# Patient Record
Sex: Female | Born: 1950 | Race: White | Hispanic: No | Smoking: Never smoker
Health system: Southern US, Community
[De-identification: ages and names within clinical notes are randomized; demographics above are authoritative.]

## PROBLEM LIST (undated history)

## (undated) DIAGNOSIS — C801 Malignant (primary) neoplasm, unspecified: Secondary | ICD-10-CM

## (undated) HISTORY — DX: Malignant (primary) neoplasm, unspecified: C80.1

## (undated) HISTORY — PX: ABDOMINAL HYSTERECTOMY: SHX81

## (undated) HISTORY — PX: KNEE ARTHROSCOPY: SUR90

## (undated) HISTORY — PX: OTHER SURGICAL HISTORY: SHX169

---

## 2003-12-22 ENCOUNTER — Encounter: Payer: Self-pay | Admitting: Family Medicine

## 2005-09-10 ENCOUNTER — Ambulatory Visit: Payer: Self-pay | Admitting: Family Medicine

## 2005-09-16 ENCOUNTER — Ambulatory Visit: Payer: Self-pay | Admitting: Family Medicine

## 2005-09-23 ENCOUNTER — Ambulatory Visit: Payer: Self-pay | Admitting: Family Medicine

## 2005-10-01 ENCOUNTER — Ambulatory Visit: Payer: Self-pay | Admitting: Family Medicine

## 2005-11-02 ENCOUNTER — Ambulatory Visit: Payer: Self-pay | Admitting: Family Medicine

## 2005-11-24 ENCOUNTER — Ambulatory Visit: Payer: Self-pay | Admitting: Family Medicine

## 2006-01-04 ENCOUNTER — Ambulatory Visit: Payer: Self-pay | Admitting: Family Medicine

## 2006-01-21 ENCOUNTER — Ambulatory Visit: Payer: Self-pay | Admitting: Family Medicine

## 2006-01-29 ENCOUNTER — Ambulatory Visit: Payer: Self-pay | Admitting: Family Medicine

## 2006-04-07 ENCOUNTER — Ambulatory Visit: Payer: Self-pay | Admitting: Family Medicine

## 2006-05-21 ENCOUNTER — Ambulatory Visit: Payer: Self-pay | Admitting: Family Medicine

## 2006-06-11 ENCOUNTER — Ambulatory Visit: Payer: Self-pay | Admitting: Family Medicine

## 2006-06-17 ENCOUNTER — Ambulatory Visit: Payer: Self-pay | Admitting: Family Medicine

## 2006-06-21 ENCOUNTER — Ambulatory Visit: Payer: Self-pay | Admitting: Family Medicine

## 2006-07-05 ENCOUNTER — Ambulatory Visit: Payer: Self-pay | Admitting: Family Medicine

## 2006-09-28 DIAGNOSIS — F339 Major depressive disorder, recurrent, unspecified: Secondary | ICD-10-CM | POA: Insufficient documentation

## 2006-09-28 DIAGNOSIS — R5382 Chronic fatigue, unspecified: Secondary | ICD-10-CM | POA: Insufficient documentation

## 2006-09-28 DIAGNOSIS — G9332 Myalgic encephalomyelitis/chronic fatigue syndrome: Secondary | ICD-10-CM | POA: Insufficient documentation

## 2006-09-28 DIAGNOSIS — R55 Syncope and collapse: Secondary | ICD-10-CM | POA: Insufficient documentation

## 2006-09-28 DIAGNOSIS — N951 Menopausal and female climacteric states: Secondary | ICD-10-CM | POA: Insufficient documentation

## 2006-09-28 DIAGNOSIS — F329 Major depressive disorder, single episode, unspecified: Secondary | ICD-10-CM

## 2006-09-28 DIAGNOSIS — I10 Essential (primary) hypertension: Secondary | ICD-10-CM | POA: Insufficient documentation

## 2007-06-17 ENCOUNTER — Encounter: Payer: Self-pay | Admitting: Family Medicine

## 2007-07-07 LAB — CONVERTED CEMR LAB
HDL: 48 mg/dL
Triglycerides: 139 mg/dL

## 2007-08-19 ENCOUNTER — Ambulatory Visit: Payer: Self-pay | Admitting: Family Medicine

## 2007-08-19 DIAGNOSIS — E785 Hyperlipidemia, unspecified: Secondary | ICD-10-CM | POA: Insufficient documentation

## 2007-08-29 ENCOUNTER — Telehealth: Payer: Self-pay | Admitting: Family Medicine

## 2007-08-29 ENCOUNTER — Ambulatory Visit: Payer: Self-pay | Admitting: Family Medicine

## 2007-08-29 DIAGNOSIS — R1013 Epigastric pain: Secondary | ICD-10-CM | POA: Insufficient documentation

## 2007-08-30 ENCOUNTER — Encounter: Payer: Self-pay | Admitting: Family Medicine

## 2007-08-30 LAB — CONVERTED CEMR LAB
Amylase: 76 units/L (ref 0–105)
BUN: 23 mg/dL (ref 6–23)
CO2: 23 meq/L (ref 19–32)
Calcium: 9.5 mg/dL (ref 8.4–10.5)
Chloride: 103 meq/L (ref 96–112)
Creatinine, Ser: 0.8 mg/dL (ref 0.40–1.20)
Hemoglobin: 14.9 g/dL (ref 12.0–15.0)
Lymphocytes Relative: 28 % (ref 12–46)
MCHC: 31.8 g/dL (ref 30.0–36.0)
Monocytes Absolute: 0.8 10*3/uL — ABNORMAL HIGH (ref 0.2–0.7)
Monocytes Relative: 7 % (ref 3–11)
Neutro Abs: 6.9 10*3/uL (ref 1.7–7.7)
RBC: 5.08 M/uL (ref 3.87–5.11)

## 2007-09-13 ENCOUNTER — Ambulatory Visit: Payer: Self-pay | Admitting: Family Medicine

## 2007-09-13 DIAGNOSIS — R42 Dizziness and giddiness: Secondary | ICD-10-CM | POA: Insufficient documentation

## 2007-09-14 ENCOUNTER — Encounter: Payer: Self-pay | Admitting: Family Medicine

## 2007-09-15 ENCOUNTER — Telehealth: Payer: Self-pay | Admitting: Family Medicine

## 2007-09-15 ENCOUNTER — Encounter: Payer: Self-pay | Admitting: Family Medicine

## 2008-01-26 ENCOUNTER — Ambulatory Visit: Payer: Self-pay | Admitting: Family Medicine

## 2008-01-26 LAB — CONVERTED CEMR LAB: Glucose, Bld: 83 mg/dL

## 2008-04-25 ENCOUNTER — Ambulatory Visit: Payer: Self-pay | Admitting: Family Medicine

## 2008-05-31 ENCOUNTER — Telehealth: Payer: Self-pay | Admitting: Family Medicine

## 2008-08-22 ENCOUNTER — Telehealth: Payer: Self-pay | Admitting: Family Medicine

## 2008-08-29 ENCOUNTER — Other Ambulatory Visit: Admission: RE | Admit: 2008-08-29 | Discharge: 2008-08-29 | Payer: Self-pay | Admitting: Family Medicine

## 2008-08-29 ENCOUNTER — Encounter: Payer: Self-pay | Admitting: Family Medicine

## 2008-08-29 ENCOUNTER — Ambulatory Visit: Payer: Self-pay | Admitting: Family Medicine

## 2008-08-29 DIAGNOSIS — N950 Postmenopausal bleeding: Secondary | ICD-10-CM | POA: Insufficient documentation

## 2008-08-31 LAB — CONVERTED CEMR LAB: Pap Smear: NORMAL

## 2008-09-05 ENCOUNTER — Telehealth: Payer: Self-pay | Admitting: Family Medicine

## 2009-05-13 ENCOUNTER — Ambulatory Visit: Payer: Self-pay | Admitting: Family Medicine

## 2009-05-13 DIAGNOSIS — G4700 Insomnia, unspecified: Secondary | ICD-10-CM | POA: Insufficient documentation

## 2010-03-05 ENCOUNTER — Ambulatory Visit: Payer: Self-pay | Admitting: Family Medicine

## 2010-10-13 ENCOUNTER — Encounter: Admission: RE | Admit: 2010-10-13 | Discharge: 2010-10-13 | Payer: Self-pay | Admitting: Family Medicine

## 2010-10-13 ENCOUNTER — Ambulatory Visit: Payer: Self-pay | Admitting: Family Medicine

## 2010-10-13 DIAGNOSIS — M79609 Pain in unspecified limb: Secondary | ICD-10-CM | POA: Insufficient documentation

## 2010-12-30 ENCOUNTER — Ambulatory Visit
Admission: RE | Admit: 2010-12-30 | Discharge: 2010-12-30 | Payer: Self-pay | Source: Home / Self Care | Attending: Family Medicine | Admitting: Family Medicine

## 2011-01-20 NOTE — Assessment & Plan Note (Signed)
Summary: HTN, Mood   Vital Signs:  Patient profile:   60 year old [place] Height:      64.75 inches Weight:      182 pounds BMI:     30.63 Pulse rate:   89 / minute BP sitting:   147 / 85  (left arm) Cuff size:   large  Vitals Entered By: Kathlene November (March 05, 2010 9:16 AM) zCC: followup BP   Primary Care Provider:  Linford Arnold, C  CC:  followup BP.  History of Present Illness: Feels stressed and in a "funk". Sleep is OK. Plans on visiting a friend for about 6 months in [place]. Says stayed with the freind after her divorce and it really helped her spirts. She has been unable to find  a job for the last year and this has been stressful as well. Feels overwhelmed right now. Denis any suicidal or homicidal thoughts. Has been having more frequent HA over teh last month. Treating with Tyelnol. Had to take and excedrin yesterday but it did help.  sometimes will wake up with a HA. Usually frontal and throbbing.   Current Medications (verified): 1)  Lisinopril 20 Mg  Tabs (Lisinopril) .... Take 1 Tablet By Mouth Once A Day 2)  Metoprolol Tartrate 25 Mg  Tabs (Metoprolol Tartrate) .... Take 1 Tablet By Mouth Two Times A Day  Allergies (verified): No Known Drug Allergies  Comments:  Nurse/Medical Assistant: The patient's medications and allergies were reviewed with the patient and were updated in the Medication and Allergy Lists. Kathlene November (March 05, 2010 9:17 AM)  Physical Exam  General:  Well-developed,well-nourished,in no acute distress; alert,appropriate and cooperative throughout examination Head:  Normocephalic and atraumatic without obvious abnormalities. No apparent alopecia or balding. Lungs:  Normal respiratory effort, chest expands symmetrically. Lungs are clear to auscultation, no crackles or wheezes. Heart:  Normal rate and regular rhythm. S1 and S2 normal without gallop, murmur, click, rub or other extra sounds. No carotid bruits.  Skin:  no rashes.   Cervical  Nodes:  No lymphadenopathy noted Psych:  Cognition and judgment appear intact. Alert and cooperative with normal attention span and concentration. No apparent delusions, illusions, hallucinations   Impression & Recommendations:  Problem # 1:  HYPERTENSION, BENIGN SYSTEMIC (ICD-401.1) Assessment Deteriorated Not well controlled today and may be contributing to her HA so will increase her dose of lisinopril. Depressiin and stress may be contributing to her HA as well. Encourage more regular exercise for her BP and her mood.  Her updated medication list for this problem includes:    Lisinopril 40 Mg Tabs (Lisinopril) .Marland Kitchen... Take 1 tablet by mouth once a day    Metoprolol Tartrate 25 Mg Tabs (Metoprolol tartrate) .Marland Kitchen... Take 1 tablet by mouth two times a day  Problem # 2:  DEPRESSIVE DISORDER, NOS (ICD-311) Assessment: Deteriorated PHQ-9 score is 21 (severe). Dsicussed tx options. Will start with an SSRI. WArned of potential SE. Call if any concerns. She will be living out of town for 6 months so asked her to call in 3 weeks to give me an update on how she is doing overall and to f/u with me when she gets back in town.  May need dose adjusted.  Mood questionnair screen in negative.  The following medications were removed from the medication list:    Alprazolam 0.5 Mg Tabs (Alprazolam) .Marland Kitchen... Take 1 tablet by mouth once a day as needed anxiety    Trazodone Hcl 50 Mg Tabs (Trazodone hcl) .Marland Kitchen... 1/2-1  tab at bedtime by mouth. Her updated medication list for this problem includes:    Citalopram Hydrobromide 20 Mg Tabs (Citalopram hydrobromide) .Marland Kitchen... 1/2 tab by mouth for 1 week, then incrase to whole tab.  Complete Medication List: 1)  Lisinopril 40 Mg Tabs (Lisinopril) .... Take 1 tablet by mouth once a day 2)  Metoprolol Tartrate 25 Mg Tabs (Metoprolol tartrate) .... Take 1 tablet by mouth two times a day 3)  Citalopram Hydrobromide 20 Mg Tabs (Citalopram hydrobromide) .... 1/2 tab by mouth for 1 week,  then incrase to whole tab. Prescriptions: CITALOPRAM HYDROBROMIDE 20 MG TABS (CITALOPRAM HYDROBROMIDE) 1/2 tab by mouth for 1 week, then incrase to whole tab.  #30 x 1   Entered and Authorized by:   Nani Gasser MD   Signed by:   Nani Gasser MD on 03/05/2010   Method used:   Electronically to        [facility] [phone]* (retail)       [address] [place], [place]  [address]       Ph: [phone]       Fax: [phone]   RxID:   [id number] METOPROLOL TARTRATE 25 MG  TABS (METOPROLOL TARTRATE) Take 1 tablet by mouth two times a day  #90 x 2   Entered and Authorized by:   Nani Gasser MD   Signed by:   Nani Gasser MD on 03/05/2010   Method used:   Electronically to        [facility] [phone]* (retail)       [address]       [place], [place]  [address]       Ph: [phone]       Fax: [phone]   RxID:   [id number] LISINOPRIL 40 MG TABS (LISINOPRIL) Take 1 tablet by mouth once a day  #90 x 2   Entered and Authorized by:   Nani Gasser MD   Signed by:   Nani Gasser MD on 03/05/2010   Method used:   Electronically to        [facility] [phone]* (retail)       [address]       [place], [place]  [address]       Ph: [phone]       Fax: [phone]   RxID:   [id number]

## 2011-01-20 NOTE — Letter (Signed)
Summary: Depression Questionnaire/[facility]  Depression Questionnaire/[facility]   Imported By: Lanelle Bal 03/11/2010 09:56:35  _____________________________________________________________________  External Attachment:    Type:   Image     Comment:   External Document

## 2011-01-20 NOTE — Assessment & Plan Note (Signed)
Summary: Lt. arm hurts and can't move it   Vital Signs:  Patient profile:   60 year old female Height:      64.75 inches Weight:      181 pounds Pulse rate:   75 / minute BP sitting:   164 / 92  (right arm) Cuff size:   regular  Vitals Entered By: Avon Gully CMA, Duncan Dull) (October 13, 2010 11:40 AM) CC: left arm pain started hurting Friday progressivly got worse through the weekend, cant straighten   Primary Care Provider:  Linford Arnold, C  CC:  left arm pain started hurting Friday progressivly got worse through the weekend and cant straighten.  History of Present Illness: left arm pain started hurting Friday progressivly got worse through the weekend, cant straighten. Using some Bengay on it on Friday. then o Sat used Icey hot. It has gotten progressively worsel.  Tried heat nad ice . Also used Excedrin 2 last night adn 2 this AM.  - not hleping.  Doesn't feel like a pulled muscle. No trauma, lifting etc.  Worse if lays on her back or side.  No numbness or tingling in the hand. Pain on outside of arm between the shoulder and elbow. No neck pain. In the poast has had occ pain but usually the bengay works.   Current Medications (verified): 1)  Lisinopril 40 Mg Tabs (Lisinopril) .... Take 1 Tablet By Mouth Once A Day 2)  Metoprolol Tartrate 25 Mg  Tabs (Metoprolol Tartrate) .... Take 1 Tablet By Mouth Two Times A Day 3)  Citalopram Hydrobromide 20 Mg Tabs (Citalopram Hydrobromide) .... 1/2 Tab By Mouth For 1 Week, Then Incrase To Whole Tab.  Allergies (verified): No Known Drug Allergies  Comments:  Nurse/Medical Assistant: The patient's medications and allergies were reviewed with the patient and were updated in the Medication and Allergy Lists. Avon Gully CMA, Duncan Dull) (October 13, 2010 11:41 AM)  Physical Exam  General:  Well-developed,well-nourished,in no acute distress; alert,appropriate and cooperative throughout examination Msk:  Slight tender over the upper trap. NEck  wtih NROM.  Hand and wrist with NROM. Thumb and first finger strength 5/5.  Has her arm flexed over her abdomen.  She is able to slighly shrug her left shoulder. Unable to abduct or adduct. Able to extend elbow about 20 degrees from a flexed 90 degree position. She is nontender over her Left humerus.   Pulses:  Raidla pulse on the left 2+  Skin:  Intact without suspicious lesions or rashes Psych:  Cognition and judgment appear intact. Alert and cooperative with normal attention span and concentration. No apparent delusions, illusions, hallucinations   Impression & Recommendations:  Problem # 1:  ARM PAIN, LEFT (ICD-729.5)  Unclear etiology. Her pain is severe and her mobility is very limited. Consider bone tumor vs tendon rupture /straing vs pain radiating from her shoulder or elbow. Her exam is so limited because of pain that it is really not helpful. Will get xray and trial of NSAIDs and muscle relaxers. Also givne sling for support since her arm raised across her abdomen is the most comfortable position.  Orders: T-DG Humerus*L* (16109) Slings- All Types (U0454) Admin of Therapeutic Inj  intramuscular or subcutaneous (09811) Ketorolac-Toradol 15mg  (B1478)  Complete Medication List: 1)  Lisinopril 40 Mg Tabs (Lisinopril) .... Take 1 tablet by mouth once a day 2)  Metoprolol Tartrate 25 Mg Tabs (Metoprolol tartrate) .... Take 1 tablet by mouth two times a day 3)  Citalopram Hydrobromide 20 Mg Tabs (Citalopram hydrobromide) .Marland KitchenMarland KitchenMarland Kitchen  1/2 tab by mouth for 1 week, then incrase to whole tab. 4)  Flexeril 10 Mg Tabs (Cyclobenzaprine hcl) .... 1/2 to 1 tab by mouth three times a day as needed muscle spasm. 5)  Naproxen 500 Mg Tabs (Naproxen) .... Take 1 tablet by mouth two times a day  Patient Instructions: 1)  Trial of antiinflammatory and muscle relaxers.  2)  Use the sling for support 3)  We will call you with the xray results.  Prescriptions: NAPROXEN 500 MG TABS (NAPROXEN) Take 1 tablet by  mouth two times a day  #60 x 0   Entered and Authorized by:   Nani Gasser MD   Signed by:   Nani Gasser MD on 10/13/2010   Method used:   Electronically to        [facility] [phone]* (retail)       [address]       [place], [place]  [address]       Ph: [phone]       Fax: [phone]   RxID:   [id number] FLEXERIL 10 MG TABS (CYCLOBENZAPRINE HCL) 1/2 to 1 tab by mouth three times a day as needed muscle spasm.  #30 x 0   Entered and Authorized by:   Nani Gasser MD   Signed by:   Nani Gasser MD on 10/13/2010   Method used:   Electronically to        [facility] [phone]* (retail)       [address]       [place], [place]  [address]       Ph: [phone]       Fax: [phone]   RxID:   [id number]    Medication Administration  Injection # 1:    Medication: Ketorolac-Toradol 15mg     Diagnosis: ARM PAIN, LEFT (ICD-729.5)    Route: IM    Site: RUOQ gluteus    Exp Date: 02/19/2012    Lot #: [id number]    Mfr: hospira    Comments: 60mg /ml given    Patient tolerated injection without complications    Given by: Avon Gully CMA, Duncan Dull) (October 13, 2010 12:05 PM)  Orders Added: 1)  T-DG Humerus*L* [73060] 2)  Est. Patient Level III [93235] 3)  Slings- All Types [A4565] 4)  Admin of Therapeutic Inj  intramuscular or subcutaneous [96372] 5)  Ketorolac-Toradol 15mg  [J1885]

## 2011-01-22 NOTE — Assessment & Plan Note (Signed)
Summary: Mole Removal Under Rt arm   Vital Signs:  Patient profile:   60 year old female Height:      64.75 inches Weight:      179 pounds Pulse rate:   90 / minute BP sitting:   173 / 96  (right arm) Cuff size:   regular  Vitals Entered By: Avon Gully CMA, (AAMA) (December 30, 2010 11:13 AM) CC: mole oor skin tag removed under rt axilla   CC:  mole oor skin tag removed under rt axilla.  Current Medications (verified): 1)  Lisinopril 40 Mg Tabs (Lisinopril) .... Take 1 Tablet By Mouth Once A Day 2)  Metoprolol Tartrate 25 Mg  Tabs (Metoprolol Tartrate) .... Take 1 Tablet By Mouth Two Times A Day 3)  Citalopram Hydrobromide 20 Mg Tabs (Citalopram Hydrobromide) .... 1/2 Tab By Mouth For 1 Week, Then Incrase To Whole Tab.  Allergies (verified): No Known Drug Allergies  Comments:  Nurse/Medical Assistant: The patient's medications and allergies were reviewed with the patient and were updated in the Medication and Allergy Lists. Avon Gully CMA, Duncan Dull) (December 30, 2010 11:14 AM)   Impression & Recommendations:  Problem # 1:  HYPERTENSION, BENIGN SYSTEMIC (ICD-401.1) She is extremely nervous. Home BPs running in the mid 130s and she has brought inher home machine and it is accurate. I think the high BP is just from being nervous today.  Her updated medication list for this problem includes:    Lisinopril 40 Mg Tabs (Lisinopril) .Marland Kitchen... Take 1 tablet by mouth once a day    Metoprolol Tartrate 25 Mg Tabs (Metoprolol tartrate) .Marland Kitchen... Take 1 tablet by mouth two times a day  Complete Medication List: 1)  Lisinopril 40 Mg Tabs (Lisinopril) .... Take 1 tablet by mouth once a day 2)  Metoprolol Tartrate 25 Mg Tabs (Metoprolol tartrate) .... Take 1 tablet by mouth two times a day 3)  Citalopram Hydrobromide 20 Mg Tabs (Citalopram hydrobromide) .... 1/2 tab by mouth for 1 week, then incrase to whole tab.  Other Orders: Skin Tags Up to 15 any area (11200)   Orders Added: 1)   Skin Tags Up to 15 any area [11200]    Procedure Note  Skin Tag Removal: Indication: inflamed lesion  Procedure # 1: skin tag removal    Location: right axilla    # lesions removed: 1    Instrument used: #10 blade.     Anesthesia: 1% lidocaine w/epinephrine    Comment: HIt the skin tag when picked her dog up. It ha been very swollen, tender and has turned black since then. I did use electrocautery to acheive hemostasis.   Cleaned and prepped with: alcohol and betadine Wound dressing: bacitracin and pressure dressing

## 2011-03-09 ENCOUNTER — Ambulatory Visit (INDEPENDENT_AMBULATORY_CARE_PROVIDER_SITE_OTHER): Payer: Self-pay | Admitting: Family Medicine

## 2011-03-09 ENCOUNTER — Encounter: Payer: Self-pay | Admitting: Family Medicine

## 2011-03-09 DIAGNOSIS — I10 Essential (primary) hypertension: Secondary | ICD-10-CM

## 2011-03-19 NOTE — Assessment & Plan Note (Signed)
Summary: HTN   Vital Signs:  Patient profile:   60 year old female Height:      64.75 inches Weight:      180 pounds Pulse rate:   77 / minute BP sitting:   159 / 91  (right arm) Cuff size:   regular  Vitals Entered By: Avon Gully CMA, Duncan Dull) (March 09, 2011 8:59 AM) CC: F/u BP, not taking meds, Hypertension Management   Primary Care Provider:  Linford Arnold, C  CC:  F/u BP, not taking meds, and Hypertension Management.  History of Present Illness: Last week felt bad at [facility]. Got a severe HA and felt bad. Went home and checked BP and it was 180/105. Stopped her BP meds last summer becuase she felt good. Still has th eold bottles with her today.   Hypertension History:      She denies headache, chest pain, palpitations, dyspnea with exertion, orthopnea, PND, peripheral edema, visual symptoms, neurologic problems, syncope, and side effects from treatment.  She notes no problems with any antihypertensive medication side effects.        Positive major cardiovascular risk factors include female age 67 years old or older, hyperlipidemia, and hypertension.  Negative major cardiovascular risk factors include negative family history for ischemic heart disease and non-tobacco-user status.        Further assessment for target organ damage reveals no history of ASHD, cardiac end-organ damage (CHF/LVH), stroke/TIA, peripheral vascular disease, renal insufficiency, or hypertensive retinopathy.     Current Medications (verified): 1)  Lisinopril 40 Mg Tabs (Lisinopril) .... Take 1 Tablet By Mouth Once A Day 2)  Metoprolol Tartrate 25 Mg  Tabs (Metoprolol Tartrate) .... Take 1 Tablet By Mouth Two Times A Day 3)  Citalopram Hydrobromide 20 Mg Tabs (Citalopram Hydrobromide) .... 1/2 Tab By Mouth For 1 Week, Then Incrase To Whole Tab.  Allergies (verified): No Known Drug Allergies  Comments:  Nurse/Medical Assistant: The patient's medications and allergies were reviewed with the patient and  were updated in the Medication and Allergy Lists. Avon Gully CMA, Duncan Dull) (March 09, 2011 9:00 AM)  Physical Exam  General:  Well-developed,well-nourished,in no acute distress; alert,appropriate and cooperative throughout examination Lungs:  Normal respiratory effort, chest expands symmetrically. Lungs are clear to auscultation, no crackles or wheezes. Heart:  Normal rate and regular rhythm. S1 and S2 normal without gallop, murmur, click, rub or other extra sounds.   Impression & Recommendations:  Problem # 1:  HYPERTENSION, BENIGN SYSTEMIC (ICD-401.1) BP is still high. Restart medications. Discussed why it is important to take medication to control BP. She agrees to restart. Note she is without insurance right now.  Her updated medication list for this problem includes:    Lisinopril 40 Mg Tabs (Lisinopril) .Marland Kitchen... Take 1 tablet by mouth once a day    Metoprolol Tartrate 25 Mg Tabs (Metoprolol tartrate) .Marland Kitchen... Take 1 tablet by mouth two times a day  Complete Medication List: 1)  Lisinopril 40 Mg Tabs (Lisinopril) .... Take 1 tablet by mouth once a day 2)  Metoprolol Tartrate 25 Mg Tabs (Metoprolol tartrate) .... Take 1 tablet by mouth two times a day  Hypertension Assessment/Plan:      The patient's hypertensive risk group is category B: At least one risk factor (excluding diabetes) with no target organ damage.  Her calculated 10 year risk of coronary heart disease is 15 %.  Today's blood pressure is 159/91.  Her blood pressure goal is < 140/90.  Patient Instructions: 1)  Call me in  one month with BP readings 2)  Start the lisinopirl two times a day for one week. Then add the metoprolol two times a day to your regimen.   3)  Then f/u with me in about 2-3 months.   Orders Added: 1)  Est. Patient Level II [04540]    Prevention & Chronic Care Immunizations   Influenza vaccine: Not documented    Tetanus booster: Not documented    Pneumococcal vaccine: Not documented    H.  zoster vaccine: Not documented  Colorectal Screening   Hemoccult: Not documented    Colonoscopy: Not documented  Other Screening   Pap smear: Normal  (08/31/2008)   Pap smear due: 08/2010    Mammogram: Not documented    DXA bone density scan: Not documented   Smoking status: never  (06/17/2007)  Lipids   Total Cholesterol: 232  (07/07/2007)   LDL: 156  (07/07/2007)   LDL Direct: Not documented   HDL: 48  (07/07/2007)   Triglycerides: 139  (07/07/2007)    SGOT (AST): 15  (08/30/2007)   SGPT (ALT): 16  (08/30/2007)   Alkaline phosphatase: 89  (08/30/2007)   Total bilirubin: 0.4  (08/30/2007)  Hypertension   Last Blood Pressure: 159 / 91  (03/09/2011)   Serum creatinine: 0.80  (08/30/2007)   Serum potassium 4.5  (08/30/2007)  Self-Management Support :    Hypertension self-management support: Not documented    Lipid self-management support: Not documented

## 2011-11-09 ENCOUNTER — Telehealth: Payer: Self-pay | Admitting: *Deleted

## 2011-11-10 ENCOUNTER — Ambulatory Visit (INDEPENDENT_AMBULATORY_CARE_PROVIDER_SITE_OTHER): Payer: Self-pay | Admitting: Family Medicine

## 2011-11-10 ENCOUNTER — Encounter: Payer: Self-pay | Admitting: Family Medicine

## 2011-11-10 VITALS — BP 159/90 | HR 86 | Ht 65.0 in | Wt 175.0 lb

## 2011-11-10 DIAGNOSIS — F329 Major depressive disorder, single episode, unspecified: Secondary | ICD-10-CM

## 2011-11-10 DIAGNOSIS — F32A Depression, unspecified: Secondary | ICD-10-CM

## 2011-11-10 DIAGNOSIS — I10 Essential (primary) hypertension: Secondary | ICD-10-CM

## 2011-11-10 DIAGNOSIS — F3289 Other specified depressive episodes: Secondary | ICD-10-CM

## 2011-11-10 MED ORDER — CITALOPRAM HYDROBROMIDE 20 MG PO TABS: 20.0000 mg | ORAL_TABLET | Freq: Every day | ORAL | Status: DC

## 2011-11-10 MED ORDER — BISOPROLOL-HYDROCHLOROTHIAZIDE 5-6.25 MG PO TABS: 1.0000 | ORAL_TABLET | Freq: Every day | ORAL | Status: DC

## 2011-11-10 NOTE — Progress Notes (Signed)
  Subjective:    Patient ID: [name], female    DOB: Oct 23, 1951, 60 y.o.   MRN: [id number]  HPI Patient's her followup of hypertension. She reports that yesterday she was in the ED because of elevated blood pressure. The systolic was greater than 250 and the  diastolic was greater than 120. She  almost or was about to pass out and was very dizzy and very lightheaded. According to her daughter she's had these spells before when she's under stress stress like she's is right now. such that her daughter and her husband are living with her and are no  longer working. She is retired and her retirement funds have  to pay for the household. She reports early morning wakening looking up at the ceiling loss of interest in day-to-day activity feeling as she puts it  "the worldsucks"  and easily gets fixed on small concerns and items. She denies 1 to herself or others.  Another problem that she has is taking her blood pressure medicine lisinopril is 20 mg she is to take 2 tablets a day and metoprolol as one tablet twice a day that's once he often forgets to take in the evening and sometimes it is in only take one of the simple during the day. Review of Systems    she's feeling much better right now doesn't report any problem.  BP 159/90  Pulse 86  Ht 5\' 5"  (1.651 m)  Wt 175 lb (79.379 kg)  BMI 29.12 kg/m2  SpO2 99%  Objective:   Physical Exam  Constitutional: She is oriented to person, place, and time. She appears well-developed and well-nourished.  HENT:  Head: Normocephalic.  Neck: Neck supple.  Cardiovascular: Normal rate, regular rhythm and normal heart sounds.   Pulmonary/Chest: Effort normal and breath sounds normal. No respiratory distress. She has no wheezes.  Neurological: She is alert and oriented to person, place, and time.  Psychiatric: Her speech is normal and behavior is normal. Judgment and thought content normal. Her mood appears anxious. Her affect is blunt. Cognition and  memory are normal. She exhibits a depressed mood.          Assessment & Plan:  Anxiety and depression we'll place her on Celexa 20 mg one tablet a day have her follow Dr. Eppie Gibson in 6-8 weeks. Hypertension troubles compliance with medication we'll try some fine her medication regimen and also provide more therapeutic treatment as well. We'll stop the Toprol place on Ziac 5 which is a diuretic will recommend increase to 10 if blood pressure still requires it on the Lotensin she keep taking the 20 mg once a day for blood pressure and we can increase to 2 tablets daily if needed. Pharmacy target was checked to make sure what was on the 4 dollar plan. Need for flu vaccination patient off flu vaccine she declines.

## 2011-11-10 NOTE — Patient Instructions (Signed)
Anxiety and Panic Attacks Your caregiver has informed you that you are having an anxiety or panic attack. There may be many forms of this. Most of the time these attacks come suddenly and without warning. They come at any time of day, including periods of sleep, and at any time of life. They may be strong and unexplained. Although panic attacks are very scary, they are physically harmless. Sometimes the cause of your anxiety is not known. Anxiety is a protective mechanism of the body in its fight or flight mechanism. Most of these perceived danger situations are actually nonphysical situations (such as anxiety over losing a job). CAUSES  The causes of an anxiety or panic attack are many. Panic attacks may occur in otherwise healthy people given a certain set of circumstances. There may be a genetic cause for panic attacks. Some medications may also have anxiety as a side effect. SYMPTOMS  Some of the most common feelings are:  Intense terror.   Dizziness, feeling faint.   Hot and cold flashes.   Fear of going crazy.   Feelings that nothing is real.   Sweating.   Shaking.   Chest pain or a fast heartbeat (palpitations).   Smothering, choking sensations.   Feelings of impending doom and that death is near.   Tingling of extremities, this may be from over-breathing.   Altered reality (derealization).   Being detached from yourself (depersonalization).  Several symptoms can be present to make up anxiety or panic attacks. DIAGNOSIS  The evaluation by your caregiver will depend on the type of symptoms you are experiencing. The diagnosis of anxiety or panic attack is made when no physical illness can be determined to be a cause of the symptoms. TREATMENT  Treatment to prevent anxiety and panic attacks may include:  Avoidance of circumstances that cause anxiety.   Reassurance and relaxation.   Regular exercise.   Relaxation therapies, such as yoga.   Psychotherapy with a  psychiatrist or therapist.   Avoidance of caffeine, alcohol and illegal drugs.   Prescribed medication.  SEEK IMMEDIATE MEDICAL CARE IF:   You experience panic attack symptoms that are different than your usual symptoms.   You have any worsening or concerning symptoms.  Document Released: 12/07/2005 Document Revised: 08/19/2011 Document Reviewed: 04/10/2010 [facility] Patient Information 2012 [place], [place].Depression  Depression is a strong emotion of feeling unhappy that can last for weeks, months, or even longer. Depression causes problems with the ability to function in life. It upsets your:   Relationships.   Sleep.   Eating habits.   Work habits.  HOME CARE  Take all medicine as told by your doctor.   Talk with a therapist, counselor, or friend.   Eat a healthy diet.   Exercise regularly.   Do not drink alcohol or use drugs.  GET HELP RIGHT AWAY IF: You start to have thoughts about hurting yourself or others. MAKE SURE YOU:  Understand these instructions.   Will watch your condition.   Will get help right away if you are not doing well or get worse.  Document Released: 01/09/2011 Document Revised: 08/19/2011 Document Reviewed: 01/09/2011 [facility] Patient Information 2012 [place], [place].

## 2011-11-23 ENCOUNTER — Ambulatory Visit: Payer: Self-pay | Admitting: Family Medicine

## 2012-01-14 ENCOUNTER — Other Ambulatory Visit: Payer: Self-pay | Admitting: *Deleted

## 2012-01-14 MED ORDER — LISINOPRIL 20 MG PO TABS: 40.0000 mg | ORAL_TABLET | Freq: Every day | ORAL | Status: DC

## 2013-01-20 ENCOUNTER — Other Ambulatory Visit: Payer: Self-pay | Admitting: Family Medicine

## 2013-02-02 ENCOUNTER — Ambulatory Visit: Payer: Self-pay | Admitting: Family Medicine

## 2013-02-07 ENCOUNTER — Encounter: Payer: Self-pay | Admitting: Family Medicine

## 2013-02-07 ENCOUNTER — Ambulatory Visit (INDEPENDENT_AMBULATORY_CARE_PROVIDER_SITE_OTHER): Payer: Self-pay | Admitting: Family Medicine

## 2013-02-07 VITALS — BP 142/89 | HR 72 | Ht 65.0 in | Wt 184.0 lb

## 2013-02-07 DIAGNOSIS — I10 Essential (primary) hypertension: Secondary | ICD-10-CM

## 2013-02-07 MED ORDER — LISINOPRIL 20 MG PO TABS: 40.0000 mg | ORAL_TABLET | Freq: Every day | ORAL | Status: DC

## 2013-02-07 MED ORDER — BISOPROLOL-HYDROCHLOROTHIAZIDE 5-6.25 MG PO TABS: 1.0000 | ORAL_TABLET | Freq: Every day | ORAL | Status: DC

## 2013-02-07 NOTE — Progress Notes (Signed)
  Subjective:    Patient ID: Amy Wilson, female    DOB: 1951-11-09, 62 y.o.   MRN: [id number]  HPI HTN-  Pt denies chest pain, SOB, dizziness, or heart palpitations.  Taking meds as directed w/o problems.  Denies medication side effects.  5 min spent with pt.    Review of Systems     Objective:   Physical Exam  Constitutional: She is oriented to person, place, and time. She appears well-developed and well-nourished.  HENT:  Head: Normocephalic and atraumatic.  Neck: Neck supple. No thyromegaly present.  Cardiovascular: Normal rate, regular rhythm and normal heart sounds.   Pulmonary/Chest: Effort normal and breath sounds normal.  Lymphadenopathy:    She has no cervical adenopathy.  Neurological: She is alert and oriented to person, place, and time.  Skin: Skin is warm and dry.  Psychiatric: She has a normal mood and affect. Her behavior is normal.          Assessment & Plan:  HTN - Uncontrolled. BP si borderline.  Make sure eating low salt diet and getting some regular exercise. She is taking her medications consistently. Will recheck at followup appointment at that time she still not well controlled we will need to adjust her regimen. She can also check it periodically at the pharmacy and call me with those results. will check BMP when able. Explained her to really important for me to check her for kidneys and potassium level on her particular medication regimen. We could certainly hold off on checking cholesterol etc. until later. Unfortunately she doesn't have insurance and so she really doesn't want to have labwork drawn because she can't afford to pay for it. She would also like a prescription is written for year because it's a difficult for her to come in. Technically she is retired and only 43 and has known type of health insurance or mainstay of income at this point in time. Prescriptions written for 1 year but hopefully when she gets Medicare we can get her back on track  with all for preventive care.

## 2013-02-14 LAB — BASIC METABOLIC PANEL WITH GFR
Calcium: 9.5 mg/dL (ref 8.4–10.5)
Creat: 0.91 mg/dL (ref 0.50–1.10)
GFR, Est Non African American: 67 mL/min

## 2013-06-19 ENCOUNTER — Encounter: Payer: Self-pay | Admitting: Family Medicine

## 2013-06-19 ENCOUNTER — Ambulatory Visit (INDEPENDENT_AMBULATORY_CARE_PROVIDER_SITE_OTHER): Payer: Self-pay | Admitting: Family Medicine

## 2013-06-19 VITALS — BP 132/84 | HR 89 | Ht 65.0 in | Wt 187.0 lb

## 2013-06-19 DIAGNOSIS — L820 Inflamed seborrheic keratosis: Secondary | ICD-10-CM

## 2013-06-19 NOTE — Progress Notes (Signed)
  Subjective:    Patient ID: Amy Wilson, female    DOB: 25-Jul-1951, 62 y.o.   MRN: [id number]  HPI Large mole on her right side below the axilla for several years. She said she only became more concerned about it in the last 2 months where it has become more itchy and she says that we'll actually feels sore at times especially when she first wakes up in the morning. It is close to where her bra strap rubs the area. She has not noticed any bleeding from the lesion.   Review of Systems     Objective:   Physical Exam  She has a large 2 x 3 cm oval-shaped thick and darkly pigmented irregular lesion on her right upper back near the axilla.      Assessment & Plan:  Atypical nevus-most consistent with a seborrheic keratosis but because of the increased itching and soreness I am concerned and feel it is appropriate to do a shave biopsy for further evaluation.  Shave Biopsy Procedure Note  Pre-operative Diagnosis: Suspicious lesion, possibel seb keratosis  Post-operative Diagnosis: same  Locations:right upper back near axilla  Indications: irritated, sore, itching  Anesthesia: Lidocaine 1% with epinephrine without added sodium bicarbonate  Procedure Details  History of allergy to iodine: no  Patient informed of the risks (including bleeding and infection) and benefits of the  procedure and Verbal informed consent obtained.  The lesion and surrounding area were given a sterile prep using betadyne and draped in the usual sterile fashion. A scalpel was used to shave an area of skin approximately 2cm by 3cm.  Hemostasis achieved with alumuninum chloride. Antibiotic ointment and a sterile dressing applied.  The specimen was sent for pathologic examination. The patient tolerated the procedure well.  EBL: 0.25 ml  Findings: Await pathology  Condition: Stable  Complications: none.  Plan: 1. Instructed to keep the wound dry and covered for 24-48h and clean thereafter. 2. Warning  signs of infection were reviewed.   3. Recommended that the patient use OTC acetaminophen as needed for pain.  4. Return prn

## 2013-06-19 NOTE — Addendum Note (Signed)
Addended by: Deno Etienne on: 06/19/2013 05:08 PM   Modules accepted: Orders

## 2013-06-19 NOTE — Patient Instructions (Addendum)
He can remove the bandage tomorrow. Clean with regular soap and water in the shower and pat dry. Apply antibacterial ointment for the next 2-3 days and keep covered but doesn't mess up her clothing. After that you can just apply Vaseline. Do not use any peroxide, or alcohol products. Call if any sign of infection. It should slowly scab over. We will call you with the biopsy report hopefully in about a week. If you do not hear from [place] within 10 days then please call us back.

## 2014-09-01 ENCOUNTER — Other Ambulatory Visit: Payer: Self-pay | Admitting: Family Medicine

## 2014-09-01 LAB — LIPID PANEL
CHOLESTEROL: 191 mg/dL (ref 0–200)
HDL: 23 mg/dL — AB (ref 35–70)
LDL CALC: 143 mg/dL
TRIGLYCERIDES: 125 mg/dL (ref 40–160)

## 2014-09-01 LAB — BASIC METABOLIC PANEL
CHLORIDE: 110 mmol/L
CREATININE: 0.8 mg/dL (ref 0.5–1.1)
POTASSIUM: 4 mmol/L (ref 3.4–5.3)
SODIUM: 142 mmol/L (ref 137–147)

## 2014-09-01 LAB — VITAMIN B12: VITAMIN B12 BIND CAP: 934

## 2014-09-01 LAB — FOLATE: Folate: 6.4

## 2014-09-01 LAB — CBC AND DIFFERENTIAL
Hemoglobin: 12.7 g/dL (ref 12.0–16.0)
WBC: 5.1 10^3/mL

## 2014-09-01 LAB — TSH: TSH: 5.76 u[IU]/mL (ref 0.41–5.90)

## 2014-09-01 LAB — HEPATIC FUNCTION PANEL
ALT: 54 U/L — AB (ref 7–35)
AST: 75 U/L — AB (ref 13–35)

## 2014-09-03 ENCOUNTER — Other Ambulatory Visit: Payer: Self-pay | Admitting: *Deleted

## 2014-09-03 MED ORDER — LISINOPRIL 20 MG PO TABS: 40.0000 mg | ORAL_TABLET | Freq: Every day | ORAL | Status: DC

## 2014-09-03 MED ORDER — BISOPROLOL-HYDROCHLOROTHIAZIDE 5-6.25 MG PO TABS: 1.0000 | ORAL_TABLET | Freq: Every day | ORAL | Status: DC

## 2014-09-03 NOTE — Telephone Encounter (Signed)
Amy Wilson called today and a refill for 15 pills was given til she was able to have a follow up on appt on Friday of this week. She said she knew she had not been in for a while. Margette Fast, CMA

## 2014-09-07 ENCOUNTER — Ambulatory Visit (INDEPENDENT_AMBULATORY_CARE_PROVIDER_SITE_OTHER): Payer: Commercial Managed Care - HMO | Admitting: Family Medicine

## 2014-09-07 ENCOUNTER — Encounter: Payer: Self-pay | Admitting: Family Medicine

## 2014-09-07 VITALS — BP 155/85 | HR 69 | Ht 65.0 in | Wt 184.0 lb

## 2014-09-07 DIAGNOSIS — R55 Syncope and collapse: Secondary | ICD-10-CM

## 2014-09-07 DIAGNOSIS — I10 Essential (primary) hypertension: Secondary | ICD-10-CM

## 2014-09-07 MED ORDER — BISOPROLOL-HYDROCHLOROTHIAZIDE 10-6.25 MG PO TABS: 1.0000 | ORAL_TABLET | Freq: Every day | ORAL | Status: DC
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
## 2014-10-05 ENCOUNTER — Encounter: Payer: Self-pay | Admitting: Family Medicine

## 2014-10-05 ENCOUNTER — Ambulatory Visit (INDEPENDENT_AMBULATORY_CARE_PROVIDER_SITE_OTHER): Payer: Commercial Managed Care - HMO | Admitting: Family Medicine

## 2014-10-05 VITALS — BP 130/82 | HR 62 | Ht 65.0 in | Wt 181.0 lb

## 2014-10-05 DIAGNOSIS — E669 Obesity, unspecified: Secondary | ICD-10-CM | POA: Insufficient documentation

## 2014-10-05 DIAGNOSIS — I10 Essential (primary) hypertension: Secondary | ICD-10-CM

## 2014-10-05 NOTE — Progress Notes (Signed)
   Subjective:    Patient ID: Amy Wilson, female    DOB: 10/03/1951, 63 y.o.   MRN: [id number]  Hypertension   Hypertension- Pt denies chest pain, SOB, dizziness, or heart palpitations.  Taking meds as directed w/o problems.  Denies medication side effects.  We adjust her medication at the last OV.  No S. E. she's not had any more syncopal episodes and she was last year. She still feels a little weak but feels like she overall is getting her strength back  Review of Systems     Objective:   Physical Exam  Constitutional: She is oriented to person, place, and time. She appears well-developed and well-nourished.  HENT:  Head: Normocephalic and atraumatic.  Cardiovascular: Normal rate, regular rhythm and normal heart sounds.   Pulmonary/Chest: Effort normal and breath sounds normal.  Neurological: She is alert and oriented to person, place, and time.  Skin: Skin is warm and dry.  Psychiatric: She has a normal mood and affect. Her behavior is normal.          Assessment & Plan:  HTN- repeat blood pressure looks fantastic. Well controlled. He thinks he actually has a common of whitecoat hypertension. Most commonly recheck her blood pressure looks much better. She's tolerating the adjust the regimen well. Followup in 6 months. She is due for CMP and fasting lipid panel. Lab slip given to her today.  Encouraged her to think about getting a mammogram. She wasn't quite ready to commit to doing it but encouraged her to think about it

## 2015-02-14 DIAGNOSIS — H5203 Hypermetropia, bilateral: Secondary | ICD-10-CM | POA: Diagnosis not present

## 2015-02-14 DIAGNOSIS — I10 Essential (primary) hypertension: Secondary | ICD-10-CM | POA: Diagnosis not present

## 2015-02-14 DIAGNOSIS — H5210 Myopia, unspecified eye: Secondary | ICD-10-CM | POA: Diagnosis not present

## 2015-03-05 ENCOUNTER — Encounter: Payer: Self-pay | Admitting: Family Medicine

## 2015-04-08 ENCOUNTER — Other Ambulatory Visit: Payer: Self-pay | Admitting: Family Medicine

## 2015-06-14 ENCOUNTER — Encounter: Payer: Self-pay | Admitting: Family Medicine

## 2015-06-14 ENCOUNTER — Ambulatory Visit (INDEPENDENT_AMBULATORY_CARE_PROVIDER_SITE_OTHER): Payer: Commercial Managed Care - HMO | Admitting: Family Medicine

## 2015-06-14 ENCOUNTER — Telehealth: Payer: Self-pay | Admitting: Family Medicine

## 2015-06-14 VITALS — BP 160/92 | HR 80 | Ht 65.0 in | Wt 181.0 lb

## 2015-06-14 DIAGNOSIS — Z1231 Encounter for screening mammogram for malignant neoplasm of breast: Secondary | ICD-10-CM

## 2015-06-14 DIAGNOSIS — I10 Essential (primary) hypertension: Secondary | ICD-10-CM | POA: Diagnosis not present

## 2015-06-14 MED ORDER — LISINOPRIL 40 MG PO TABS: 40.0000 mg | ORAL_TABLET | Freq: Every day | ORAL | Status: DC

## 2015-06-14 MED ORDER — BISOPROLOL-HYDROCHLOROTHIAZIDE 5-6.25 MG PO TABS: 1.0000 | ORAL_TABLET | Freq: Every day | ORAL | Status: DC

## 2015-06-14 NOTE — Progress Notes (Signed)
   Subjective:    Patient ID: Amy Wilson, female    DOB: 09-29-1951, 64 y.o.   MRN: [id number]  HPI Hypertension- Pt denies chest pain, SOB, dizziness, or heart palpitations.  Taking meds as directed w/o problems.  Denies medication side effects.   No change in diet. Weight is the same. Not checking BP at home. Going to gym 3 days per week. WAlking daily. Swims for exercise.    Review of Systems  BP 160/92 mmHg  Pulse 80  Ht 5\' 5"  (1.651 m)  Wt 181 lb (82.101 kg)  BMI 30.12 kg/m2    No Known Allergies  No past medical history on file.  Past Surgical History  Procedure Laterality Date  . Knee arthroscopy Right     History   Social History  . Marital Status: Divorced    Spouse Name: N/A  . Number of Children: N/A  . Years of Education: N/A   Occupational History  . Not on file.   Social History Main Topics  . Smoking status: Never Smoker   . Smokeless tobacco: Not on file  . Alcohol Use: Not on file  . Drug Use: No  . Sexual Activity: No   Other Topics Concern  . Not on file   Social History Narrative    No family history on file.  Outpatient Encounter Prescriptions as of 06/14/2015  Medication Sig  . bisoprolol-hydrochlorothiazide (ZIAC) 5-6.25 MG per tablet Take 1 tablet by mouth daily.  Marland Kitchen lisinopril (PRINIVIL,ZESTRIL) 20 MG tablet Take two tablets by mouth daily  . [DISCONTINUED] bisoprolol-hydrochlorothiazide (ZIAC) 5-6.25 MG per tablet TAKE ONE TABLET BY MOUTH ONE TIME DAILY   No facility-administered encounter medications on file as of 06/14/2015.          Objective:   Physical Exam  Constitutional: She is oriented to person, place, and time. She appears well-developed and well-nourished.  HENT:  Head: Normocephalic and atraumatic.  Cardiovascular: Normal rate, regular rhythm and normal heart sounds.   Pulmonary/Chest: Effort normal and breath sounds normal.  Neurological: She is alert and oriented to person, place, and time.  Skin: Skin  is warm and dry.  Psychiatric: She has a normal mood and affect. Her behavior is normal.          Assessment & Plan:  HTN - uncontrolled. Will increase lisinopril to 40 mg daily. We also have room to go up on her bisoprolol if needed. I like to see her back in about 6-8 weeks to make sure blood pressures getting under control.  Due for screening mammogram. Ordered.   Discussed need for pneumonia vaccine. Handout given. Encouraged her to check with her insurance as it should be covered 100% but I want her to make sure beforehand as she is worried about financial concerns.

## 2015-06-14 NOTE — Telephone Encounter (Signed)
Pt informed.Amy Wilson Lynetta  

## 2015-06-14 NOTE — Telephone Encounter (Signed)
Please call patient and let her know that on increasing the dose on her lisinopril to 40 mg. I would like to see her back in about 6-8 weeks to recheck her blood pressure.

## 2015-06-15 LAB — COMPLETE METABOLIC PANEL WITH GFR
ALK PHOS: 75 U/L (ref 39–117)
ALT: 19 U/L (ref 0–35)
AST: 21 U/L (ref 0–37)
Albumin: 4.4 g/dL (ref 3.5–5.2)
BUN: 16 mg/dL (ref 6–23)
CALCIUM: 9.6 mg/dL (ref 8.4–10.5)
CO2: 26 mEq/L (ref 19–32)
Chloride: 100 mEq/L (ref 96–112)
Creat: 0.88 mg/dL (ref 0.50–1.10)
GFR, Est African American: 79 mL/min
GFR, Est Non African American: 69 mL/min
GLUCOSE: 102 mg/dL — AB (ref 70–99)
POTASSIUM: 4.6 meq/L (ref 3.5–5.3)
Sodium: 143 mEq/L (ref 135–145)
Total Bilirubin: 0.6 mg/dL (ref 0.2–1.2)
Total Protein: 8.1 g/dL (ref 6.0–8.3)

## 2015-08-12 ENCOUNTER — Ambulatory Visit (INDEPENDENT_AMBULATORY_CARE_PROVIDER_SITE_OTHER): Payer: Commercial Managed Care - HMO | Admitting: Family Medicine

## 2015-08-12 ENCOUNTER — Encounter: Payer: Self-pay | Admitting: Family Medicine

## 2015-08-12 VITALS — BP 168/98 | HR 96 | Temp 98.8°F | Ht 65.0 in | Wt 160.0 lb

## 2015-08-12 DIAGNOSIS — K029 Dental caries, unspecified: Secondary | ICD-10-CM | POA: Diagnosis not present

## 2015-08-12 DIAGNOSIS — K1370 Unspecified lesions of oral mucosa: Secondary | ICD-10-CM | POA: Diagnosis not present

## 2015-08-12 DIAGNOSIS — J029 Acute pharyngitis, unspecified: Secondary | ICD-10-CM | POA: Diagnosis not present

## 2015-08-12 MED ORDER — FLUCONAZOLE 150 MG PO TABS: 150.0000 mg | ORAL_TABLET | ORAL | Status: DC

## 2015-08-12 NOTE — Progress Notes (Signed)
Subjective:    Patient ID: Amy Wilson, female    DOB: 1951/07/23, 64 y.o.   MRN: [id number]  HPI Mouth Lesions canker sore on R side of tongue x 1 mo. Says started with several and now down to one large once.   pt reports that she woke up one morning and her tongue had a big white spot on the side of it. she thinks that it is a canker sore she took some Amoxicillin for 7 days 2 tabs a day, garggled with warm salt water and peroxide. denies any bleeding of her gums she has not been to the dentist and stated that she does have a broken tooth on that side and cannot afford to go to the dentist bc she doesn't have insurance. Her throat has been hurting for a month on right side. Marland Kitchen  Has been painful to swallow. No dysphagia.    Review of Systems  BP 168/98 mmHg  Pulse 96  Temp(Src) 98.8 F (37.1 C)  Ht 5\' 5"  (1.651 m)  Wt 160 lb (72.576 kg)  BMI 26.63 kg/m2    No Known Allergies  No past medical history on file.  Past Surgical History  Procedure Laterality Date  . Knee arthroscopy Right     Social History   Social History  . Marital Status: Divorced    Spouse Name: N/A  . Number of Children: N/A  . Years of Education: N/A   Occupational History  . Not on file.   Social History Main Topics  . Smoking status: Never Smoker   . Smokeless tobacco: Not on file  . Alcohol Use: Not on file  . Drug Use: No  . Sexual Activity: No   Other Topics Concern  . Not on file   Social History Narrative    No family history on file.  Outpatient Encounter Prescriptions as of 08/12/2015  Medication Sig  . bisoprolol-hydrochlorothiazide (ZIAC) 5-6.25 MG per tablet Take 1 tablet by mouth daily.  . fluconazole (DIFLUCAN) 150 MG tablet Take 1 tablet (150 mg total) by mouth every other day.  . lisinopril (PRINIVIL,ZESTRIL) 40 MG tablet Take 1 tablet (40 mg total) by mouth daily.   No facility-administered encounter medications on file as of 08/12/2015.          Objective:   Physical Exam  Constitutional: She is oriented to person, place, and time. She appears well-developed and well-nourished.  HENT:  Head: Normocephalic and atraumatic.  Right Ear: External ear normal.  Left Ear: External ear normal.  Nose: Nose normal.  Mouth/Throat: Oropharynx is clear and moist. No oropharyngeal exudate.  She has a large white ulcerated area on the right side of her tongue.   Eyes: Conjunctivae and EOM are normal. Pupils are equal, round, and reactive to light.  Neck: Neck supple. No thyromegaly present.  Cardiovascular: Normal rate, regular rhythm and normal heart sounds.   Pulmonary/Chest: Effort normal and breath sounds normal. She has no wheezes.  Lymphadenopathy:    She has no cervical adenopathy.  Neurological: She is alert and oriented to person, place, and time.  Skin: Skin is warm and dry.  Psychiatric: She has a normal mood and affect. Her behavior is normal.          Assessment & Plan:  Mouth lesion - possible canker sore versus growth.  continue with peroxide and salt water gargles since that seems to be the only thing that has really helped. If not completely resolved by Monday of next  week then refer to ENT for further evaluation and possible biopsy. Consider could be some type of cancerous mouth lesion.  ST - Consider thrush though not on inhalers, etc. will tx with diflucan 150mg . EOD x 3 tab. Call if not better on Monday.   Dental decay - needs to see her dentist to have her tooth extracted.

## 2015-11-27 DIAGNOSIS — Z0100 Encounter for examination of eyes and vision without abnormal findings: Secondary | ICD-10-CM | POA: Diagnosis not present

## 2015-11-27 DIAGNOSIS — H5203 Hypermetropia, bilateral: Secondary | ICD-10-CM | POA: Diagnosis not present

## 2015-11-27 DIAGNOSIS — H52209 Unspecified astigmatism, unspecified eye: Secondary | ICD-10-CM | POA: Diagnosis not present

## 2015-11-27 DIAGNOSIS — H524 Presbyopia: Secondary | ICD-10-CM | POA: Diagnosis not present

## 2016-03-14 ENCOUNTER — Other Ambulatory Visit: Payer: Self-pay | Admitting: Family Medicine

## 2016-03-23 ENCOUNTER — Telehealth: Payer: Self-pay | Admitting: Family Medicine

## 2016-03-23 NOTE — Telephone Encounter (Signed)
I called pt and left a message to let her know that she is due for a f/u on her BP meds and to call our office to schedule

## 2016-04-25 ENCOUNTER — Other Ambulatory Visit: Payer: Self-pay | Admitting: Family Medicine

## 2016-05-15 ENCOUNTER — Other Ambulatory Visit: Payer: Self-pay | Admitting: Family Medicine

## 2016-05-27 ENCOUNTER — Telehealth: Payer: Self-pay | Admitting: Family Medicine

## 2016-05-27 NOTE — Telephone Encounter (Signed)
I left pt a message to let her know that she is due for a f/u on her BP

## 2016-05-28 MED ORDER — BISOPROLOL-HYDROCHLOROTHIAZIDE 5-6.25 MG PO TABS: 1.0000 | ORAL_TABLET | Freq: Every day | ORAL | Status: DC

## 2016-05-28 NOTE — Addendum Note (Signed)
Addended by: Huel Cote on: 05/28/2016 09:30 AM   Modules accepted: Orders

## 2016-06-01 ENCOUNTER — Other Ambulatory Visit: Payer: Self-pay

## 2016-06-01 ENCOUNTER — Telehealth: Payer: Self-pay

## 2016-06-01 MED ORDER — LISINOPRIL 40 MG PO TABS: 40.0000 mg | ORAL_TABLET | Freq: Every day | ORAL | Status: DC

## 2016-06-01 NOTE — Telephone Encounter (Signed)
OK to fill for 30 days?

## 2016-06-19 ENCOUNTER — Ambulatory Visit (INDEPENDENT_AMBULATORY_CARE_PROVIDER_SITE_OTHER): Payer: Commercial Managed Care - HMO | Admitting: Family Medicine

## 2016-06-19 ENCOUNTER — Encounter: Payer: Self-pay | Admitting: Family Medicine

## 2016-06-19 VITALS — BP 143/89 | HR 64 | Wt 174.0 lb

## 2016-06-19 DIAGNOSIS — I10 Essential (primary) hypertension: Secondary | ICD-10-CM | POA: Diagnosis not present

## 2016-06-19 DIAGNOSIS — E785 Hyperlipidemia, unspecified: Secondary | ICD-10-CM | POA: Diagnosis not present

## 2016-06-19 MED ORDER — BISOPROLOL-HYDROCHLOROTHIAZIDE 10-6.25 MG PO TABS: 1.0000 | ORAL_TABLET | Freq: Every day | ORAL | Status: DC

## 2016-06-19 MED ORDER — LISINOPRIL 40 MG PO TABS: 40.0000 mg | ORAL_TABLET | Freq: Every day | ORAL | Status: DC

## 2016-06-19 NOTE — Progress Notes (Signed)
Subjective:    CC: HTN   HPI:  Hypertension- Pt denies chest pain, SOB, dizziness, or heart palpitations.  Taking meds as directed w/o problems.  Denies medication side effects.    Hyperlipidemia-not currently on medication but she is due to recheck her levels.  Past medical history, Surgical history, Family history not pertinant except as noted below, Social history, Allergies, and medications have been entered into the medical record, reviewed, and corrections made.   Review of Systems: No fevers, chills, night sweats, weight loss, chest pain, or shortness of breath.   Objective:    General: Well Developed, well nourished, and in no acute distress.  Neuro: Alert and oriented x3, extra-ocular muscles intact, sensation grossly intact.  HEENT: Normocephalic, atraumatic  Skin: Warm and dry, no rashes. Cardiac: Regular rate and rhythm, no murmurs rubs or gallops, no lower extremity edema.  Respiratory: Clear to auscultation bilaterally. Not using accessory muscles, speaking in full sentences.   Impression and Recommendations:   HTN -  Uncontrolled.  Increase bisoprolol. New perception sent to the pharmacy.  F/U in 6 months.    Hyperlipidemia- due to recheck lipids.    She doesn't believe in vaccines  Discussed need for mammogram. She said she will think about it. She is to be out of town for about a month but then will be back after that. Encouraged her to call me if she is ready to do so at that time. Next  Also discussed need for screening colon cancer- discussed Cologuard since she is not interested in colonoscopy.

## 2016-12-02 ENCOUNTER — Other Ambulatory Visit: Payer: Self-pay | Admitting: Family Medicine

## 2017-01-28 ENCOUNTER — Other Ambulatory Visit: Payer: Self-pay | Admitting: Family Medicine

## 2017-01-28 DIAGNOSIS — I10 Essential (primary) hypertension: Secondary | ICD-10-CM

## 2017-02-19 ENCOUNTER — Other Ambulatory Visit (HOSPITAL_COMMUNITY)
Admission: RE | Admit: 2017-02-19 | Discharge: 2017-02-19 | Disposition: A | Discharge status: Home / Self Care | Payer: Medicare HMO | Source: Ambulatory Visit | Attending: Family Medicine | Admitting: Family Medicine

## 2017-02-19 ENCOUNTER — Ambulatory Visit: Payer: Commercial Managed Care - HMO | Admitting: Family Medicine

## 2017-02-19 ENCOUNTER — Ambulatory Visit (INDEPENDENT_AMBULATORY_CARE_PROVIDER_SITE_OTHER): Payer: Medicare HMO | Admitting: Family Medicine

## 2017-02-19 ENCOUNTER — Encounter: Payer: Self-pay | Admitting: Family Medicine

## 2017-02-19 ENCOUNTER — Ambulatory Visit (HOSPITAL_BASED_OUTPATIENT_CLINIC_OR_DEPARTMENT_OTHER)
Admission: RE | Admit: 2017-02-19 | Discharge: 2017-02-19 | Disposition: A | Discharge status: Home / Self Care | Payer: Medicare HMO | Source: Ambulatory Visit | Attending: Family Medicine | Admitting: Family Medicine

## 2017-02-19 VITALS — BP 128/82 | HR 85 | Temp 100.5°F | Ht 65.0 in | Wt 179.0 lb

## 2017-02-19 DIAGNOSIS — K8020 Calculus of gallbladder without cholecystitis without obstruction: Secondary | ICD-10-CM | POA: Insufficient documentation

## 2017-02-19 DIAGNOSIS — Z124 Encounter for screening for malignant neoplasm of cervix: Secondary | ICD-10-CM | POA: Diagnosis not present

## 2017-02-19 DIAGNOSIS — N3090 Cystitis, unspecified without hematuria: Secondary | ICD-10-CM | POA: Diagnosis not present

## 2017-02-19 DIAGNOSIS — N12 Tubulo-interstitial nephritis, not specified as acute or chronic: Secondary | ICD-10-CM | POA: Insufficient documentation

## 2017-02-19 DIAGNOSIS — I700 Atherosclerosis of aorta: Secondary | ICD-10-CM | POA: Diagnosis not present

## 2017-02-19 DIAGNOSIS — K760 Fatty (change of) liver, not elsewhere classified: Secondary | ICD-10-CM | POA: Diagnosis not present

## 2017-02-19 DIAGNOSIS — D259 Leiomyoma of uterus, unspecified: Secondary | ICD-10-CM | POA: Diagnosis not present

## 2017-02-19 DIAGNOSIS — R102 Pelvic and perineal pain: Secondary | ICD-10-CM | POA: Diagnosis not present

## 2017-02-19 DIAGNOSIS — I10 Essential (primary) hypertension: Secondary | ICD-10-CM

## 2017-02-19 LAB — COMPLETE METABOLIC PANEL WITH GFR
ALT: 14 U/L (ref 6–29)
AST: 18 U/L (ref 10–35)
Albumin: 4.2 g/dL (ref 3.6–5.1)
Alkaline Phosphatase: 81 U/L (ref 33–130)
BUN: 17 mg/dL (ref 7–25)
CHLORIDE: 102 mmol/L (ref 98–110)
CO2: 28 mmol/L (ref 20–31)
CREATININE: 1 mg/dL — AB (ref 0.50–0.99)
Calcium: 9.4 mg/dL (ref 8.6–10.4)
GFR, Est African American: 67 mL/min (ref >=60)
GFR, Est Non African American: 58 mL/min — ABNORMAL LOW (ref >=60)
GLUCOSE: 102 mg/dL — AB (ref 65–99)
Potassium: 4.4 mmol/L (ref 3.5–5.3)
SODIUM: 139 mmol/L (ref 135–146)
Total Bilirubin: 0.9 mg/dL (ref 0.2–1.2)
Total Protein: 7.4 g/dL (ref 6.1–8.1)

## 2017-02-19 LAB — POCT URINALYSIS DIPSTICK
BILIRUBIN UA: NEGATIVE
GLUCOSE UA: NEGATIVE
KETONES UA: NEGATIVE
Nitrite, UA: NEGATIVE
Protein, UA: >=300
Spec Grav, UA: 1.02
UROBILINOGEN UA: 0.2
pH, UA: 6.5

## 2017-02-19 MED ORDER — IOPAMIDOL (ISOVUE-300) INJECTION 61%
100.0000 mL | Freq: Once | INTRAVENOUS | Status: AC | PRN
  Administered 2017-02-19: 100 mL via INTRAVENOUS

## 2017-02-19 MED ORDER — CIPROFLOXACIN HCL 500 MG PO TABS: 500.0000 mg | ORAL_TABLET | Freq: Two times a day (BID) | ORAL | 0 refills | Status: DC

## 2017-02-19 NOTE — Progress Notes (Signed)
   Subjective:    Patient ID: Amy Wilson, female    DOB: 1951/11/29, 66 y.o.   MRN: [id number]  HPI  patient comes in today complaining of 3 days of pelvic cramping at the end of urination. She has not noticed any blood or odor to the urine and no burning with urination. She feels like she's been having some chills. She's not currently sexually active and has not been recently.No worsening or alleviating factors.  Review of Systems She's not had any back pain.  BP 128/82   Pulse 85   Temp (!) 100.5 F (38.1 C)   Ht 5\' 5"  (1.651 m)   Wt 179 lb (81.2 kg)   SpO2 99%   BMI 29.79 kg/m     No Known Allergies  No past medical history on file.  Past Surgical History:  Procedure Laterality Date  . KNEE ARTHROSCOPY Right     Social History   Social History  . Marital status: Divorced    Spouse name: N/A  . Number of children: N/A  . Years of education: N/A   Occupational History  . Not on file.   Social History Main Topics  . Smoking status: Never Smoker  . Smokeless tobacco: Never Used  . Alcohol use Not on file  . Drug use: No  . Sexual activity: No   Other Topics Concern  . Not on file   Social History Narrative  . No narrative on file    No family history on file.  Outpatient Encounter Prescriptions as of 02/19/2017  Medication Sig  . bisoprolol-hydrochlorothiazide (ZIAC) 10-6.25 MG tablet Take 1 tablet by mouth daily. NEED FOLLOW UP VISIT FOR MORE REFILLS  . ciprofloxacin (CIPRO) 500 MG tablet Take 1 tablet (500 mg total) by mouth 2 (two) times daily.  Marland Kitchen lisinopril (PRINIVIL,ZESTRIL) 40 MG tablet Take 1 tablet (40 mg total) by mouth daily. NEED FOLLOW UP VISIT FOR MORE REFILLS   No facility-administered encounter medications on file as of 02/19/2017.          Objective:   Physical Exam  Constitutional: She is oriented to person, place, and time. She appears well-developed and well-nourished.  HENT:  Head: Normocephalic and atraumatic.   Cardiovascular: Normal rate, regular rhythm and normal heart sounds.   Pulmonary/Chest: Effort normal and breath sounds normal.  Abdominal: Soft. Bowel sounds are normal. She exhibits no distension. There is tenderness.  Extremely tender in the right and left lower quadrants as well as the suprapubic area. Also very tender in the left upper quadrant.  Genitourinary: Vagina normal and uterus normal. There is no rash on the right labia. There is no rash on the left labia. No erythema, tenderness or bleeding in the vagina. No foreign body in the vagina. No signs of injury around the vagina. No vaginal discharge found.  Neurological: She is alert and oriented to person, place, and time.  Skin: Skin is warm and dry.  Psychiatric: She has a normal mood and affect. Her behavior is normal.        Assessment & Plan:  Female pelvic pain-She is extremely tender on exam today and has a low-grade fever here in the clinic. which is very concerning. We'll schedule for CT abdomen and pelvis acutely. Will call with results once available. Because her tenderness extends beyond the pelvis diverticulitis could be possible as well. UA pos.  Will start on Cipro.   Hypertension-repeat blood pressure looks great.

## 2017-02-20 LAB — URINALYSIS, MICROSCOPIC ONLY
Bacteria, UA: NONE SEEN [HPF]
CASTS: NONE SEEN [LPF]
CRYSTALS: NONE SEEN [HPF]
WBC, UA: >60 WBC/HPF — AB (ref <=5)
YEAST: NONE SEEN [HPF]

## 2017-02-21 ENCOUNTER — Other Ambulatory Visit: Payer: Self-pay | Admitting: Family Medicine

## 2017-02-21 LAB — URINE CULTURE

## 2017-02-21 MED ORDER — CIPROFLOXACIN HCL 500 MG PO TABS: 500.0000 mg | ORAL_TABLET | Freq: Two times a day (BID) | ORAL | 0 refills | Status: DC

## 2017-02-22 ENCOUNTER — Other Ambulatory Visit: Payer: Self-pay

## 2017-02-22 ENCOUNTER — Ambulatory Visit (INDEPENDENT_AMBULATORY_CARE_PROVIDER_SITE_OTHER): Payer: Medicare HMO | Admitting: Family Medicine

## 2017-02-22 VITALS — BP 145/94 | HR 73 | Temp 98.2°F | Ht 65.0 in | Wt 178.0 lb

## 2017-02-22 DIAGNOSIS — N12 Tubulo-interstitial nephritis, not specified as acute or chronic: Secondary | ICD-10-CM

## 2017-02-22 DIAGNOSIS — R300 Dysuria: Secondary | ICD-10-CM | POA: Diagnosis not present

## 2017-02-22 DIAGNOSIS — R9389 Abnormal findings on diagnostic imaging of other specified body structures: Secondary | ICD-10-CM

## 2017-02-22 MED ORDER — CEFTRIAXONE SODIUM 1 G IJ SOLR
1.0000 g | Freq: Once | INTRAMUSCULAR | Status: AC
  Administered 2017-02-22: 1 g via INTRAMUSCULAR

## 2017-02-22 MED ORDER — CIPROFLOXACIN HCL 500 MG PO TABS
500.0000 mg | ORAL_TABLET | Freq: Two times a day (BID) | ORAL | 0 refills | Status: AC
Start: 2017-02-22 — End: 2017-02-25

## 2017-02-22 NOTE — Progress Notes (Signed)
Agree with above. F/U tomorrow for next Rocephin injection.  [name], MD'

## 2017-02-22 NOTE — Progress Notes (Signed)
   Subjective:    Patient ID: Amy Wilson, female    DOB: 06-20-1951, 66 y.o.   MRN: [id number]  HPI Pt is here for pelvic pain for 6 days. Pt reports no recent antibiotics use or recent catheterization. Pt has not taken Azo. Pt has chills, and pelvic pain. Pt was given Ceftriaxone injection today. Pt tolerated injection well in RUOQ and without any complications.    Review of Systems     Objective:   Physical Exam        Assessment & Plan:   Pt tolerated injection well in RUOQ and without any complications. Pt advised to come in for Ceftriaxone injection on 02/23/17 and 02/24/17 per Dr. Madilyn Fireman. Pt advised of CT results and pt was given the breast Center phone # to schedule an apt for diagnostic mammogram/Ultrasound. Orders placed.

## 2017-02-23 ENCOUNTER — Ambulatory Visit: Payer: Commercial Managed Care - HMO | Admitting: Family Medicine

## 2017-02-23 ENCOUNTER — Ambulatory Visit (INDEPENDENT_AMBULATORY_CARE_PROVIDER_SITE_OTHER): Payer: Medicare HMO | Admitting: Family Medicine

## 2017-02-23 VITALS — BP 127/71 | HR 68 | Temp 97.6°F | Ht 65.0 in | Wt 175.0 lb

## 2017-02-23 DIAGNOSIS — N12 Tubulo-interstitial nephritis, not specified as acute or chronic: Secondary | ICD-10-CM

## 2017-02-23 DIAGNOSIS — R42 Dizziness and giddiness: Secondary | ICD-10-CM

## 2017-02-23 DIAGNOSIS — R102 Pelvic and perineal pain: Secondary | ICD-10-CM

## 2017-02-23 MED ORDER — CEFTRIAXONE SODIUM 1 G IJ SOLR
1.0000 g | Freq: Once | INTRAMUSCULAR | Status: AC
  Administered 2017-02-23: 1 g via INTRAMUSCULAR

## 2017-02-23 NOTE — Progress Notes (Signed)
Subjective:    CC: Dizziness.   HPI:  66 year old female diagnosed with pyelonephritis comes in today for her second Rocephin injection. She said she woke up just not feeling well in general this morning. She said she had a banana and drink a couple water. Her daughter brought her here for her office visit today and while sitting in the rain started to feel little lightheaded and dizzy. She says she's been trying to drink a little bit more water than usual but says she always struggles with staying well-hydrated. She denies any nausea or vomiting. She did start the Cipro until yesterday morning. She's had 3 doses total.. She has not had a fever since last Friday night and has not had any dysuria.    Past medical history, Surgical history, Family history not pertinant except as noted below, Social history, Allergies, and medications have been entered into the medical record, reviewed, and corrections made.   Review of Systems: No fevers, chills, night sweats, weight loss, chest pain, or shortness of breath.   Objective:    General: Well Developed, well nourished, and in no acute distress.  Neuro: Alert and oriented x3, extra-ocular muscles intact, sensation grossly intact.  HEENT: Normocephalic, atraumatic  Skin: Warm and dry, no rashes. Cardiac: Regular rate and rhythm, no murmurs rubs or gallops, no lower extremity edema.  Respiratory: Clear to auscultation bilaterally. Not using accessory muscles, speaking in full sentences.   Assessment and plan:  Dizziness/lightheadedness-we did orthostatics on her today and they were negative.heart and lungs sound good. We had her sit and rest for a while and give her some crackers and more water to eat. Encouraged her to really push hydration today is her mouth does appear to be a little dry. She's not hypotensive. If not improving as the day progresses and recommend she go to the emergency department for further evaluation.   Pyelonephritis-given  second Rocephin injection and continue with oral ciprofloxacin. Follow-up tomorrow for third injection. Call if any problems.

## 2017-02-24 ENCOUNTER — Ambulatory Visit (INDEPENDENT_AMBULATORY_CARE_PROVIDER_SITE_OTHER): Payer: Medicare HMO | Admitting: Family Medicine

## 2017-02-24 VITALS — BP 121/66 | HR 68 | Temp 98.2°F | Ht 65.0 in | Wt 176.0 lb

## 2017-02-24 DIAGNOSIS — N12 Tubulo-interstitial nephritis, not specified as acute or chronic: Secondary | ICD-10-CM | POA: Diagnosis not present

## 2017-02-24 DIAGNOSIS — R102 Pelvic and perineal pain: Secondary | ICD-10-CM

## 2017-02-24 LAB — BASIC METABOLIC PANEL WITH GFR
BUN: 15 mg/dL (ref 7–25)
CALCIUM: 9.1 mg/dL (ref 8.6–10.4)
CO2: 26 mmol/L (ref 20–31)
CREATININE: 0.92 mg/dL (ref 0.50–0.99)
Chloride: 101 mmol/L (ref 98–110)
GFR, EST AFRICAN AMERICAN: 75 mL/min (ref >=60)
GFR, EST NON AFRICAN AMERICAN: 65 mL/min (ref >=60)
GLUCOSE: 70 mg/dL (ref 65–99)
Potassium: 3.7 mmol/L (ref 3.5–5.3)
Sodium: 142 mmol/L (ref 135–146)

## 2017-02-24 LAB — CBC WITH DIFFERENTIAL/PLATELET
BASOS ABS: 0 {cells}/uL (ref 0–200)
Basophils Relative: 0 %
EOS PCT: 1 %
Eosinophils Absolute: 52 cells/uL (ref 15–500)
HCT: 45.1 % — ABNORMAL HIGH (ref 35.0–45.0)
HEMOGLOBIN: 15.1 g/dL (ref 11.7–15.5)
LYMPHS ABS: 1872 {cells}/uL (ref 850–3900)
LYMPHS PCT: 36 %
MCH: 30.5 pg (ref 27.0–33.0)
MCHC: 33.5 g/dL (ref 32.0–36.0)
MCV: 91.1 fL (ref 80.0–100.0)
MPV: 10.3 fL (ref 7.5–12.5)
Monocytes Absolute: 676 cells/uL (ref 200–950)
Monocytes Relative: 13 %
NEUTROS PCT: 50 %
Neutro Abs: 2600 cells/uL (ref 1500–7800)
Platelets: 200 10*3/uL (ref 140–400)
RBC: 4.95 MIL/uL (ref 3.80–5.10)
RDW: 14.7 % (ref 11.0–15.0)
WBC: 5.2 10*3/uL (ref 3.8–10.8)

## 2017-02-24 MED ORDER — CEFTRIAXONE SODIUM 1 G IJ SOLR
1.0000 g | Freq: Once | INTRAMUSCULAR | Status: AC
  Administered 2017-02-24: 1 g via INTRAMUSCULAR

## 2017-02-24 NOTE — Progress Notes (Signed)
   Subjective:    Patient ID: Amy Wilson, female    DOB: 1951/05/29, 66 y.o.   MRN: [id number]  HPI Pt is here for pelvic pain 8 days. Pt reports taking antibiotics. Pt has not had a recent catheterization. Pt has not taken Azo. Pt was given Ceftriaxone injection today.   Review of Systems     Objective:   Physical Exam        Assessment & Plan:   Pt tolerated injection well in RUOQ and without any complications. Pt advised to come in for Ceftriaxone injection on 02/23/17 and 02/24/17 per Dr. Madilyn Fireman.

## 2017-02-24 NOTE — Progress Notes (Signed)
Reviewed and agree with above.  Catherine Metheney, MD  

## 2017-02-25 ENCOUNTER — Ambulatory Visit: Payer: Medicare HMO

## 2017-02-26 LAB — CYTOLOGY - PAP: DIAGNOSIS: NEGATIVE

## 2017-03-01 ENCOUNTER — Other Ambulatory Visit: Payer: Medicare HMO

## 2017-03-01 ENCOUNTER — Inpatient Hospital Stay: Admission: RE | Admit: 2017-03-01 | Payer: Medicare HMO | Source: Ambulatory Visit

## 2017-03-03 ENCOUNTER — Ambulatory Visit
Admission: RE | Admit: 2017-03-03 | Discharge: 2017-03-03 | Disposition: A | Discharge status: Home / Self Care | Payer: Medicare HMO | Source: Ambulatory Visit | Attending: Family Medicine | Admitting: Family Medicine

## 2017-03-03 ENCOUNTER — Other Ambulatory Visit: Payer: Self-pay | Admitting: Family Medicine

## 2017-03-03 DIAGNOSIS — N6320 Unspecified lump in the left breast, unspecified quadrant: Secondary | ICD-10-CM

## 2017-03-03 DIAGNOSIS — R922 Inconclusive mammogram: Secondary | ICD-10-CM | POA: Diagnosis not present

## 2017-03-03 DIAGNOSIS — N6489 Other specified disorders of breast: Secondary | ICD-10-CM | POA: Diagnosis not present

## 2017-03-03 DIAGNOSIS — R9389 Abnormal findings on diagnostic imaging of other specified body structures: Secondary | ICD-10-CM

## 2017-04-07 ENCOUNTER — Other Ambulatory Visit: Payer: Self-pay | Admitting: Family Medicine

## 2017-04-07 DIAGNOSIS — I10 Essential (primary) hypertension: Secondary | ICD-10-CM

## 2017-04-09 ENCOUNTER — Other Ambulatory Visit: Payer: Self-pay | Admitting: Family Medicine

## 2017-04-23 ENCOUNTER — Other Ambulatory Visit: Payer: Self-pay | Admitting: Family Medicine

## 2017-07-05 ENCOUNTER — Other Ambulatory Visit: Payer: Self-pay | Admitting: Family Medicine

## 2017-07-05 DIAGNOSIS — I10 Essential (primary) hypertension: Secondary | ICD-10-CM

## 2017-09-07 ENCOUNTER — Other Ambulatory Visit: Payer: Self-pay

## 2017-09-07 MED ORDER — BISOPROLOL-HYDROCHLOROTHIAZIDE 10-6.25 MG PO TABS: 1.0000 | ORAL_TABLET | Freq: Every day | ORAL | 1 refills | Status: DC

## 2017-11-22 ENCOUNTER — Ambulatory Visit: Payer: Medicare HMO

## 2017-12-20 ENCOUNTER — Other Ambulatory Visit: Payer: Self-pay | Admitting: *Deleted

## 2017-12-20 DIAGNOSIS — I10 Essential (primary) hypertension: Secondary | ICD-10-CM

## 2017-12-20 MED ORDER — LISINOPRIL 40 MG PO TABS: 40.0000 mg | ORAL_TABLET | Freq: Every day | ORAL | 0 refills | Status: DC

## 2018-01-03 DIAGNOSIS — H5203 Hypermetropia, bilateral: Secondary | ICD-10-CM | POA: Diagnosis not present

## 2018-01-04 DIAGNOSIS — H5203 Hypermetropia, bilateral: Secondary | ICD-10-CM | POA: Diagnosis not present

## 2018-01-04 DIAGNOSIS — H52209 Unspecified astigmatism, unspecified eye: Secondary | ICD-10-CM | POA: Diagnosis not present

## 2018-01-04 DIAGNOSIS — H524 Presbyopia: Secondary | ICD-10-CM | POA: Diagnosis not present

## 2018-03-26 ENCOUNTER — Other Ambulatory Visit: Payer: Self-pay | Admitting: Family Medicine

## 2018-03-28 ENCOUNTER — Other Ambulatory Visit: Payer: Self-pay | Admitting: Family Medicine

## 2018-04-22 ENCOUNTER — Other Ambulatory Visit: Payer: Self-pay | Admitting: Family Medicine

## 2018-04-26 ENCOUNTER — Other Ambulatory Visit: Payer: Self-pay | Admitting: Family Medicine

## 2018-04-26 DIAGNOSIS — I10 Essential (primary) hypertension: Secondary | ICD-10-CM

## 2018-05-13 ENCOUNTER — Ambulatory Visit (INDEPENDENT_AMBULATORY_CARE_PROVIDER_SITE_OTHER): Payer: Medicare HMO | Admitting: Family Medicine

## 2018-05-13 ENCOUNTER — Encounter: Payer: Self-pay | Admitting: Family Medicine

## 2018-05-13 VITALS — BP 129/61 | HR 65 | Ht 65.0 in | Wt 179.0 lb

## 2018-05-13 DIAGNOSIS — Z1159 Encounter for screening for other viral diseases: Secondary | ICD-10-CM

## 2018-05-13 DIAGNOSIS — Z1231 Encounter for screening mammogram for malignant neoplasm of breast: Secondary | ICD-10-CM

## 2018-05-13 DIAGNOSIS — I10 Essential (primary) hypertension: Secondary | ICD-10-CM

## 2018-05-13 DIAGNOSIS — E782 Mixed hyperlipidemia: Secondary | ICD-10-CM | POA: Diagnosis not present

## 2018-05-13 MED ORDER — LISINOPRIL 40 MG PO TABS: 40.0000 mg | ORAL_TABLET | Freq: Every day | ORAL | 1 refills | Status: DC

## 2018-05-13 MED ORDER — BISOPROLOL-HYDROCHLOROTHIAZIDE 10-6.25 MG PO TABS: 1.0000 | ORAL_TABLET | Freq: Every day | ORAL | 1 refills | Status: DC

## 2018-05-13 NOTE — Progress Notes (Signed)
Subjective:    CC: HTN  HPI:  Hypertension- Pt denies chest pain, SOB, dizziness, or heart palpitations.  Taking meds as directed w/o problems.  Denies medication side effects.  She feels like overall she does well with her diet.  She exercises daily.  She has a new dog who she walks regularly.    She Is doing really well.  She has not had any recurrence of her depression.  She says her daughter Amy Wilson and her husband have lived with her for years and they have finally taken over some more financial responsibility in the home and that has taken a big weight off of her shoulders.  Past medical history, Surgical history, Family history not pertinant except as noted below, Social history, Allergies, and medications have been entered into the medical record, reviewed, and corrections made.   Review of Systems: No fevers, chills, night sweats, weight loss, chest pain, or shortness of breath.   Objective:    General: Well Developed, well nourished, and in no acute distress.  Neuro: Alert and oriented x3, extra-ocular muscles intact, sensation grossly intact.  HEENT: Normocephalic, atraumatic  Skin: Warm and dry, no rashes. Cardiac: Regular rate and rhythm, no murmurs rubs or gallops, no lower extremity edema.  Respiratory: Clear to auscultation bilaterally. Not using accessory muscles, speaking in full sentences.   Impression and Recommendations:    HTN - Well controlled. Continue current regimen. Follow up in  6 months.  He will sent to pharmacy.  Due for updated labs.  Lab slip printed and given today.  Hyperlipidemia - due to recheck lipids.    Screen Hep C.  Screening mammogram ordered.  She declined all vaccines today.  Discussed need for colon cancer screening as well.  She did agree to do Cologuard.

## 2018-05-19 ENCOUNTER — Telehealth: Payer: Self-pay | Admitting: Family Medicine

## 2018-05-19 DIAGNOSIS — Z1239 Encounter for other screening for malignant neoplasm of breast: Secondary | ICD-10-CM

## 2018-05-19 NOTE — Telephone Encounter (Signed)
-----   Message from Hali Marry, MD sent at 05/18/2018  8:47 PM EDT ----- Regarding: FW: Diagnostic Mammo Please call pt and let her know Ok to order diag if she is ok with it.  ----- Message ----- From: Antonietta Barcelona Sent: 05/18/2018  10:09 AM To: Hali Marry, MD Subject: Diagnostic Mammo                               Good Morning, Per the above pts prior report from 2018, the pt needed to return for a bilateral diagnostic mammo to f/u before she can return to a screening mammo. Thanks

## 2018-05-19 NOTE — Telephone Encounter (Signed)
Called and spoke with Pt. She is OK with diagnostic mammo order. Will place.  While on the phone Pt advised she stopped her BP Rx. She states she has been "swimmy headed" and fears the BP Rx is making her BP too low. Pt took BP while on the phone with her home machine and it was 111/60. Pt states this is on no BP Rx. Pt requested to come in for evaluation. PCP does not have an OV opening for one week, so scheduled appt with Provider in office tomorrow.

## 2018-05-20 ENCOUNTER — Ambulatory Visit (INDEPENDENT_AMBULATORY_CARE_PROVIDER_SITE_OTHER): Payer: Medicare HMO

## 2018-05-20 ENCOUNTER — Ambulatory Visit (INDEPENDENT_AMBULATORY_CARE_PROVIDER_SITE_OTHER): Payer: Medicare HMO | Admitting: Physician Assistant

## 2018-05-20 ENCOUNTER — Encounter: Payer: Self-pay | Admitting: Physician Assistant

## 2018-05-20 VITALS — BP 160/88 | HR 88 | Temp 98.3°F | Resp 14 | Wt 179.0 lb

## 2018-05-20 DIAGNOSIS — R42 Dizziness and giddiness: Secondary | ICD-10-CM | POA: Diagnosis not present

## 2018-05-20 DIAGNOSIS — I10 Essential (primary) hypertension: Secondary | ICD-10-CM | POA: Diagnosis not present

## 2018-05-20 DIAGNOSIS — F411 Generalized anxiety disorder: Secondary | ICD-10-CM | POA: Diagnosis not present

## 2018-05-20 DIAGNOSIS — G3281 Cerebellar ataxia in diseases classified elsewhere: Secondary | ICD-10-CM | POA: Diagnosis not present

## 2018-05-20 DIAGNOSIS — E782 Mixed hyperlipidemia: Secondary | ICD-10-CM | POA: Diagnosis not present

## 2018-05-20 DIAGNOSIS — Z1159 Encounter for screening for other viral diseases: Secondary | ICD-10-CM | POA: Diagnosis not present

## 2018-05-20 LAB — COMPLETE METABOLIC PANEL WITH GFR
AG Ratio: 1.3 (calc) (ref 1.0–2.5)
ALT: 18 U/L (ref 6–29)
AST: 23 U/L (ref 10–35)
Albumin: 4.3 g/dL (ref 3.6–5.1)
Alkaline phosphatase (APISO): 75 U/L (ref 33–130)
BUN: 20 mg/dL (ref 7–25)
CO2: 25 mmol/L (ref 20–32)
CREATININE: 0.87 mg/dL (ref 0.50–0.99)
Calcium: 9.3 mg/dL (ref 8.6–10.4)
Chloride: 106 mmol/L (ref 98–110)
GFR, EST NON AFRICAN AMERICAN: 68 mL/min/{1.73_m2} (ref >=60)
GFR, Est African American: 79 mL/min/{1.73_m2} (ref >=60)
GLOBULIN: 3.4 g/dL (ref 1.9–3.7)
Glucose, Bld: 92 mg/dL (ref 65–99)
Potassium: 4.3 mmol/L (ref 3.5–5.3)
SODIUM: 140 mmol/L (ref 135–146)
Total Bilirubin: 0.5 mg/dL (ref 0.2–1.2)
Total Protein: 7.7 g/dL (ref 6.1–8.1)

## 2018-05-20 LAB — TSH: TSH: 4.24 m[IU]/L (ref 0.40–4.50)

## 2018-05-20 LAB — LIPID PANEL
CHOL/HDL RATIO: 5.1 (calc) — AB (ref <5.0)
Cholesterol: 203 mg/dL — ABNORMAL HIGH (ref <200)
HDL: 40 mg/dL — ABNORMAL LOW (ref >50)
LDL Cholesterol (Calc): 145 mg/dL (calc) — ABNORMAL HIGH
NON-HDL CHOLESTEROL (CALC): 163 mg/dL — AB (ref <130)
Triglycerides: 79 mg/dL (ref <150)

## 2018-05-20 NOTE — Patient Instructions (Signed)
Re-start your blood pressure medication. Your BP goal is 643-329 systolic (top) over 51-88 diastolic (bottom). As long as you are in this range, it is normal Drink at least 1.5 L of water / clear fluids Avoid alcohol and any sedating medications Take your time with position changes. Sit on the edge of the bed for several seconds before standing Do not drive while you are feeling dizzy or having vision changes Practice some relaxation exercises  Dizziness Dizziness is a common problem. It is a feeling of unsteadiness or light-headedness. You may feel like you are about to faint. Dizziness can lead to injury if you stumble or fall. Anyone can become dizzy, but dizziness is more common in older adults. This condition can be caused by a number of things, including medicines, dehydration, or illness. Follow these instructions at home: Eating and drinking  Drink enough fluid to keep your urine clear or pale yellow. This helps to keep you from becoming dehydrated. Try to drink more clear fluids, such as water.  Do not drink alcohol.  Limit your caffeine intake if told to do so by your health care provider. Check ingredients and nutrition facts to see if a food or beverage contains caffeine.  Limit your salt (sodium) intake if told to do so by your health care provider. Check ingredients and nutrition facts to see if a food or beverage contains sodium. Activity  Avoid making quick movements. ? Rise slowly from chairs and steady yourself until you feel okay. ? In the morning, first sit up on the side of the bed. When you feel okay, stand slowly while you hold onto something until you know that your balance is fine.  If you need to stand in one place for a long time, move your legs often. Tighten and relax the muscles in your legs while you are standing.  Do not drive or use heavy machinery if you feel dizzy.  Avoid bending down if you feel dizzy. Place items in your home so that they are easy for  you to reach without leaning over. Lifestyle  Do not use any products that contain nicotine or tobacco, such as cigarettes and e-cigarettes. If you need help quitting, ask your health care provider.  Try to reduce your stress level by using methods such as yoga or meditation. Talk with your health care provider if you need help to manage your stress. General instructions  Watch your dizziness for any changes.  Take over-the-counter and prescription medicines only as told by your health care provider. Talk with your health care provider if you think that your dizziness is caused by a medicine that you are taking.  Tell a friend or a family member that you are feeling dizzy. If he or she notices any changes in your behavior, have this person call your health care provider.  Keep all follow-up visits as told by your health care provider. This is important. Contact a health care provider if:  Your dizziness does not go away.  Your dizziness or light-headedness gets worse.  You feel nauseous.  You have reduced hearing.  You have new symptoms.  You are unsteady on your feet or you feel like the room is spinning. Get help right away if:  You vomit or have diarrhea and are unable to eat or drink anything.  You have problems talking, walking, swallowing, or using your arms, hands, or legs.  You feel generally weak.  You are not thinking clearly or you have trouble forming  sentences. It may take a friend or family member to notice this.  You have chest pain, abdominal pain, shortness of breath, or sweating.  Your vision changes.  You have any bleeding.  You have a severe headache.  You have neck pain or a stiff neck.  You have a fever. These symptoms may represent a serious problem that is an emergency. Do not wait to see if the symptoms will go away. Get medical help right away. Call your local emergency services (911 in the [place] Do not drive yourself to the  hospital. Summary  Dizziness is a feeling of unsteadiness or light-headedness. This condition can be caused by a number of things, including medicines, dehydration, or illness.  Anyone can become dizzy, but dizziness is more common in older adults.  Drink enough fluid to keep your urine clear or pale yellow. Do not drink alcohol.  Avoid making quick movements if you feel dizzy. Monitor your dizziness for any changes. This information is not intended to replace advice given to you by your health care provider. Make sure you discuss any questions you have with your health care provider. Document Released: 06/02/2001 Document Revised: 01/09/2017 Document Reviewed: 01/09/2017 Elsevier Interactive Patient Education  [employer].

## 2018-05-20 NOTE — Progress Notes (Signed)
HPI:                                                                Amy Wilson is a 67 y.o. female who presents to [facility]: [place] today for dizziness  This is a 67 yo F with PMH of HTN, fatigue, vasovagal syncope, and dizziness who reports waking up 6 days ago with sudden onset dizziness described as feeling "swimmy headed," "room spinning" and "drunk." Symptoms were also associated with fatigue, blurred vision, and gait imbalance. She checked her BP at home. BP was 111/70, which she reports is low for her. She self-discontinued her blood pressure medication to see if it would help. Reports dizziness improved with rest and lying still. She stayed in bed for most of this week and has not been able to resume her normal activities. Complains today mainly of feeling tired, blurred vision and off-balance. Denies nausea/vomiting. Denies history of vertigo.  Denies headache, focal weakness, numbness/tingling, falls, syncope. Denies fevers, chills, chest pain, dyspnea.   Depression screen [facility] 2/9 05/13/2018 06/19/2016  Decreased Interest 0 0  Down, Depressed, Hopeless 0 0  PHQ - 2 Score 0 0    No flowsheet data found.    History reviewed. No pertinent past medical history. Past Surgical History:  Procedure Laterality Date  . KNEE ARTHROSCOPY Right    Social History   Tobacco Use  . Smoking status: Never Smoker  . Smokeless tobacco: Never Used  Substance Use Topics  . Alcohol use: Not on file   family history is not on file.    ROS: negative except as noted in the HPI  Medications: Current Outpatient Medications  Medication Sig Dispense Refill  . bisoprolol-hydrochlorothiazide (ZIAC) 10-6.25 MG tablet Take 1 tablet by mouth daily. (Patient not taking: Reported on 05/20/2018) 90 tablet 1  . lisinopril (PRINIVIL,ZESTRIL) 40 MG tablet Take 1 tablet (40 mg total) by mouth daily. (Patient not taking: Reported on 05/20/2018) 90 tablet 1    No current facility-administered medications for this visit.    No Known Allergies     Objective:  BP (!) 160/88   Pulse 88   Temp 98.3 F (36.8 C)   Resp 14   Wt 179 lb (81.2 kg)   BMI 29.79 kg/m  Gen: appears drowsy and ill, sighing frequently, not toxic-appearing, no acute distress HEENT: head normocephalic, atraumatic; conjunctiva and cornea clear, visual acuity 20/25 OD-OS-b/l, oropharynx clear, moist mucus membranes; neck supple, no meningeal signs Pulm: Normal work of breathing, normal phonation, clear to auscultation bilaterally CV: Normal rate, regular rhythm, s1 and s2 distinct, no murmurs, clicks or rubs Neuro:  cranial nerves II-XII intact, no nystagmus, abnormal finger-to-nose, intention tremor, normal heel-to-shin, negative pronator drift, normal rapid alternating movements, DTR's intact, normal tone MSK: strength 5/5 and symmetric in bilateral upper and lower extremities, normal gait and station, positive Romberg Mental Status: drowsy and oriented x 3, speech articulate, thought processes clear and goal-directed    No results found for this or any previous visit (from the past 72 hour(s)). No results found.    Assessment and Plan: 67 y.o. female with   Episode of dizziness  Cerebellar ataxia in diseases classified elsewhere [facility]) - Plan: CT Head Wo Contrast  Anxiety  state   Focal neurologic deficits on exam including positive Romberg, abnormal finger-to-nose. Patient appears drowsy and unwell. Vitals are stable apart from elevated blood pressure. Stat CT Head w/o ordered  Personally reviewed CT Head, which was negative for acute intracranial abnormality Spoke with patient's PCP, who states these vertiginous episodes have been a recurrent problem for years and are related to stress/anxiety  Recommended patient re-start her antihypertensive medication, hydrate, take time with position changes, and practice relaxation exercises. Instructed that she should  not drive until symptoms completely resolve. Daughter picked patient up from office today.  Patient education and anticipatory guidance given Patient agrees with treatment plan Follow-up with PCP in 1 week or sooner as needed if symptoms worsen or fail to improve  I spent 40 minutes with this patient, greater than 50% was face-to-face time counseling regarding the above diagnoses  Darlyne Russian PA-C

## 2018-05-21 LAB — HEPATITIS C ANTIBODY
Hepatitis C Ab: NONREACTIVE
SIGNAL TO CUT-OFF: 0.03 (ref <1.00)

## 2018-06-04 ENCOUNTER — Other Ambulatory Visit: Payer: Self-pay | Admitting: Family Medicine

## 2018-06-04 DIAGNOSIS — Z1239 Encounter for other screening for malignant neoplasm of breast: Secondary | ICD-10-CM

## 2018-06-22 ENCOUNTER — Ambulatory Visit (INDEPENDENT_AMBULATORY_CARE_PROVIDER_SITE_OTHER): Payer: Medicare HMO | Admitting: Family Medicine

## 2018-06-22 ENCOUNTER — Ambulatory Visit (INDEPENDENT_AMBULATORY_CARE_PROVIDER_SITE_OTHER): Payer: Medicare HMO

## 2018-06-22 ENCOUNTER — Encounter: Payer: Self-pay | Admitting: Family Medicine

## 2018-06-22 VITALS — BP 161/75 | HR 83 | Wt 179.0 lb

## 2018-06-22 DIAGNOSIS — M25562 Pain in left knee: Secondary | ICD-10-CM

## 2018-06-22 DIAGNOSIS — M25561 Pain in right knee: Secondary | ICD-10-CM

## 2018-06-22 DIAGNOSIS — S83411A Sprain of medial collateral ligament of right knee, initial encounter: Secondary | ICD-10-CM | POA: Diagnosis not present

## 2018-06-22 DIAGNOSIS — M1712 Unilateral primary osteoarthritis, left knee: Secondary | ICD-10-CM | POA: Diagnosis not present

## 2018-06-22 MED ORDER — NAPROXEN 500 MG PO TABS: 500.0000 mg | ORAL_TABLET | Freq: Two times a day (BID) | ORAL | 0 refills | Status: DC | PRN

## 2018-06-22 MED ORDER — DICLOFENAC SODIUM 1 % TD GEL: 4.0000 g | Freq: Four times a day (QID) | TRANSDERMAL | 11 refills | Status: DC

## 2018-06-22 NOTE — Patient Instructions (Addendum)
Thank you for coming in today. Apply the diclofenac gel 4x daily for pain as needed.  Take naproxen 2x daily for pain as needed.  Apply ice (bag of frozen peas works well) for 10-20 mins 2x dialy  Use a hinged knee brace as needed.  Use a cane or a crutch in the left hand a needed.  Recheck with me in 2-4 weeks.    Medial Collateral Knee Ligament Sprain, Phase I Rehab Ask your health care provider which exercises are safe for you. Do exercises exactly as told by your health care provider and adjust them as directed. It is normal to feel mild stretching, pulling, tightness, or discomfort as you do these exercises, but you should stop right away if you feel sudden pain or your pain gets worse.Do not begin these exercises until told by your health care provider. Stretching and range of motion exercises These exercises warm up your muscles and joints and improve the movement and flexibility of your knee. These exercises also help to relieve pain, numbness, and tingling. Exercise A: Knee flexion, passive 1. Start this exercise in one of these positions: ? Lying on the floor in front of an open doorway, with your left / right heel and foot lightly touching the wall. ? Lying on the floor with both feet on the wall. 2. Without using any effort, allow gravity to let your foot slide down the wall slowly until you feel a gentle stretch in the front of your left / right knee. 3. Hold this stretch for __________ seconds. 4. Return your leg to the starting position, using your healthy leg to do the work or to help if needed. Repeat __________ times. Complete this stretch __________ times a day. Exercise B: Knee flexion, active  1. Lie on your back with both knees straight. If this causes back discomfort, bend your healthy knee so your foot is flat on the floor. 2. Slowly slide your left / right heel back toward your buttocks until you feel a gentle stretch in the front of your knee or thigh. 3. Hold this  position for __________ seconds. 4. Slowly slide your left / right heel back to the starting position. Repeat __________ times. Complete this exercise __________ times a day. Exercise C: Knee extension, sitting 1. Sit with your left / right heel propped on a chair, a coffee table, or a footstool. Do not have anything under your knee to support it. 2. Allow your leg muscles to relax, letting gravity straighten out your knee. Do not let your knee roll inward. You should feel a stretch behind your left / right knee. 3. If told by your health care provider, deepen the stretch by placing a __________ weight on your thigh, just above your kneecap. 4. Hold this position for __________ seconds. Repeat __________ times. Complete this stretch __________ times a day. Strengthening exercises These exercises build strength and endurance in your knee. Endurance is the ability to use your muscles for a long time, even after they get tired. Isometric exercises involve squeezing your muscles but not moving your knee. Exercise D: Quadriceps, isometric  1. Lie on your back with your left / right leg extended and your other knee bent. 2. If told by your health care provider, put a rolled towel or small pillow under your left / right knee. 3. Slowly tense the muscles in the front of your left / right thigh by pushing your knee down. You should see your kneecap slide up toward your  hip or see increased dimpling just above the knee. 4. For __________ seconds, keep the muscle as tight as you can without increasing your pain. 5. Relax your muscles slowly and completely. Repeat __________ times. Complete this exercise __________ times a day. Exercise E: Hamstring, isometric  1. Lie on your back on a firm surface. 2. Bend your left / right knee about __________ degrees. You can prop your knee on a pillow if needed. 3. Dig your heel down and back into the surface as if you are trying to pull your heel toward your  buttocks. Tighten the muscles in the back of your thighs to "dig" as hard as you can without increasing any pain. 4. Hold this position for __________ seconds. 5. Relax your muscles slowly and completely. Repeat __________ times. Complete this exercise __________ times a day. This information is not intended to replace advice given to you by your health care provider. Make sure you discuss any questions you have with your health care provider. Document Released: 12/07/2005 Document Revised: 08/13/2016 Document Reviewed: 10/19/2015 Elsevier Interactive Patient Education  2018 [place].    Meniscus Tear A meniscus tear is a knee injury in which a piece of the meniscus is torn. The meniscus is a thick, rubbery, wedge-shaped cartilage in the knee. Two menisci are located in each knee. They sit between the upper bone (femur) and lower bone (tibia) that make up the knee joint. Each meniscus acts as a shock absorber for the knee. A torn meniscus is one of the most common types of knee injuries. This injury can range from mild to severe. Surgery may be needed for a severe tear. What are the causes? This injury may be caused by any squatting, twisting, or pivoting movement. Sports-related injuries are the most common cause. These often occur from:  Running and stopping suddenly.  Changing direction.  Being tackled or knocked off your feet.  As people get older, their meniscus gets thinner and weaker. In these people, tears can happen more easily, such as from climbing stairs. What increases the risk? This injury is more likely to happen to:  People who play contact sports.  Males.  People who are 69 years of age.  What are the signs or symptoms? Symptoms of this injury include:  Knee pain, especially at the side of the knee joint. You may feel pain when the injury occurs, or you may only hear a pop and feel pain later.  A feeling that your knee is clicking, catching, locking, or  giving way.  Not being able to fully bend or extend your knee.  Bruising or swelling in your knee.  How is this diagnosed? This injury may be diagnosed based on your symptoms and a physical exam. The physical exam may include:  Moving your knee in different ways.  Feeling for tenderness.  Listening for a clicking sound.  Checking if your knee locks or catches.  You may also have tests, such as:  X-rays.  MRI.  A procedure to look inside your knee with a narrow surgical telescope (arthroscopy).  You may be referred to a knee specialist (orthopedic surgeon). How is this treated? Treatment for this injury depends on the severity of the tear. Treatment for a mild tear may include:  Rest.  Medicine to reduce pain and swelling. This is usually a nonsteroidal anti-inflammatory drug (NSAID).  A knee brace or an elastic sleeve or wrap.  Using crutches or a walker to keep weight off your knee and  to help you walk.  Exercises to strengthen your knee (physical therapy).  You may need surgery if you have a severe tear or if other treatments are not working. Follow these instructions at home: Managing pain and swelling  Take over-the-counter and prescription medicines only as told by your health care provider.  If directed, apply ice to the injured area: ? Put ice in a plastic bag. ? Place a towel between your skin and the bag. ? Leave the ice on for 20 minutes, 2-3 times per day.  Raise (elevate) the injured area above the level of your heart while you are sitting or lying down. Activity  Do not use the injured limb to support your body weight until your health care provider says that you can. Use crutches or a walker as told by your health care provider.  Return to your normal activities as told by your health care provider. Ask your health care provider what activities are safe for you.  Perform range-of-motion exercises only as told by your health care  provider.  Begin doing exercises to strengthen your knee and leg muscles only as told by your health care provider. After you recover, your health care provider may recommend these exercises to help prevent another injury. General instructions  Use a knee brace or elastic wrap as told by your health care provider.  Keep all follow-up visits as told by your health care provider. This is important. Contact a health care provider if:  You have a fever.  Your knee becomes red, tender, or swollen.  Your pain medicine is not helping.  Your symptoms get worse or do not improve after 2 weeks of home care. This information is not intended to replace advice given to you by your health care provider. Make sure you discuss any questions you have with your health care provider. Document Released: 02/27/2003 Document Revised: 05/14/2016 Document Reviewed: 04/01/2015 Elsevier Interactive Patient Education  [employer].

## 2018-06-23 NOTE — Progress Notes (Signed)
Amy Wilson is a 67 y.o. female who presents to [facility] today for right knee pain.  Amy Wilson has right knee pain present for the last 4 days.  She notes that she was walking and felt her knee twist.  She denies any specific pop catch her fall.  She notes that it swelled and became painful within a few hours of the onset of her pain.  She has a pertinent surgical history for right knee arthroscopic surgery years ago.  She is unsure exactly what was done.  Over the last several days as her pain is been worsening she is been trying to use over-the-counter medications for pain which have not been very helpful.  She denies locking or catching but does note significant swelling and pain.  She points to the medial aspect of her knee is area of most tenderness.    ROS:  As above  Exam:  BP (!) 161/75   Pulse 83   Wt 179 lb (81.2 kg)   BMI 29.79 kg/m  General: Well Developed, well nourished, and in no acute distress.  Neuro/Psych: Alert and oriented x3, extra-ocular muscles intact, able to move all 4 extremities, sensation grossly intact. Skin: Warm and dry, no rashes noted.  Respiratory: Not using accessory muscles, speaking in full sentences, trachea midline.  Cardiovascular: Pulses palpable, no extremity edema. Abdomen: Does not appear distended. MSK:  Right knee: Moderate effusion present no erythema or exudate or ecchymosis. Range of motion 5-100 degrees. Tender palpation medial joint line.  Nontender otherwise. Intact flexion and extension strength. Significantly positive medial McMurray's test.  Lateral McMurray's test MCL stress test produces pain without laxity.  LCL stress test is normal. Guarding with anterior drawer testing but no significant laxity felt.  Negative posterior drawer testing. Antalgic gait present.  Contralateral left knee normal-appearing nontender full motion stable ligaments exam.  Normal strength.     Lab  and Radiology Results No results found for this or any previous visit (from the past 72 hour(s)). Dg Knee 1-2 Views Left  Result Date: 06/22/2018 CLINICAL DATA:  Acute right knee pain EXAM: LEFT KNEE - 1-2 VIEW COMPARISON:  None. FINDINGS: No acute fracture. No dislocation. Unremarkable soft tissues. Mild degenerative change. IMPRESSION: No acute bony pathology. Electronically Signed   By: Marybelle Killings M.D.   On: 06/22/2018 16:40   Dg Knee Complete 4 Views Right  Result Date: 06/22/2018 CLINICAL DATA:  Extreme acute right knee pain and tenderness. EXAM: RIGHT KNEE - COMPLETE 4+ VIEW COMPARISON:  None. FINDINGS: No acute fracture. No dislocation. Mild tricompartment osteoarthritic change. IMPRESSION: No acute bony pathology. Electronically Signed   By: Marybelle Killings M.D.   On: 06/22/2018 16:47   I personally (independently) visualized and performed the interpretation of the images attached in this note.     Assessment and Plan: 67 y.o. female with  Right knee pain.  Pain at medial joint line running for grade 1 MCL strain versus medial meniscus injury or both.  Plan for hinged knee brace, close and actually naproxen.  Patient very reluctant to consider injection today.  Recheck in 2-4 weeks.  Return sooner if needed.  Warning signs or symptoms were reviewed with patient.    Orders Placed This Encounter  Procedures  . DG Knee Complete 4 Views Right    Please include patellar sunrise, lateral, and weightbearing bilateral AP and bilateral rosenberg views    Standing Status:   Future    Number of  Occurrences:   1    Standing Expiration Date:   08/22/2019    Order Specific Question:   Reason for exam:    Answer:   Please include patellar sunrise, lateral, and weightbearing bilateral AP and bilateral rosenberg views    Comments:   Please include patellar sunrise, lateral, and weightbearing bilateral AP and bilateral rosenberg views    Order Specific Question:   Preferred imaging location?     Answer:   [facility]  . DG Knee 1-2 Views Left    Standing Status:   Future    Number of Occurrences:   1    Standing Expiration Date:   08/23/2019    Order Specific Question:   Reason for Exam (SYMPTOM  OR DIAGNOSIS REQUIRED)    Answer:   For use with right knee x-ray, bilateral AP and Rosenberg standing.    Order Specific Question:   Preferred imaging location?    Answer:   [facility]   Meds ordered this encounter  Medications  . naproxen (NAPROSYN) 500 MG tablet    Sig: Take 1 tablet (500 mg total) by mouth 2 (two) times daily as needed for moderate pain.    Dispense:  28 tablet    Refill:  0  . diclofenac sodium (VOLTAREN) 1 % GEL    Sig: Apply 4 g topically 4 (four) times daily. To affected joint.    Dispense:  100 g    Refill:  11    Historical information moved to improve visibility of documentation.  No past medical history on file. Past Surgical History:  Procedure Laterality Date  . KNEE ARTHROSCOPY Right    Social History   Tobacco Use  . Smoking status: Never Smoker  . Smokeless tobacco: Never Used  Substance Use Topics  . Alcohol use: Not on file   family history is not on file.  Medications: Current Outpatient Medications  Medication Sig Dispense Refill  . bisoprolol-hydrochlorothiazide (ZIAC) 10-6.25 MG tablet Take 1 tablet by mouth daily. 90 tablet 1  . lisinopril (PRINIVIL,ZESTRIL) 40 MG tablet Take 1 tablet (40 mg total) by mouth daily. 90 tablet 1  . diclofenac sodium (VOLTAREN) 1 % GEL Apply 4 g topically 4 (four) times daily. To affected joint. 100 g 11  . naproxen (NAPROSYN) 500 MG tablet Take 1 tablet (500 mg total) by mouth 2 (two) times daily as needed for moderate pain. 28 tablet 0   No current facility-administered medications for this visit.    No Known Allergies    Discussed warning signs or symptoms. Please see discharge instructions. Patient expresses understanding.

## 2018-09-06 ENCOUNTER — Encounter: Payer: Self-pay | Admitting: Family Medicine

## 2018-09-06 ENCOUNTER — Ambulatory Visit (INDEPENDENT_AMBULATORY_CARE_PROVIDER_SITE_OTHER): Payer: Medicare HMO | Admitting: Family Medicine

## 2018-09-06 VITALS — BP 137/65 | HR 66 | Ht 65.0 in | Wt 177.0 lb

## 2018-09-06 DIAGNOSIS — M8949 Other hypertrophic osteoarthropathy, multiple sites: Secondary | ICD-10-CM

## 2018-09-06 DIAGNOSIS — M159 Polyosteoarthritis, unspecified: Secondary | ICD-10-CM

## 2018-09-06 DIAGNOSIS — M150 Primary generalized (osteo)arthritis: Secondary | ICD-10-CM

## 2018-09-06 DIAGNOSIS — F339 Major depressive disorder, recurrent, unspecified: Secondary | ICD-10-CM

## 2018-09-06 MED ORDER — ESCITALOPRAM OXALATE 10 MG PO TABS: ORAL_TABLET | ORAL | 1 refills | Status: DC

## 2018-09-06 NOTE — Progress Notes (Signed)
Subjective:    CC: Acute for mood.    HPI:  67 year old female is here to discuss depression.  She has a prior history of depression but is not been recently treated and is not currently on medication for this.  He says she is actually been feeling down for a while but just has not really done anything about it but she does feel that her symptoms have gotten acutely worse over the past week which is why she came in today.  She says she is really just been struggling really almost for years to just try to keep her symptoms at [place].  She is tried resting a little more she is been exercising and going to the gym.  She just feels like more recently she is just struggling more.  In fact she is actually quit going to the gym which is usually a big motivator for her.  She is just feeling a little overwhelmed she is under a lot of financial stress.  Right now she is basically living off of her Medicare check and says is just not enough.  She is been trying to find a job but cannot find anyone that wants to hire her at age 38.  She is also been dealing with arthritis in her hands shoulders and knees.  She says she really just tries to put up with the pain most of the time and not complaining but says is starting to get her little bit.  She is been a little bit more withdrawn even at home.  Her daughter and spouse live with her.  She complains of little interest and pleasure doing things for nearly every day.  She complains of poor appetite and fatigue nearly every day and difficulty with concentration.  She also is having difficulty relaxing and feeling like she cannot quite control her worry nearly every day.  She also now has a new dog which she took from a friend of hers but now the dog has dental problems which make her end up costing her money.  Past medical history, Surgical history, Family history not pertinant except as noted below, Social history, Allergies, and medications have been entered into the medical  record, reviewed, and corrections made.   Review of Systems: No fevers, chills, night sweats, weight loss, chest pain, or shortness of breath.   Objective:    General: Well Developed, well nourished, and in no acute distress.  Neuro: Alert and oriented x3, extra-ocular muscles intact, sensation grossly intact.  HEENT: Normocephalic, atraumatic  Skin: Warm and dry, no rashes. Cardiac: Regular rate and rhythm, no murmurs rubs or gallops, no lower extremity edema.  Respiratory: Clear to auscultation bilaterally. Not using accessory muscles, speaking in full sentences.   Impression and Recommendations:   Depression, recurrent uncontrolled.  PHQ 9 score of 20.  And gad 7 score of 14.  Discussed options.  At this point I really think we should try it medication.  She is actually been struggling with depression on and off ever since have known her and is never really taken a medication for it.  We will go ahead and start with Lexapro.  Like to see her back in a month.  Warned about potential side effects she will call back if she has any problems or concerns.  We also discussed therapy and counseling which I think would actually be really beneficial for her but right now I think may end up being more of a financial strains I encouraged her  to call her insurance and check on what her co-pays would be.  If she would like to do it she is more than welcome to call me back and I will place a referral.  She is also a member of a local large church in the area and so also encouraged her to reach out to them as they may actually have some free counseling and therapy available through her church.   Arthritis of multiple joints-she does have a prescription for Voltaren gel.  She also has a prescription for naproxen to use as needed as well.  Time spent 25 minutes, greater than 50% of time spent face-to-face counseling about depression and treatment options.

## 2018-09-28 ENCOUNTER — Other Ambulatory Visit: Payer: Self-pay | Admitting: Family Medicine

## 2018-10-04 ENCOUNTER — Encounter: Payer: Self-pay | Admitting: Family Medicine

## 2018-10-04 ENCOUNTER — Ambulatory Visit (INDEPENDENT_AMBULATORY_CARE_PROVIDER_SITE_OTHER): Payer: Medicare HMO | Admitting: Family Medicine

## 2018-10-04 VITALS — BP 135/82 | HR 75 | Ht 65.0 in | Wt 176.0 lb

## 2018-10-04 DIAGNOSIS — F339 Major depressive disorder, recurrent, unspecified: Secondary | ICD-10-CM

## 2018-10-04 DIAGNOSIS — I10 Essential (primary) hypertension: Secondary | ICD-10-CM

## 2018-10-04 MED ORDER — ESCITALOPRAM OXALATE 10 MG PO TABS: 10.0000 mg | ORAL_TABLET | Freq: Every day | ORAL | 1 refills | Status: DC

## 2018-10-04 NOTE — Progress Notes (Signed)
Subjective:    CC: Mood  HPI:  Hypertension- Pt denies chest pain, SOB, dizziness, or heart palpitations.  Taking meds as directed w/o problems.  Denies medication side effects.     Acute depression - she is here for 4 week after new start of antidepressant.  Her last PHQ 9 score was 20 and gad 7 was 14.  We decided to put her on Lexapro.  So discussed therapy as a possibility.  She felt like financially she would be unable to afford it so we discussed reaching out to her local church. She hasn't checked into that yet. She is still struggling to get out of the house. She did go to church this week, for the first time in weeks.  She has not had any side effects from the medication which is great.  Past medical history, Surgical history, Family history not pertinant except as noted below, Social history, Allergies, and medications have been entered into the medical record, reviewed, and corrections made.   Review of Systems: No fevers, chills, night sweats, weight loss, chest pain, or shortness of breath.   Objective:    General: Well Developed, well nourished, and in no acute distress.  Neuro: Alert and oriented x3, extra-ocular muscles intact, sensation grossly intact.  HEENT: Normocephalic, atraumatic  Skin: Warm and dry, no rashes. Cardiac: Regular rate and rhythm, no murmurs rubs or gallops, no lower extremity edema.  Respiratory: Clear to auscultation bilaterally. Not using accessory muscles, speaking in full sentences.   Impression and Recommendations:    HTN - Well controlled. Continue current regimen. Follow up in  6 months.    Acute Depression -PHQ 9 score now down to 6 from previous of 20.  And gad 7 score down to 3 from previous of 14.  Significant improvement from previous though she still rates her symptoms is somewhat difficult.  Much improved from previous though she is still struggling with being more social and getting out of the house.  We will continue with Lexapro 10  mg for now and I will see her back in 6 weeks.  She has any problems in between let us know.  Refilled medication for 90-day supply.

## 2018-11-05 ENCOUNTER — Other Ambulatory Visit: Payer: Self-pay | Admitting: Family Medicine

## 2018-11-05 DIAGNOSIS — I10 Essential (primary) hypertension: Secondary | ICD-10-CM

## 2018-11-22 ENCOUNTER — Ambulatory Visit: Payer: Medicare HMO | Admitting: Family Medicine

## 2018-12-07 ENCOUNTER — Telehealth: Payer: Self-pay | Admitting: Family Medicine

## 2018-12-07 NOTE — Telephone Encounter (Signed)
Spoke with patient and advised her that we had received a message that she has not returned the [employer] and she states that she does not want to send it back at this time.

## 2019-02-19 DIAGNOSIS — N138 Other obstructive and reflux uropathy: Secondary | ICD-10-CM | POA: Diagnosis not present

## 2019-02-19 DIAGNOSIS — M5136 Other intervertebral disc degeneration, lumbar region: Secondary | ICD-10-CM | POA: Diagnosis not present

## 2019-02-19 DIAGNOSIS — N1330 Unspecified hydronephrosis: Secondary | ICD-10-CM | POA: Diagnosis not present

## 2019-02-19 DIAGNOSIS — I272 Pulmonary hypertension, unspecified: Secondary | ICD-10-CM | POA: Diagnosis not present

## 2019-02-19 DIAGNOSIS — N179 Acute kidney failure, unspecified: Secondary | ICD-10-CM | POA: Diagnosis not present

## 2019-02-19 DIAGNOSIS — C569 Malignant neoplasm of unspecified ovary: Secondary | ICD-10-CM | POA: Diagnosis not present

## 2019-02-19 DIAGNOSIS — I10 Essential (primary) hypertension: Secondary | ICD-10-CM | POA: Diagnosis not present

## 2019-02-19 DIAGNOSIS — R590 Localized enlarged lymph nodes: Secondary | ICD-10-CM | POA: Diagnosis not present

## 2019-02-19 DIAGNOSIS — Z466 Encounter for fitting and adjustment of urinary device: Secondary | ICD-10-CM | POA: Diagnosis not present

## 2019-02-19 DIAGNOSIS — M549 Dorsalgia, unspecified: Secondary | ICD-10-CM | POA: Diagnosis not present

## 2019-02-19 DIAGNOSIS — N3289 Other specified disorders of bladder: Secondary | ICD-10-CM | POA: Diagnosis not present

## 2019-02-19 DIAGNOSIS — C772 Secondary and unspecified malignant neoplasm of intra-abdominal lymph nodes: Secondary | ICD-10-CM | POA: Diagnosis not present

## 2019-02-19 DIAGNOSIS — K8020 Calculus of gallbladder without cholecystitis without obstruction: Secondary | ICD-10-CM | POA: Diagnosis not present

## 2019-02-19 DIAGNOSIS — C770 Secondary and unspecified malignant neoplasm of lymph nodes of head, face and neck: Secondary | ICD-10-CM | POA: Diagnosis not present

## 2019-02-19 DIAGNOSIS — D259 Leiomyoma of uterus, unspecified: Secondary | ICD-10-CM | POA: Diagnosis not present

## 2019-02-19 DIAGNOSIS — R531 Weakness: Secondary | ICD-10-CM | POA: Diagnosis not present

## 2019-02-19 DIAGNOSIS — M4856XA Collapsed vertebra, not elsewhere classified, lumbar region, initial encounter for fracture: Secondary | ICD-10-CM | POA: Diagnosis not present

## 2019-02-19 DIAGNOSIS — C7982 Secondary malignant neoplasm of genital organs: Secondary | ICD-10-CM | POA: Diagnosis not present

## 2019-02-19 DIAGNOSIS — M47816 Spondylosis without myelopathy or radiculopathy, lumbar region: Secondary | ICD-10-CM | POA: Diagnosis not present

## 2019-02-19 DIAGNOSIS — C539 Malignant neoplasm of cervix uteri, unspecified: Secondary | ICD-10-CM | POA: Diagnosis not present

## 2019-02-19 DIAGNOSIS — I071 Rheumatic tricuspid insufficiency: Secondary | ICD-10-CM | POA: Diagnosis not present

## 2019-02-19 DIAGNOSIS — N130 Hydronephrosis with ureteropelvic junction obstruction: Secondary | ICD-10-CM | POA: Diagnosis not present

## 2019-02-19 DIAGNOSIS — Z452 Encounter for adjustment and management of vascular access device: Secondary | ICD-10-CM | POA: Diagnosis not present

## 2019-02-19 DIAGNOSIS — M8008XA Age-related osteoporosis with current pathological fracture, vertebra(e), initial encounter for fracture: Secondary | ICD-10-CM | POA: Diagnosis not present

## 2019-02-19 DIAGNOSIS — K449 Diaphragmatic hernia without obstruction or gangrene: Secondary | ICD-10-CM | POA: Diagnosis not present

## 2019-02-19 DIAGNOSIS — I700 Atherosclerosis of aorta: Secondary | ICD-10-CM | POA: Diagnosis not present

## 2019-02-19 DIAGNOSIS — C786 Secondary malignant neoplasm of retroperitoneum and peritoneum: Secondary | ICD-10-CM | POA: Diagnosis not present

## 2019-02-19 DIAGNOSIS — N131 Hydronephrosis with ureteral stricture, not elsewhere classified: Secondary | ICD-10-CM | POA: Diagnosis not present

## 2019-02-19 DIAGNOSIS — C541 Malignant neoplasm of endometrium: Secondary | ICD-10-CM | POA: Diagnosis not present

## 2019-02-19 DIAGNOSIS — M5137 Other intervertebral disc degeneration, lumbosacral region: Secondary | ICD-10-CM | POA: Diagnosis not present

## 2019-02-19 DIAGNOSIS — I517 Cardiomegaly: Secondary | ICD-10-CM | POA: Diagnosis not present

## 2019-02-19 DIAGNOSIS — R1900 Intra-abdominal and pelvic swelling, mass and lump, unspecified site: Secondary | ICD-10-CM | POA: Diagnosis not present

## 2019-02-20 ENCOUNTER — Ambulatory Visit: Payer: Medicare HMO | Admitting: Family Medicine

## 2019-02-20 DIAGNOSIS — I700 Atherosclerosis of aorta: Secondary | ICD-10-CM | POA: Insufficient documentation

## 2019-02-20 DIAGNOSIS — D259 Leiomyoma of uterus, unspecified: Secondary | ICD-10-CM | POA: Insufficient documentation

## 2019-02-20 DIAGNOSIS — N3289 Other specified disorders of bladder: Secondary | ICD-10-CM | POA: Insufficient documentation

## 2019-02-20 DIAGNOSIS — R1900 Intra-abdominal and pelvic swelling, mass and lump, unspecified site: Secondary | ICD-10-CM | POA: Insufficient documentation

## 2019-02-20 DIAGNOSIS — I10 Essential (primary) hypertension: Secondary | ICD-10-CM | POA: Insufficient documentation

## 2019-02-20 DIAGNOSIS — K449 Diaphragmatic hernia without obstruction or gangrene: Secondary | ICD-10-CM | POA: Insufficient documentation

## 2019-02-21 DIAGNOSIS — I272 Pulmonary hypertension, unspecified: Secondary | ICD-10-CM | POA: Insufficient documentation

## 2019-02-23 DIAGNOSIS — W19XXXA Unspecified fall, initial encounter: Secondary | ICD-10-CM | POA: Insufficient documentation

## 2019-02-24 DIAGNOSIS — M8000XD Age-related osteoporosis with current pathological fracture, unspecified site, subsequent encounter for fracture with routine healing: Secondary | ICD-10-CM | POA: Insufficient documentation

## 2019-02-24 DIAGNOSIS — S32010A Wedge compression fracture of first lumbar vertebra, initial encounter for closed fracture: Secondary | ICD-10-CM | POA: Insufficient documentation

## 2019-03-01 ENCOUNTER — Inpatient Hospital Stay: Payer: Medicare HMO | Admitting: Family Medicine

## 2019-03-02 DIAGNOSIS — C541 Malignant neoplasm of endometrium: Secondary | ICD-10-CM | POA: Insufficient documentation

## 2019-03-03 DIAGNOSIS — C541 Malignant neoplasm of endometrium: Secondary | ICD-10-CM | POA: Insufficient documentation

## 2019-03-04 DIAGNOSIS — C541 Malignant neoplasm of endometrium: Secondary | ICD-10-CM | POA: Diagnosis not present

## 2019-03-05 DIAGNOSIS — Z960 Presence of urogenital implants: Secondary | ICD-10-CM | POA: Diagnosis not present

## 2019-03-05 DIAGNOSIS — I10 Essential (primary) hypertension: Secondary | ICD-10-CM | POA: Diagnosis not present

## 2019-03-05 DIAGNOSIS — N1330 Unspecified hydronephrosis: Secondary | ICD-10-CM | POA: Diagnosis not present

## 2019-03-05 DIAGNOSIS — M8008XD Age-related osteoporosis with current pathological fracture, vertebra(e), subsequent encounter for fracture with routine healing: Secondary | ICD-10-CM | POA: Diagnosis not present

## 2019-03-05 DIAGNOSIS — C541 Malignant neoplasm of endometrium: Secondary | ICD-10-CM | POA: Diagnosis not present

## 2019-03-06 ENCOUNTER — Telehealth: Payer: Self-pay

## 2019-03-06 DIAGNOSIS — Z960 Presence of urogenital implants: Secondary | ICD-10-CM | POA: Diagnosis not present

## 2019-03-06 DIAGNOSIS — C541 Malignant neoplasm of endometrium: Secondary | ICD-10-CM | POA: Diagnosis not present

## 2019-03-06 DIAGNOSIS — M8008XD Age-related osteoporosis with current pathological fracture, vertebra(e), subsequent encounter for fracture with routine healing: Secondary | ICD-10-CM | POA: Diagnosis not present

## 2019-03-06 DIAGNOSIS — I10 Essential (primary) hypertension: Secondary | ICD-10-CM | POA: Diagnosis not present

## 2019-03-06 DIAGNOSIS — N1330 Unspecified hydronephrosis: Secondary | ICD-10-CM | POA: Diagnosis not present

## 2019-03-06 NOTE — Telephone Encounter (Signed)
OK for verbal order 

## 2019-03-06 NOTE — Telephone Encounter (Signed)
Amy Wilson advised

## 2019-03-06 NOTE — Telephone Encounter (Signed)
Samantha from [facility], called nothing they visited pt this weekend after discharge for endometrial cancer.   Requesting OK for VO to have nurse check on patient: 2 week 2, 1 week 2, then every other week.   Note: pt has HFU appt with [facility] on 03/09/19

## 2019-03-07 ENCOUNTER — Telehealth: Payer: Self-pay

## 2019-03-07 NOTE — Telephone Encounter (Signed)
Agree with documentation as above.   Manish Ruggiero, MD  

## 2019-03-07 NOTE — Telephone Encounter (Signed)
Danella Penton., physical therapist with [facility], called requesting OK to continue PT 2 times a week for 4 weeks.   VO given on VM.

## 2019-03-08 ENCOUNTER — Other Ambulatory Visit: Payer: Self-pay | Admitting: *Deleted

## 2019-03-09 ENCOUNTER — Encounter: Payer: Self-pay | Admitting: Family Medicine

## 2019-03-09 ENCOUNTER — Ambulatory Visit (INDEPENDENT_AMBULATORY_CARE_PROVIDER_SITE_OTHER): Payer: Medicare HMO | Admitting: Family Medicine

## 2019-03-09 ENCOUNTER — Other Ambulatory Visit: Payer: Self-pay

## 2019-03-09 ENCOUNTER — Telehealth: Payer: Self-pay | Admitting: Family Medicine

## 2019-03-09 VITALS — BP 106/72 | HR 120 | Temp 98.5°F | Ht 65.0 in | Wt 164.0 lb

## 2019-03-09 DIAGNOSIS — I10 Essential (primary) hypertension: Secondary | ICD-10-CM

## 2019-03-09 DIAGNOSIS — C541 Malignant neoplasm of endometrium: Secondary | ICD-10-CM | POA: Diagnosis not present

## 2019-03-09 DIAGNOSIS — N179 Acute kidney failure, unspecified: Secondary | ICD-10-CM

## 2019-03-09 DIAGNOSIS — M8000XD Age-related osteoporosis with current pathological fracture, unspecified site, subsequent encounter for fracture with routine healing: Secondary | ICD-10-CM | POA: Diagnosis not present

## 2019-03-09 DIAGNOSIS — Z23 Encounter for immunization: Secondary | ICD-10-CM | POA: Diagnosis not present

## 2019-03-09 NOTE — Assessment & Plan Note (Signed)
She is not able to take the calcium tabs so recommend Viactiv chews which also have the vitamin D included.  And they can also buy some over-the-counter chewable vitamin D to take extra.

## 2019-03-09 NOTE — Telephone Encounter (Signed)
Ok for order?  

## 2019-03-09 NOTE — Patient Instructions (Addendum)
Recommend try the Viative chews to get in your calcium and vitamin D.  [employer] also makes a chewable vitamin D as well. Check [employer].   Cut the Amlodipine in half to 2.5 mg and check your BP once a day over the weekend and send me your results on MyChart so we can adjust your BP medication.

## 2019-03-09 NOTE — Assessment & Plan Note (Signed)
Blood pressure was low today.  Can have her cut her amlodipine in half and take a half a tab daily through the weekend I want her to check her blood pressure at home she does have a cough and let me know on Monday through my chart what her blood pressures have been doing.  If they are still running a little low then we can discontinue the amlodipine.  That could be contributing to some of the weakness that she has been experiencing as well as lightheadedness.

## 2019-03-09 NOTE — Progress Notes (Signed)
Acute Office Visit  Subjective:    Patient ID: Amy Wilson, female    DOB: 05/22/1951, 68 y.o.   MRN: [id number]  Chief Complaint  Patient presents with  . Hospitalization Follow-up    pt reports that she feels tired/weak no appetite but this has gotten better, she tries to drink 2-3 boost supplemental drinks qd, she would like Dr. Madilyn Fireman to check her port they called the [facility] w[facility] but they haven't called back. she will go 3/23 for her labs and her 1st chemo tx will be on 3/25    HPI Patient is in today for hospital follow-up.  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  She also had an acute kidney injury due to obstructive nephropathy from the mass.  Did not require dialysis but had bilateral ureteral stents placed on March 2.  Because of the acute kidney injury she was actually told to hold her ACE inhibitor and continue her Lopressor.  She was started on amlodipine 5 mg daily instead.  Is being home from the hospital she is just been very weak and very tired.  Really that started even before she was hospitalized before she was diagnosed.  She has felt short of breath with activity as well.  She can barely get to her bathroom and back to her bedroom at home.  She denies any dizziness or lightheadedness.  She was also recently diagnosed with moderate to severe pulmonary hypertension felt to be likely secondary to sleep apnea.  Transthoracic echocardiogram on March 2 showed no diastolic dysfunction and no significant valvular disease.  Left ventricular EF was 60 to 65%.  Recommendation was for CPAP at bedtime sleep study referral was placed on discharge orders.  In regards to depression she was told to continue her Lexapro.  [place] while in the hospital after passing out and fractured a bone in her bottom she says.  They evaluated her and diagnosed her with osteoporosis and have recommended calcium and vitamin D but she says she cannot swallow large pills and so  has not started the calcium yet.  No past medical history on file.  Past Surgical History:  Procedure Laterality Date  . KNEE ARTHROSCOPY Right     No family history on file.  Social History   Socioeconomic History  . Marital status: Divorced    Spouse name: Not on file  . Number of children: Not on file  . Years of education: Not on file  . Highest education level: Not on file  Occupational History  . Not on file  Social Needs  . Financial resource strain: Not on file  . Food insecurity:    Worry: Not on file    Inability: Not on file  . Transportation needs:    Medical: Not on file    Non-medical: Not on file  Tobacco Use  . Smoking status: Never Smoker  . Smokeless tobacco: Never Used  Substance and Sexual Activity  . Alcohol use: Not on file  . Drug use: No  . Sexual activity: Never  Lifestyle  . Physical activity:    Days per week: Not on file    Minutes per session: Not on file  . Stress: Not on file  Relationships  . Social connections:    Talks on phone: Not on file    Gets together: Not on file    Attends religious service: Not on file    Active member of club or organization: Not on file  Attends meetings of clubs or organizations: Not on file    Relationship status: Not on file  . Intimate partner violence:    Fear of current or ex partner: Not on file    Emotionally abused: Not on file    Physically abused: Not on file    Forced sexual activity: Not on file  Other Topics Concern  . Not on file  Social History Narrative  . Not on file    Outpatient Medications Prior to Visit  Medication Sig Dispense Refill  . amLODipine (NORVASC) 5 MG tablet Take 1 tablet by mouth daily.    . calcium carbonate (OS-CAL - DOSED IN MG OF ELEMENTAL CALCIUM) 1250 (500 Ca) MG tablet Take 1 tablet by mouth daily.    . cholecalciferol (VITAMIN D) 25 MCG (1000 UT) tablet Take 1 tablet by mouth daily.    Marland Kitchen escitalopram (LEXAPRO) 10 MG tablet Take 1 tablet (10 mg  total) by mouth daily. 90 tablet 1  . HYDROmorphone (DILAUDID) 2 MG tablet Take 1 tablet by mouth every 4 (four) hours as needed.    . metoprolol succinate (TOPROL-XL) 25 MG 24 hr tablet Take 1 tablet by mouth daily.    . ondansetron (ZOFRAN) 4 MG tablet Take by mouth.    . oxybutynin (DITROPAN-XL) 5 MG 24 hr tablet Take 1 tablet by mouth at bedtime.    . polyethylene glycol (MIRALAX / GLYCOLAX) packet Take by mouth.    . bisoprolol-hydrochlorothiazide (ZIAC) 10-6.25 MG tablet TAKE 1 TABLET BY MOUTH EVERY DAY 90 tablet 1  . diclofenac sodium (VOLTAREN) 1 % GEL Apply 4 g topically 4 (four) times daily. To affected joint. 100 g 11  . lisinopril (PRINIVIL,ZESTRIL) 40 MG tablet TAKE 1 TABLET BY MOUTH EVERY DAY 90 tablet 1  . naproxen (NAPROSYN) 500 MG tablet Take 1 tablet (500 mg total) by mouth 2 (two) times daily as needed for moderate pain. 28 tablet 0   No facility-administered medications prior to visit.     No Known Allergies  ROS     Objective:    Physical Exam  Constitutional: She is oriented to person, place, and time. She appears well-developed and well-nourished.  HENT:  Head: Normocephalic and atraumatic.  Cardiovascular: Normal rate, regular rhythm and normal heart sounds.  Pulmonary/Chest: Effort normal and breath sounds normal.  Neurological: She is alert and oriented to person, place, and time.  Skin: Skin is warm and dry.  Psychiatric: She has a normal mood and affect. Her behavior is normal.    BP 106/72   Pulse (!) 120   Temp 98.5 F (36.9 C)   Ht 5\' 5"  (1.651 m)   Wt 164 lb (74.4 kg)   SpO2 100%   BMI 27.29 kg/m  Wt Readings from Last 3 Encounters:  03/09/19 164 lb (74.4 kg)  10/04/18 176 lb (79.8 kg)  09/06/18 177 lb (80.3 kg)    Health Maintenance Due  Topic Date Due  . MAMMOGRAM  03/04/2019    There are no preventive care reminders to display for this patient.   Lab Results  Component Value Date   TSH 4.24 05/20/2018   Lab Results   Component Value Date   WBC 5.2 02/23/2017   HGB 15.1 02/23/2017   HCT 45.1 (H) 02/23/2017   MCV 91.1 02/23/2017   PLT 200 02/23/2017   Lab Results  Component Value Date   NA 140 05/20/2018   K 4.3 05/20/2018   CO2 25 05/20/2018   GLUCOSE 92  05/20/2018   BUN 20 05/20/2018   CREATININE 0.87 05/20/2018   BILITOT 0.5 05/20/2018   ALKPHOS 81 02/19/2017   AST 23 05/20/2018   ALT 18 05/20/2018   PROT 7.7 05/20/2018   ALBUMIN 4.2 02/19/2017   CALCIUM 9.3 05/20/2018   Lab Results  Component Value Date   CHOL 203 (H) 05/20/2018   Lab Results  Component Value Date   HDL 40 (L) 05/20/2018   Lab Results  Component Value Date   LDLCALC 145 (H) 05/20/2018   Lab Results  Component Value Date   TRIG 79 05/20/2018   Lab Results  Component Value Date   CHOLHDL 5.1 (H) 05/20/2018   No results found for: HGBA1C     Assessment & Plan:   Problem List Items Addressed This Visit      Cardiovascular and Mediastinum   HYPERTENSION, BENIGN SYSTEMIC    Blood pressure was low today.  Can have her cut her amlodipine in half and take a half a tab daily through the weekend I want her to check her blood pressure at home she does have a cough and let me know on Monday through my chart what her blood pressures have been doing.  If they are still running a little low then we can discontinue the amlodipine.  That could be contributing to some of the weakness that she has been experiencing as well as lightheadedness.      HTN (hypertension)    Pressure looks good today in fact it actually is a little bit low.  Asked her to check her blood pressure over the weekend at home she does have a home blood pressure cuff.  We may need to actually cut her amlodipine in half.  She was given amlodipine in place of lisinopril because of acute kidney injury.        Musculoskeletal and Integument   Age-related osteoporosis with current pathological fracture with routine healing    She is not able to take  the calcium tabs so recommend Viactiv chews which also have the vitamin D included.  And they can also buy some over-the-counter chewable vitamin D to take extra.        Genitourinary   Endometrial adenocarcinoma ([place]) - Primary    She is actually supposed to start chemotherapy on the 25th and does have a follow-up appointment with oncology on Monday for blood work.  Recommendation is for her to get her vaccines updated.  Unfortunately we are out of flu vaccines but we can update her pneumonia vaccine.  At also recommended shingles but she would have to go to the pharmacy because it is covered under her Medicare part D.       Other Visit Diagnoses    Needs flu shot       Relevant Orders   Flu vaccine HIGH DOSE PF (Fluzone High dose) (Completed)   Need for pneumococcal vaccination       Relevant Orders   Pneumococcal polysaccharide vaccine 23-valent greater than or equal to 2yo subcutaneous/IM (Completed)   AKI (acute kidney injury) ([place])         Shingles vaccine will have to be done by the pharmacy as it is convered under your Medicare Part D.   AKI - she will be getting labs on Monday so I suspect they will likely be rechecking her renal function at that time.  No orders of the defined types were placed in this encounter.    [name], MD

## 2019-03-09 NOTE — Assessment & Plan Note (Signed)
She is actually supposed to start chemotherapy on the 25th and does have a follow-up appointment with oncology on Monday for blood work.  Recommendation is for her to get her vaccines updated.  Unfortunately we are out of flu vaccines but we can update her pneumonia vaccine.  At also recommended shingles but she would have to go to the pharmacy because it is covered under her Medicare part D.

## 2019-03-09 NOTE — Telephone Encounter (Signed)
Raquel Sarna with [facility] called.  She is needing a Verbal Order for occupational therapy for one time a week for 6 weeks.  Contact # [phone].  Thanks.

## 2019-03-09 NOTE — Assessment & Plan Note (Signed)
Pressure looks good today in fact it actually is a little bit low.  Asked her to check her blood pressure over the weekend at home she does have a home blood pressure cuff.  We may need to actually cut her amlodipine in half.  She was given amlodipine in place of lisinopril because of acute kidney injury.

## 2019-03-10 ENCOUNTER — Encounter: Payer: Self-pay | Admitting: Family Medicine

## 2019-03-10 DIAGNOSIS — N1330 Unspecified hydronephrosis: Secondary | ICD-10-CM | POA: Diagnosis not present

## 2019-03-10 DIAGNOSIS — I10 Essential (primary) hypertension: Secondary | ICD-10-CM | POA: Diagnosis not present

## 2019-03-10 DIAGNOSIS — C541 Malignant neoplasm of endometrium: Secondary | ICD-10-CM | POA: Diagnosis not present

## 2019-03-10 DIAGNOSIS — M8008XD Age-related osteoporosis with current pathological fracture, vertebra(e), subsequent encounter for fracture with routine healing: Secondary | ICD-10-CM | POA: Diagnosis not present

## 2019-03-10 DIAGNOSIS — Z960 Presence of urogenital implants: Secondary | ICD-10-CM | POA: Diagnosis not present

## 2019-03-10 NOTE — Telephone Encounter (Signed)
Called to give VO for OT for pt. Had to LVM giving permission for this. Advised to rtn call if any questions.Marland KitchenMarland KitchenElouise Munroe, [place]

## 2019-03-13 ENCOUNTER — Encounter: Payer: Self-pay | Admitting: Family Medicine

## 2019-03-14 DIAGNOSIS — C541 Malignant neoplasm of endometrium: Secondary | ICD-10-CM | POA: Diagnosis not present

## 2019-03-14 DIAGNOSIS — Z5111 Encounter for antineoplastic chemotherapy: Secondary | ICD-10-CM | POA: Diagnosis not present

## 2019-03-15 DIAGNOSIS — I10 Essential (primary) hypertension: Secondary | ICD-10-CM | POA: Diagnosis not present

## 2019-03-15 DIAGNOSIS — Z960 Presence of urogenital implants: Secondary | ICD-10-CM | POA: Diagnosis not present

## 2019-03-15 DIAGNOSIS — C541 Malignant neoplasm of endometrium: Secondary | ICD-10-CM | POA: Diagnosis not present

## 2019-03-15 DIAGNOSIS — M8008XD Age-related osteoporosis with current pathological fracture, vertebra(e), subsequent encounter for fracture with routine healing: Secondary | ICD-10-CM | POA: Diagnosis not present

## 2019-03-15 DIAGNOSIS — N1330 Unspecified hydronephrosis: Secondary | ICD-10-CM | POA: Diagnosis not present

## 2019-03-16 DIAGNOSIS — C541 Malignant neoplasm of endometrium: Secondary | ICD-10-CM | POA: Diagnosis not present

## 2019-03-16 DIAGNOSIS — I10 Essential (primary) hypertension: Secondary | ICD-10-CM | POA: Diagnosis not present

## 2019-03-16 DIAGNOSIS — N1330 Unspecified hydronephrosis: Secondary | ICD-10-CM | POA: Diagnosis not present

## 2019-03-16 DIAGNOSIS — M8008XD Age-related osteoporosis with current pathological fracture, vertebra(e), subsequent encounter for fracture with routine healing: Secondary | ICD-10-CM | POA: Diagnosis not present

## 2019-03-16 DIAGNOSIS — Z960 Presence of urogenital implants: Secondary | ICD-10-CM | POA: Diagnosis not present

## 2019-03-17 DIAGNOSIS — C541 Malignant neoplasm of endometrium: Secondary | ICD-10-CM | POA: Diagnosis not present

## 2019-03-17 DIAGNOSIS — Z5111 Encounter for antineoplastic chemotherapy: Secondary | ICD-10-CM | POA: Diagnosis not present

## 2019-03-19 DIAGNOSIS — Z960 Presence of urogenital implants: Secondary | ICD-10-CM | POA: Diagnosis not present

## 2019-03-19 DIAGNOSIS — I10 Essential (primary) hypertension: Secondary | ICD-10-CM | POA: Diagnosis not present

## 2019-03-19 DIAGNOSIS — M8008XD Age-related osteoporosis with current pathological fracture, vertebra(e), subsequent encounter for fracture with routine healing: Secondary | ICD-10-CM | POA: Diagnosis not present

## 2019-03-19 DIAGNOSIS — N1330 Unspecified hydronephrosis: Secondary | ICD-10-CM | POA: Diagnosis not present

## 2019-03-19 DIAGNOSIS — C541 Malignant neoplasm of endometrium: Secondary | ICD-10-CM | POA: Diagnosis not present

## 2019-03-24 ENCOUNTER — Other Ambulatory Visit: Payer: Self-pay | Admitting: *Deleted

## 2019-03-24 DIAGNOSIS — N3289 Other specified disorders of bladder: Secondary | ICD-10-CM

## 2019-03-24 DIAGNOSIS — I10 Essential (primary) hypertension: Secondary | ICD-10-CM

## 2019-03-24 DIAGNOSIS — I272 Pulmonary hypertension, unspecified: Secondary | ICD-10-CM

## 2019-03-24 MED ORDER — OXYBUTYNIN CHLORIDE ER 5 MG PO TB24: 5.0000 mg | ORAL_TABLET | Freq: Every day | ORAL | 3 refills | Status: DC

## 2019-03-24 MED ORDER — METOPROLOL SUCCINATE ER 25 MG PO TB24: 25.0000 mg | ORAL_TABLET | Freq: Every day | ORAL | 1 refills | Status: DC

## 2019-03-24 MED ORDER — AMLODIPINE BESYLATE 5 MG PO TABS: 5.0000 mg | ORAL_TABLET | Freq: Every day | ORAL | 1 refills | Status: DC

## 2019-03-26 DIAGNOSIS — M8008XD Age-related osteoporosis with current pathological fracture, vertebra(e), subsequent encounter for fracture with routine healing: Secondary | ICD-10-CM | POA: Diagnosis not present

## 2019-03-26 DIAGNOSIS — C541 Malignant neoplasm of endometrium: Secondary | ICD-10-CM | POA: Diagnosis not present

## 2019-03-26 DIAGNOSIS — I10 Essential (primary) hypertension: Secondary | ICD-10-CM | POA: Diagnosis not present

## 2019-03-26 DIAGNOSIS — Z960 Presence of urogenital implants: Secondary | ICD-10-CM | POA: Diagnosis not present

## 2019-03-26 DIAGNOSIS — N1330 Unspecified hydronephrosis: Secondary | ICD-10-CM | POA: Diagnosis not present

## 2019-03-28 DIAGNOSIS — I10 Essential (primary) hypertension: Secondary | ICD-10-CM | POA: Diagnosis not present

## 2019-03-28 DIAGNOSIS — M8008XD Age-related osteoporosis with current pathological fracture, vertebra(e), subsequent encounter for fracture with routine healing: Secondary | ICD-10-CM | POA: Diagnosis not present

## 2019-03-28 DIAGNOSIS — Z960 Presence of urogenital implants: Secondary | ICD-10-CM | POA: Diagnosis not present

## 2019-03-28 DIAGNOSIS — N1330 Unspecified hydronephrosis: Secondary | ICD-10-CM | POA: Diagnosis not present

## 2019-03-28 DIAGNOSIS — C541 Malignant neoplasm of endometrium: Secondary | ICD-10-CM | POA: Diagnosis not present

## 2019-03-29 DIAGNOSIS — C541 Malignant neoplasm of endometrium: Secondary | ICD-10-CM | POA: Diagnosis not present

## 2019-03-29 DIAGNOSIS — M8008XD Age-related osteoporosis with current pathological fracture, vertebra(e), subsequent encounter for fracture with routine healing: Secondary | ICD-10-CM | POA: Diagnosis not present

## 2019-03-29 DIAGNOSIS — N1330 Unspecified hydronephrosis: Secondary | ICD-10-CM | POA: Diagnosis not present

## 2019-03-29 DIAGNOSIS — Z960 Presence of urogenital implants: Secondary | ICD-10-CM | POA: Diagnosis not present

## 2019-03-29 DIAGNOSIS — I10 Essential (primary) hypertension: Secondary | ICD-10-CM | POA: Diagnosis not present

## 2019-04-05 DIAGNOSIS — C541 Malignant neoplasm of endometrium: Secondary | ICD-10-CM | POA: Diagnosis not present

## 2019-04-05 DIAGNOSIS — R5383 Other fatigue: Secondary | ICD-10-CM | POA: Diagnosis not present

## 2019-04-05 DIAGNOSIS — T451X5A Adverse effect of antineoplastic and immunosuppressive drugs, initial encounter: Secondary | ICD-10-CM | POA: Diagnosis not present

## 2019-04-05 DIAGNOSIS — Z5111 Encounter for antineoplastic chemotherapy: Secondary | ICD-10-CM | POA: Diagnosis not present

## 2019-04-12 DIAGNOSIS — Z5111 Encounter for antineoplastic chemotherapy: Secondary | ICD-10-CM | POA: Diagnosis not present

## 2019-04-12 DIAGNOSIS — R5383 Other fatigue: Secondary | ICD-10-CM | POA: Diagnosis not present

## 2019-04-12 DIAGNOSIS — T451X5A Adverse effect of antineoplastic and immunosuppressive drugs, initial encounter: Secondary | ICD-10-CM | POA: Diagnosis not present

## 2019-04-12 DIAGNOSIS — C541 Malignant neoplasm of endometrium: Secondary | ICD-10-CM | POA: Diagnosis not present

## 2019-04-24 ENCOUNTER — Ambulatory Visit (INDEPENDENT_AMBULATORY_CARE_PROVIDER_SITE_OTHER): Payer: Medicare HMO | Admitting: Family Medicine

## 2019-04-24 ENCOUNTER — Encounter: Payer: Self-pay | Admitting: Family Medicine

## 2019-04-24 VITALS — BP 130/80

## 2019-04-24 DIAGNOSIS — F418 Other specified anxiety disorders: Secondary | ICD-10-CM

## 2019-04-24 DIAGNOSIS — F5104 Psychophysiologic insomnia: Secondary | ICD-10-CM | POA: Diagnosis not present

## 2019-04-24 DIAGNOSIS — I10 Essential (primary) hypertension: Secondary | ICD-10-CM | POA: Diagnosis not present

## 2019-04-24 DIAGNOSIS — C541 Malignant neoplasm of endometrium: Secondary | ICD-10-CM

## 2019-04-24 MED ORDER — ZOLPIDEM TARTRATE 5 MG PO TABS: 5.0000 mg | ORAL_TABLET | Freq: Every evening | ORAL | 1 refills | Status: DC | PRN

## 2019-04-24 MED ORDER — SERTRALINE HCL 50 MG PO TABS: ORAL_TABLET | ORAL | 1 refills | Status: DC

## 2019-04-24 NOTE — Progress Notes (Signed)
Virtual Visit via Video Note  I connected with Amy Wilson on 04/24/19 at  2:20 PM EDT by a video enabled telemedicine application and verified that I am speaking with the correct person using two identifiers.   I discussed the limitations of evaluation and management by telemedicine and the availability of in person appointments. The patient expressed understanding and agreed to proceed.  Pt was at home and I was in my office for the virtual visit.     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  Last treatment was April 22.  She has a lot of anxiety with what she is going thru, trouble sleeping and little to no appetite. She feels tired all the time. Not sleeping well. She is just tossing and turning cannot get her mind to settle down.. She does not feel Lexapro is really helping.  She is felt tired with her chemo treatments and this is made it rough as well.  Her daughter and son-in-law live with her.  Follow-up hypertension-when I saw her in March her blood pressure was actually running a little bit low.  I had her cut her amlodipine in half.  Her blood pressures look fantastic and she contacted me through my chart.  Pressures were running between 105 and 122 after actually holding her medication completely so I encouraged her to stop the amlodipine and follow it over the next month or 2.  She actually also stopped her metoprolol.  Though she has noticed that when she gets up and she tries to be active her heart will race a little bit. Yesterday 119/72  Past medical history, Surgical history, Family history not pertinant except as noted below, Social history, Allergies, and medications have been entered into the medical record, reviewed, and corrections made.   Review of Systems: No fevers, chills, night sweats, weight loss, chest pain, or  shortness of breath.   Objective:    General: Speaking clearly in complete sentences without any shortness of breath.  Alert and oriented x3.  Normal judgment. No apparent acute distress.  Well-groomed.    Impression and Recommendations:   Endometrial adenocarcinoma-undergoing chemotherapy treatments with oncology.  Last treatment was about 2 weeks ago.  She just feels extremely tired and short of breath at times.  Depression/anxiety-we discussed options.  She just feels like the Lexapro is not really working so we will discontinue Lexapro.  She will drop down to half a tab for 6 days and then stop the medication.  Will start sertraline instead.  Right now she feels like her anxiety is what is worse compared to the depression symptoms.  We will start with half a tab daily and then increase to a whole tab after 1 week.  I like to follow-up with her in about 3 weeks to make sure that she is doing well.  She did agree to referral to a therapist to do virtual visits.  When ahead and place that today as well.  Hypertension- BP looks good. Recommend 1/2 tab of the metoprolol for palpitations with activity.  We may also consider restarting a very low dose of the amlodipine for her pulmonary hypertension.  I am just worried about her blood pressure dropping.  She has syncopal events quite easily and has had multiple in the past.  Just we will make sure were not overmedicating her.  Insomnia-discussed options.  We will start with a low-dose of Ambien.  Start 2.5 mg Warned about potential of excess sedation as well as sleepwalking.  She is to monitor for any other symptoms.     I discussed the assessment and treatment plan with the patient. The patient was provided an opportunity to ask questions and all were answered. The patient agreed with the plan and demonstrated an understanding of the instructions.   The patient was advised to call back or seek an in-person evaluation if the symptoms worsen or if  the condition fails to improve as anticipated.   Amy Lecher, MD

## 2019-04-24 NOTE — Progress Notes (Signed)
She has a lot of anxiety with what she is going thru, trouble sleeping and little to no appetite.Marland KitchenMarland KitchenElouise Wilson, [place]

## 2019-05-03 DIAGNOSIS — M810 Age-related osteoporosis without current pathological fracture: Secondary | ICD-10-CM | POA: Diagnosis not present

## 2019-05-03 DIAGNOSIS — C786 Secondary malignant neoplasm of retroperitoneum and peritoneum: Secondary | ICD-10-CM | POA: Diagnosis not present

## 2019-05-03 DIAGNOSIS — C772 Secondary and unspecified malignant neoplasm of intra-abdominal lymph nodes: Secondary | ICD-10-CM | POA: Diagnosis not present

## 2019-05-03 DIAGNOSIS — C770 Secondary and unspecified malignant neoplasm of lymph nodes of head, face and neck: Secondary | ICD-10-CM | POA: Diagnosis not present

## 2019-05-03 DIAGNOSIS — C775 Secondary and unspecified malignant neoplasm of intrapelvic lymph nodes: Secondary | ICD-10-CM | POA: Insufficient documentation

## 2019-05-03 DIAGNOSIS — Z5111 Encounter for antineoplastic chemotherapy: Secondary | ICD-10-CM | POA: Diagnosis not present

## 2019-05-03 DIAGNOSIS — C541 Malignant neoplasm of endometrium: Secondary | ICD-10-CM | POA: Diagnosis not present

## 2019-05-03 DIAGNOSIS — I10 Essential (primary) hypertension: Secondary | ICD-10-CM | POA: Diagnosis not present

## 2019-05-03 DIAGNOSIS — I272 Pulmonary hypertension, unspecified: Secondary | ICD-10-CM | POA: Diagnosis not present

## 2019-05-03 DIAGNOSIS — Z79899 Other long term (current) drug therapy: Secondary | ICD-10-CM | POA: Diagnosis not present

## 2019-05-03 DIAGNOSIS — C7989 Secondary malignant neoplasm of other specified sites: Secondary | ICD-10-CM | POA: Diagnosis not present

## 2019-05-11 DIAGNOSIS — N1330 Unspecified hydronephrosis: Secondary | ICD-10-CM | POA: Diagnosis not present

## 2019-05-16 DIAGNOSIS — I2510 Atherosclerotic heart disease of native coronary artery without angina pectoris: Secondary | ICD-10-CM | POA: Diagnosis not present

## 2019-05-16 DIAGNOSIS — C541 Malignant neoplasm of endometrium: Secondary | ICD-10-CM | POA: Diagnosis not present

## 2019-05-16 DIAGNOSIS — N1330 Unspecified hydronephrosis: Secondary | ICD-10-CM | POA: Diagnosis not present

## 2019-05-16 DIAGNOSIS — K8020 Calculus of gallbladder without cholecystitis without obstruction: Secondary | ICD-10-CM | POA: Diagnosis not present

## 2019-05-18 DIAGNOSIS — N1330 Unspecified hydronephrosis: Secondary | ICD-10-CM | POA: Insufficient documentation

## 2019-05-24 DIAGNOSIS — C775 Secondary and unspecified malignant neoplasm of intrapelvic lymph nodes: Secondary | ICD-10-CM | POA: Diagnosis not present

## 2019-05-24 DIAGNOSIS — Z5111 Encounter for antineoplastic chemotherapy: Secondary | ICD-10-CM | POA: Diagnosis not present

## 2019-05-24 DIAGNOSIS — C770 Secondary and unspecified malignant neoplasm of lymph nodes of head, face and neck: Secondary | ICD-10-CM | POA: Diagnosis not present

## 2019-05-24 DIAGNOSIS — C772 Secondary and unspecified malignant neoplasm of intra-abdominal lymph nodes: Secondary | ICD-10-CM | POA: Diagnosis not present

## 2019-05-24 DIAGNOSIS — C541 Malignant neoplasm of endometrium: Secondary | ICD-10-CM | POA: Diagnosis not present

## 2019-06-09 DIAGNOSIS — N1330 Unspecified hydronephrosis: Secondary | ICD-10-CM | POA: Diagnosis not present

## 2019-06-13 DIAGNOSIS — Z1159 Encounter for screening for other viral diseases: Secondary | ICD-10-CM | POA: Diagnosis not present

## 2019-06-13 DIAGNOSIS — Z01818 Encounter for other preprocedural examination: Secondary | ICD-10-CM | POA: Diagnosis not present

## 2019-06-13 DIAGNOSIS — N1330 Unspecified hydronephrosis: Secondary | ICD-10-CM | POA: Diagnosis not present

## 2019-06-16 DIAGNOSIS — C775 Secondary and unspecified malignant neoplasm of intrapelvic lymph nodes: Secondary | ICD-10-CM | POA: Diagnosis not present

## 2019-06-16 DIAGNOSIS — C786 Secondary malignant neoplasm of retroperitoneum and peritoneum: Secondary | ICD-10-CM | POA: Diagnosis not present

## 2019-06-16 DIAGNOSIS — C770 Secondary and unspecified malignant neoplasm of lymph nodes of head, face and neck: Secondary | ICD-10-CM | POA: Diagnosis not present

## 2019-06-16 DIAGNOSIS — Z466 Encounter for fitting and adjustment of urinary device: Secondary | ICD-10-CM | POA: Diagnosis not present

## 2019-06-16 DIAGNOSIS — C541 Malignant neoplasm of endometrium: Secondary | ICD-10-CM | POA: Diagnosis not present

## 2019-06-16 DIAGNOSIS — Z79899 Other long term (current) drug therapy: Secondary | ICD-10-CM | POA: Diagnosis not present

## 2019-06-16 DIAGNOSIS — I10 Essential (primary) hypertension: Secondary | ICD-10-CM | POA: Diagnosis not present

## 2019-06-16 DIAGNOSIS — N1330 Unspecified hydronephrosis: Secondary | ICD-10-CM | POA: Diagnosis not present

## 2019-06-16 DIAGNOSIS — Z960 Presence of urogenital implants: Secondary | ICD-10-CM | POA: Diagnosis not present

## 2019-06-16 DIAGNOSIS — I700 Atherosclerosis of aorta: Secondary | ICD-10-CM | POA: Diagnosis not present

## 2019-07-11 ENCOUNTER — Other Ambulatory Visit: Payer: Self-pay | Admitting: Family Medicine

## 2019-07-11 DIAGNOSIS — Z01812 Encounter for preprocedural laboratory examination: Secondary | ICD-10-CM | POA: Diagnosis not present

## 2019-07-11 DIAGNOSIS — Z1159 Encounter for screening for other viral diseases: Secondary | ICD-10-CM | POA: Diagnosis not present

## 2019-07-11 DIAGNOSIS — C541 Malignant neoplasm of endometrium: Secondary | ICD-10-CM | POA: Diagnosis not present

## 2019-07-11 DIAGNOSIS — K449 Diaphragmatic hernia without obstruction or gangrene: Secondary | ICD-10-CM | POA: Diagnosis not present

## 2019-07-11 DIAGNOSIS — I10 Essential (primary) hypertension: Secondary | ICD-10-CM | POA: Diagnosis not present

## 2019-07-14 DIAGNOSIS — C541 Malignant neoplasm of endometrium: Secondary | ICD-10-CM | POA: Diagnosis not present

## 2019-07-14 DIAGNOSIS — K449 Diaphragmatic hernia without obstruction or gangrene: Secondary | ICD-10-CM | POA: Diagnosis not present

## 2019-07-14 DIAGNOSIS — C775 Secondary and unspecified malignant neoplasm of intrapelvic lymph nodes: Secondary | ICD-10-CM | POA: Diagnosis not present

## 2019-07-14 DIAGNOSIS — I272 Pulmonary hypertension, unspecified: Secondary | ICD-10-CM | POA: Diagnosis not present

## 2019-07-14 DIAGNOSIS — D251 Intramural leiomyoma of uterus: Secondary | ICD-10-CM | POA: Diagnosis not present

## 2019-07-14 DIAGNOSIS — C770 Secondary and unspecified malignant neoplasm of lymph nodes of head, face and neck: Secondary | ICD-10-CM | POA: Diagnosis not present

## 2019-07-14 DIAGNOSIS — K219 Gastro-esophageal reflux disease without esophagitis: Secondary | ICD-10-CM | POA: Diagnosis not present

## 2019-07-14 DIAGNOSIS — M8000XD Age-related osteoporosis with current pathological fracture, unspecified site, subsequent encounter for fracture with routine healing: Secondary | ICD-10-CM | POA: Diagnosis not present

## 2019-07-14 DIAGNOSIS — N840 Polyp of corpus uteri: Secondary | ICD-10-CM | POA: Diagnosis not present

## 2019-07-15 DIAGNOSIS — C775 Secondary and unspecified malignant neoplasm of intrapelvic lymph nodes: Secondary | ICD-10-CM | POA: Diagnosis not present

## 2019-07-15 DIAGNOSIS — K219 Gastro-esophageal reflux disease without esophagitis: Secondary | ICD-10-CM | POA: Diagnosis not present

## 2019-07-15 DIAGNOSIS — I272 Pulmonary hypertension, unspecified: Secondary | ICD-10-CM | POA: Diagnosis not present

## 2019-07-15 DIAGNOSIS — K449 Diaphragmatic hernia without obstruction or gangrene: Secondary | ICD-10-CM | POA: Diagnosis not present

## 2019-07-15 DIAGNOSIS — C770 Secondary and unspecified malignant neoplasm of lymph nodes of head, face and neck: Secondary | ICD-10-CM | POA: Diagnosis not present

## 2019-07-15 DIAGNOSIS — M8000XD Age-related osteoporosis with current pathological fracture, unspecified site, subsequent encounter for fracture with routine healing: Secondary | ICD-10-CM | POA: Diagnosis not present

## 2019-07-15 DIAGNOSIS — C541 Malignant neoplasm of endometrium: Secondary | ICD-10-CM | POA: Diagnosis not present

## 2019-07-26 DIAGNOSIS — Z466 Encounter for fitting and adjustment of urinary device: Secondary | ICD-10-CM | POA: Diagnosis not present

## 2019-07-26 DIAGNOSIS — I10 Essential (primary) hypertension: Secondary | ICD-10-CM | POA: Diagnosis not present

## 2019-07-26 DIAGNOSIS — R918 Other nonspecific abnormal finding of lung field: Secondary | ICD-10-CM | POA: Diagnosis not present

## 2019-07-26 DIAGNOSIS — R000 Tachycardia, unspecified: Secondary | ICD-10-CM | POA: Diagnosis not present

## 2019-07-26 DIAGNOSIS — R300 Dysuria: Secondary | ICD-10-CM | POA: Diagnosis not present

## 2019-07-26 DIAGNOSIS — R5383 Other fatigue: Secondary | ICD-10-CM | POA: Diagnosis not present

## 2019-07-26 DIAGNOSIS — R509 Fever, unspecified: Secondary | ICD-10-CM | POA: Diagnosis not present

## 2019-07-26 DIAGNOSIS — Z8542 Personal history of malignant neoplasm of other parts of uterus: Secondary | ICD-10-CM | POA: Diagnosis not present

## 2019-07-26 DIAGNOSIS — M549 Dorsalgia, unspecified: Secondary | ICD-10-CM | POA: Diagnosis not present

## 2019-07-26 DIAGNOSIS — Z9889 Other specified postprocedural states: Secondary | ICD-10-CM | POA: Diagnosis not present

## 2019-07-26 DIAGNOSIS — N390 Urinary tract infection, site not specified: Secondary | ICD-10-CM | POA: Diagnosis not present

## 2019-07-26 DIAGNOSIS — N12 Tubulo-interstitial nephritis, not specified as acute or chronic: Secondary | ICD-10-CM | POA: Diagnosis not present

## 2019-07-26 DIAGNOSIS — N1330 Unspecified hydronephrosis: Secondary | ICD-10-CM | POA: Diagnosis not present

## 2019-07-26 DIAGNOSIS — J9811 Atelectasis: Secondary | ICD-10-CM | POA: Diagnosis not present

## 2019-07-26 DIAGNOSIS — J984 Other disorders of lung: Secondary | ICD-10-CM | POA: Diagnosis not present

## 2019-07-27 ENCOUNTER — Telehealth: Payer: Self-pay | Admitting: Family Medicine

## 2019-07-27 NOTE — Telephone Encounter (Addendum)
Larene Beach calling in stating that patient needs a hospital follow up. Was seen at [facility] hospital for a day for UTI and a mild case of pneumonia. Only wants to see Dr.Metheney in the morning next week when Dr.Metheney comes back. As soon as possible. I did offer appointment with other providers. Nothing available with PCP next week.  They declined. Please contact and advise.

## 2019-07-30 NOTE — Telephone Encounter (Signed)
I know it would be early be we could do 7: 40 on Wed Morning or Friday morning at 8:10 AM

## 2019-07-31 NOTE — Telephone Encounter (Signed)
Appointment has been made for this Friday. No further questions at this time.

## 2019-08-02 DIAGNOSIS — C770 Secondary and unspecified malignant neoplasm of lymph nodes of head, face and neck: Secondary | ICD-10-CM | POA: Diagnosis not present

## 2019-08-02 DIAGNOSIS — C541 Malignant neoplasm of endometrium: Secondary | ICD-10-CM | POA: Diagnosis not present

## 2019-08-02 DIAGNOSIS — Z9889 Other specified postprocedural states: Secondary | ICD-10-CM | POA: Diagnosis not present

## 2019-08-02 DIAGNOSIS — C772 Secondary and unspecified malignant neoplasm of intra-abdominal lymph nodes: Secondary | ICD-10-CM | POA: Diagnosis not present

## 2019-08-02 DIAGNOSIS — C775 Secondary and unspecified malignant neoplasm of intrapelvic lymph nodes: Secondary | ICD-10-CM | POA: Diagnosis not present

## 2019-08-02 NOTE — Progress Notes (Signed)
Subjective:   Amy Wilson is a 68 y.o. female who presents for an Initial Medicare Annual Wellness Visit.  Review of Systems    No ROS.  Medicare Wellness Virtual Visit.  Visual/audio telehealth visit, UTA vital signs.   See social history for additional risk factors.     Cardiac Risk Factors include: advanced age (>55men, >9 women);hypertension;sedentary lifestyle Sleep patterns: Getting 5-6 hours of sleep a night. Wakes up 2 times to void at night. Wakes up feeling refreshed. Home Safety/Smoke Alarms: Feels safe in home. Smoke alarms in place.  Living environment; Lives with daughter and son-in-law in a 1 story home. No steps in the home. Shower is a walk in shower with built in bench. Seat Belt Safety/Bike Helmet: Wears seat belt.   Female:   Pap- Aged out     Mammo-  UTD     Dexa scan-   Wait until done with chemo     CCS-wait until done with chemo    Objective:    Today's Vitals   08/07/19 0921  BP: (!) 142/95  Pulse: 88  Weight: 170 lb (77.1 kg)  Height: 5\' 5"  (1.651 m)   Body mass index is 28.29 kg/m.  Advanced Directives 08/07/2019  Does Patient Have a Medical Advance Directive? No;Yes  Type of Advance Directive [place]  Does patient want to make changes to medical advance directive? No - Patient declined  Copy of [place] in Chart? No - copy requested  Would patient like information on creating a medical advance directive? No - Patient declined    Current Medications (verified) Outpatient Encounter Medications as of 08/07/2019  Medication Sig  . calcium carbonate (OS-CAL - DOSED IN MG OF ELEMENTAL CALCIUM) 1250 (500 Ca) MG tablet Take 1 tablet by mouth daily.  . cholecalciferol (VITAMIN D) 25 MCG (1000 UT) tablet Take 1 tablet by mouth daily.  Marland Kitchen enoxaparin (LOVENOX) 40 MG/0.4ML injection Inject 40 mg into the skin daily. injecy 0.81mls for 28 days  . loratadine (CLARITIN) 10 MG tablet Take 10 mg by mouth daily.   . ondansetron (ZOFRAN) 8 MG tablet Take 1 tablet by mouth every 8 (eight) hours as needed for nausea.  . tamsulosin (FLOMAX) 0.4 MG CAPS capsule Take 0.4 mg by mouth at bedtime.  . metoprolol succinate (TOPROL-XL) 25 MG 24 hr tablet Take 1 tablet (25 mg total) by mouth daily. (Patient not taking: Reported on 08/07/2019)  . polyethylene glycol (MIRALAX / GLYCOLAX) packet Take by mouth.  . sertraline (ZOLOFT) 50 MG tablet 1/2 tab po QD x 6 days then increase to whole tab QD. (Patient not taking: Reported on 08/07/2019)  . zolpidem (AMBIEN) 5 MG tablet Take 1 tablet (5 mg total) by mouth at bedtime as needed for sleep. (Patient not taking: Reported on 08/07/2019)  . [DISCONTINUED] amLODipine (NORVASC) 5 MG tablet Take 1 tablet (5 mg total) by mouth daily.   No facility-administered encounter medications on file as of 08/07/2019.     Allergies (verified) Patient has no known allergies.   History: Past Medical History:  Diagnosis Date  . Cancer [facility])    uterine   Past Surgical History:  Procedure Laterality Date  . ABDOMINAL HYSTERECTOMY    . kidney stents    . KNEE ARTHROSCOPY Right    Family History  Problem Relation Age of Onset  . Heart disease Father    Social History   Socioeconomic History  . Marital status: Divorced  Spouse name: Not on file  . Number of children: 2  . Years of education: 44  . Highest education level: 12th grade  Occupational History  . Occupation: retail    Comment: retired  Scientific laboratory technician  . Financial resource strain: Not hard at all  . Food insecurity    Worry: Never true    Inability: Never true  . Transportation needs    Medical: No    Non-medical: No  Tobacco Use  . Smoking status: Never Smoker  . Smokeless tobacco: Never Used  Substance and Sexual Activity  . Alcohol use: Never    Frequency: Never  . Drug use: No  . Sexual activity: Never  Lifestyle  . Physical activity    Days per week: 7 days    Minutes per session: 10 min  .  Stress: Not at all  Relationships  . Social Herbalist on phone: More than three times a week    Gets together: Never    Attends religious service: Never    Active member of club or organization: No    Attends meetings of clubs or organizations: Never    Relationship status: Divorced  Other Topics Concern  . Not on file  Social History Narrative   Patient has cancer and doing chemo and radiation. Stays tired all the time    Tobacco Counseling Counseling given: Not Answered   Clinical Intake:  Pre-visit preparation completed: Yes  Pain : No/denies pain     Nutritional Risks: None Diabetes: No  How often do you need to have someone help you when you read instructions, pamphlets, or other written materials from your doctor or pharmacy?: 1 - Never What is the last grade level you completed in school?: 12  Interpreter Needed?: No  Information entered by :: Orlie Dakin, LPN   Activities of Daily Living In your present state of health, do you have any difficulty performing the following activities: 08/07/2019  Hearing? N  Vision? N  Difficulty concentrating or making decisions? N  Walking or climbing stairs? N  Dressing or bathing? N  Doing errands, shopping? Y  Comment daughter takes her right now  Conservation officer, nature and eating ? N  Using the Toilet? N  In the past six months, have you accidently leaked urine? Y  Comment wears depends  Do you have problems with loss of bowel control? N  Managing your Medications? N  Managing your Finances? N  Housekeeping or managing your Housekeeping? N  Some recent data might be hidden     Immunizations and Health Maintenance Immunization History  Administered Date(s) Administered  . Influenza, High Dose Seasonal PF 03/09/2019  . Pneumococcal Polysaccharide-23 03/09/2019   Health Maintenance Due  Topic Date Due  . MAMMOGRAM  03/04/2019  . INFLUENZA VACCINE  07/22/2019    Patient Care Team: Hali Marry,  MD as PCP - General  Indicate any recent Medical Services you may have received from other than [facility] providers in the past year (date may be approximate).     Assessment:   This is a routine wellness examination for Takisha.Physical assessment deferred to PCP.   Hearing/Vision screen No exam data present  Dietary issues and exercise activities discussed: Current Exercise Habits: Home exercise routine, Type of exercise: walking, Time (Minutes): 10, Frequency (Times/Week): 7, Weekly Exercise (Minutes/Week): 70, Intensity: Mild, Exercise limited by: Other - see comments(cancer) Diet Eats a healthy diet of fruits and vegetables and protein  Breakfast: Eggs and toast.  fruit Lunch: sandwich Dinner:  Meat and vegetables, chicken grilled      Goals    . Patient Stated     Patient stated would like to feel better and not be so tired from the chemo. Exercise more and not get so tired      Depression Screen PHQ 2/9 Scores 08/07/2019 04/24/2019 10/04/2018 09/06/2018 05/13/2018 06/19/2016  PHQ - 2 Score 2 5 2 6  0 0  PHQ- 9 Score 6 21 6 20  - -    Fall Risk Fall Risk  08/07/2019 08/04/2019 09/06/2018 05/13/2018 05/12/2018  Falls in the past year? 1 0 No No No  Number falls in past yr: 0 0 - - -  Injury with Fall? 1 1 - - -  Comment fracture in lower spine (compression fracture) - - - -  Risk for fall due to : Medication side effect History of fall(s) - - -  Follow up Falls prevention discussed Education provided - - -    Is the patient's home free of loose throw rugs in walkways, pet beds, electrical cords, etc?   yes      Grab bars in the bathroom? yes      Handrails on the stairs?   no      Adequate lighting?   yes   Cognitive Function:        Screening Tests Health Maintenance  Topic Date Due  . MAMMOGRAM  03/04/2019  . INFLUENZA VACCINE  07/22/2019  . DEXA SCAN  09/07/2019 (Originally 05/26/2014)  . COLONOSCOPY  09/07/2019 (Originally 05/27/1999)  . TETANUS/TDAP  09/07/2019  (Originally 05/26/1968)  . PNA vac Low Risk Adult (2 of 2 - PCV13) 03/08/2020  . Hepatitis C Screening  Completed       Plan:    Ms. Kollman , Thank you for taking time to come for your Medicare Wellness Visit. I appreciate your ongoing commitment to your health goals. Please review the following plan we discussed and let me know if I can assist you in the future. Please schedule your next medicare wellness visit with me in 1 yr. Continue doing brain stimulating activities (puzzles, reading, adult coloring books, staying active) to keep memory sharp.  Bring a copy of your living will and/or healthcare power of attorney to your next office visit.    These are the goals we discussed: Goals    . Patient Stated     Patient stated would like to feel better and not be so tired from the chemo. Exercise more and not get so tired       This is a list of the screening recommended for you and due dates:  Health Maintenance  Topic Date Due  . Mammogram  03/04/2019  . Flu Shot  07/22/2019  . DEXA scan (bone density measurement)  09/07/2019*  . Colon Cancer Screening  09/07/2019*  . Tetanus Vaccine  09/07/2019*  . Pneumonia vaccines (2 of 2 - PCV13) 03/08/2020  .  Hepatitis C: One time screening is recommended by Center for Disease Control  (CDC) for  adults born from [birth year] through 1967.   Completed  *Topic was postponed. The date shown is not the original due date.     I have personally reviewed and noted the following in the patient's chart:   . Medical and social history . Use of alcohol, tobacco or illicit drugs  . Current medications and supplements . Functional ability and status . Nutritional status . Physical activity . Advanced directives . List  of other physicians . Hospitalizations, surgeries, and ER visits in previous 12 months . Vitals . Screenings to include cognitive, depression, and falls . Referrals and appointments  In addition, I have reviewed and discussed with  patient certain preventive protocols, quality metrics, and best practice recommendations. A written personalized care plan for preventive services as well as general preventive health recommendations were provided to patient.     Joanne Chars, LPN   06/10/3085

## 2019-08-04 ENCOUNTER — Ambulatory Visit (INDEPENDENT_AMBULATORY_CARE_PROVIDER_SITE_OTHER): Payer: Medicare HMO | Admitting: Family Medicine

## 2019-08-04 ENCOUNTER — Other Ambulatory Visit: Payer: Self-pay

## 2019-08-04 ENCOUNTER — Encounter: Payer: Self-pay | Admitting: Family Medicine

## 2019-08-04 VITALS — BP 168/81 | HR 96 | Temp 97.9°F | Ht 65.0 in | Wt 170.0 lb

## 2019-08-04 DIAGNOSIS — N12 Tubulo-interstitial nephritis, not specified as acute or chronic: Secondary | ICD-10-CM

## 2019-08-04 DIAGNOSIS — C541 Malignant neoplasm of endometrium: Secondary | ICD-10-CM

## 2019-08-04 DIAGNOSIS — I10 Essential (primary) hypertension: Secondary | ICD-10-CM | POA: Diagnosis not present

## 2019-08-04 NOTE — Assessment & Plan Note (Signed)
Her next chemotherapy appointment is in about 12 days.  She has been spaced about 3 weeks apart and still has 3 more treatments to do.  I would like to give her her flu vaccine but I think we need to time it correctly so that she will have the best response possible.  Encouraged her to speak to her oncologist about timing of the vaccine.

## 2019-08-04 NOTE — Progress Notes (Signed)
Established Patient Office Visit  Subjective:  Patient ID: Amy Wilson, female    DOB: 11/13/1951  Age: 68 y.o. MRN: [id number]  CC:  Chief Complaint  Patient presents with  . Follow-up    hospital    HPI Amy Wilson presents for hospital follow up.  Seen in the ED at [facility] on 8/5 for Pylonephritis after renal stent placement, She presented initially with fever and had complained of some mid back pain without any injury or trauma.  She had had a low-grade temperature of 100.2 as well as some dysuria.  Known history of endometrial cancer status post hysterectomy on July 24 and bilateral renal stent placement on June 16, 2019. CT showed mild right hydronephosis and perinephri induration.  She was placed on Kelfex. They had encouraged hospitalization but she wanted to go home.   History reviewed. No pertinent past medical history.  Past Surgical History:  Procedure Laterality Date  . KNEE ARTHROSCOPY Right     History reviewed. No pertinent family history.  Social History   Socioeconomic History  . Marital status: Divorced    Spouse name: Not on file  . Number of children: Not on file  . Years of education: Not on file  . Highest education level: Not on file  Occupational History  . Not on file  Social Needs  . Financial resource strain: Not on file  . Food insecurity    Worry: Not on file    Inability: Not on file  . Transportation needs    Medical: Not on file    Non-medical: Not on file  Tobacco Use  . Smoking status: Never Smoker  . Smokeless tobacco: Never Used  Substance and Sexual Activity  . Alcohol use: Not on file  . Drug use: No  . Sexual activity: Never  Lifestyle  . Physical activity    Days per week: Not on file    Minutes per session: Not on file  . Stress: Not on file  Relationships  . Social Herbalist on phone: Not on file    Gets together: Not on file    Attends religious service: Not on file    Active member of club or  organization: Not on file    Attends meetings of clubs or organizations: Not on file    Relationship status: Not on file  . Intimate partner violence    Fear of current or ex partner: Not on file    Emotionally abused: Not on file    Physically abused: Not on file    Forced sexual activity: Not on file  Other Topics Concern  . Not on file  Social History Narrative  . Not on file    Outpatient Medications Prior to Visit  Medication Sig Dispense Refill  . calcium carbonate (OS-CAL - DOSED IN MG OF ELEMENTAL CALCIUM) 1250 (500 Ca) MG tablet Take 1 tablet by mouth daily.    . cholecalciferol (VITAMIN D) 25 MCG (1000 UT) tablet Take 1 tablet by mouth daily.    Marland Kitchen enoxaparin (LOVENOX) 40 MG/0.4ML injection Inject 40 mg into the skin daily. injecy 0.60mls for 28 days    . loratadine (CLARITIN) 10 MG tablet Take 10 mg by mouth daily.    . metoprolol succinate (TOPROL-XL) 25 MG 24 hr tablet Take 1 tablet (25 mg total) by mouth daily. 90 tablet 1  . ondansetron (ZOFRAN) 8 MG tablet Take 1 tablet by mouth every 8 (eight) hours as needed  for nausea.    . polyethylene glycol (MIRALAX / GLYCOLAX) packet Take by mouth.    . sertraline (ZOLOFT) 50 MG tablet 1/2 tab po QD x 6 days then increase to whole tab QD. 30 tablet 1  . tamsulosin (FLOMAX) 0.4 MG CAPS capsule Take 0.4 mg by mouth at bedtime.    Marland Kitchen zolpidem (AMBIEN) 5 MG tablet Take 1 tablet (5 mg total) by mouth at bedtime as needed for sleep. 15 tablet 1  . amLODipine (NORVASC) 5 MG tablet Take 1 tablet (5 mg total) by mouth daily. 90 tablet 1   No facility-administered medications prior to visit.     No Known Allergies  ROS Review of Systems    Objective:    Physical Exam  BP (!) 168/81 (BP Location: Left Arm, Patient Position: Sitting, Cuff Size: Normal)   Pulse 96   Temp 97.9 F (36.6 C) (Oral)   Ht 5\' 5"  (1.651 m)   Wt 170 lb (77.1 kg)   SpO2 100%   BMI 28.29 kg/m  Wt Readings from Last 3 Encounters:  08/04/19 170 lb (77.1  kg)  03/09/19 164 lb (74.4 kg)  10/04/18 176 lb (79.8 kg)     Health Maintenance Due  Topic Date Due  . MAMMOGRAM  03/04/2019  . INFLUENZA VACCINE  07/22/2019    There are no preventive care reminders to display for this patient.  Lab Results  Component Value Date   TSH 4.24 05/20/2018   Lab Results  Component Value Date   WBC 5.2 02/23/2017   HGB 15.1 02/23/2017   HCT 45.1 (H) 02/23/2017   MCV 91.1 02/23/2017   PLT 200 02/23/2017   Lab Results  Component Value Date   NA 140 05/20/2018   K 4.3 05/20/2018   CO2 25 05/20/2018   GLUCOSE 92 05/20/2018   BUN 20 05/20/2018   CREATININE 0.87 05/20/2018   BILITOT 0.5 05/20/2018   ALKPHOS 81 02/19/2017   AST 23 05/20/2018   ALT 18 05/20/2018   PROT 7.7 05/20/2018   ALBUMIN 4.2 02/19/2017   CALCIUM 9.3 05/20/2018   Lab Results  Component Value Date   CHOL 203 (H) 05/20/2018   Lab Results  Component Value Date   HDL 40 (L) 05/20/2018   Lab Results  Component Value Date   LDLCALC 145 (H) 05/20/2018   Lab Results  Component Value Date   TRIG 79 05/20/2018   Lab Results  Component Value Date   CHOLHDL 5.1 (H) 05/20/2018   No results found for: HGBA1C    Assessment & Plan:   Problem List Items Addressed This Visit      Cardiovascular and Mediastinum   HYPERTENSION, BENIGN SYSTEMIC    BP elevated today. Will monitor. Usually better at home.        Relevant Medications   enoxaparin (LOVENOX) 40 MG/0.4ML injection     Genitourinary   Endometrial adenocarcinoma ([place])    Her next chemotherapy appointment is in about 12 days.  She has been spaced about 3 weeks apart and still has 3 more treatments to do.  I would like to give her her flu vaccine but I think we need to time it correctly so that she will have the best response possible.  Encouraged her to speak to her oncologist about timing of the vaccine.       Other Visit Diagnoses    Pyelonephritis    -  Primary   Relevant Orders   Urine Culture  Pyelonephritis-she completed her Keflex 2 days ago.  I like her to repeat a urine culture just to make sure that we have cleared up the infection.  She was unable to give a sample today so gave her a cup to take home and give a sample.  Instructed her on how to collect it.  Fall-she did have a recent fall in fact was actually at the hospital and fractured a vertebrae.  She had to go to the restroom and had asked for help several times and says no one came to help her.  She says that she has urgency and so could not hold it any longer so try to go on her own and actually fell.  No orders of the defined types were placed in this encounter.   Follow-up: Return in about 6 months (around 02/04/2020).    [name], MD

## 2019-08-04 NOTE — Assessment & Plan Note (Signed)
BP elevated today. Will monitor. Usually better at home.

## 2019-08-07 ENCOUNTER — Ambulatory Visit (INDEPENDENT_AMBULATORY_CARE_PROVIDER_SITE_OTHER): Payer: Medicare HMO | Admitting: *Deleted

## 2019-08-07 VITALS — BP 142/95 | HR 88 | Ht 65.0 in | Wt 170.0 lb

## 2019-08-07 DIAGNOSIS — Z0000 Encounter for general adult medical examination without abnormal findings: Secondary | ICD-10-CM

## 2019-08-07 NOTE — Patient Instructions (Addendum)
Amy Wilson , Thank you for taking time to come for your Medicare Wellness Visit. I appreciate your ongoing commitment to your health goals. Please review the following plan we discussed and let me know if I can assist you in the future. Please schedule your next medicare wellness visit with me in 1 yr. Continue doing brain stimulating activities (puzzles, reading, adult coloring books, staying active) to keep memory sharp.  Bring a copy of your living will and/or healthcare power of attorney to your next office visit. These are the goals we discussed: Goals    . Patient Stated     Patient stated would like to feel better and not be so tired from the chemo. Exercise more and not get so tired

## 2019-08-10 DIAGNOSIS — N12 Tubulo-interstitial nephritis, not specified as acute or chronic: Secondary | ICD-10-CM | POA: Diagnosis not present

## 2019-08-11 LAB — URINE CULTURE
MICRO NUMBER:: 793966
Result:: NO GROWTH
SPECIMEN QUALITY:: ADEQUATE

## 2019-08-14 ENCOUNTER — Encounter: Payer: Medicare HMO | Admitting: Family Medicine

## 2019-08-15 NOTE — Progress Notes (Signed)
Error

## 2019-08-16 DIAGNOSIS — C770 Secondary and unspecified malignant neoplasm of lymph nodes of head, face and neck: Secondary | ICD-10-CM | POA: Diagnosis not present

## 2019-08-16 DIAGNOSIS — C541 Malignant neoplasm of endometrium: Secondary | ICD-10-CM | POA: Diagnosis not present

## 2019-08-16 DIAGNOSIS — C772 Secondary and unspecified malignant neoplasm of intra-abdominal lymph nodes: Secondary | ICD-10-CM | POA: Diagnosis not present

## 2019-08-16 DIAGNOSIS — C775 Secondary and unspecified malignant neoplasm of intrapelvic lymph nodes: Secondary | ICD-10-CM | POA: Diagnosis not present

## 2019-08-16 DIAGNOSIS — Z9889 Other specified postprocedural states: Secondary | ICD-10-CM | POA: Diagnosis not present

## 2019-08-18 DIAGNOSIS — C770 Secondary and unspecified malignant neoplasm of lymph nodes of head, face and neck: Secondary | ICD-10-CM | POA: Diagnosis not present

## 2019-08-18 DIAGNOSIS — C775 Secondary and unspecified malignant neoplasm of intrapelvic lymph nodes: Secondary | ICD-10-CM | POA: Diagnosis not present

## 2019-08-18 DIAGNOSIS — C772 Secondary and unspecified malignant neoplasm of intra-abdominal lymph nodes: Secondary | ICD-10-CM | POA: Diagnosis not present

## 2019-08-18 DIAGNOSIS — C541 Malignant neoplasm of endometrium: Secondary | ICD-10-CM | POA: Diagnosis not present

## 2019-08-18 DIAGNOSIS — Z9889 Other specified postprocedural states: Secondary | ICD-10-CM | POA: Diagnosis not present

## 2019-09-06 DIAGNOSIS — Z79899 Other long term (current) drug therapy: Secondary | ICD-10-CM | POA: Diagnosis not present

## 2019-09-06 DIAGNOSIS — C775 Secondary and unspecified malignant neoplasm of intrapelvic lymph nodes: Secondary | ICD-10-CM | POA: Diagnosis not present

## 2019-09-06 DIAGNOSIS — Z5111 Encounter for antineoplastic chemotherapy: Secondary | ICD-10-CM | POA: Diagnosis not present

## 2019-09-06 DIAGNOSIS — C541 Malignant neoplasm of endometrium: Secondary | ICD-10-CM | POA: Diagnosis not present

## 2019-09-06 DIAGNOSIS — C772 Secondary and unspecified malignant neoplasm of intra-abdominal lymph nodes: Secondary | ICD-10-CM | POA: Diagnosis not present

## 2019-09-06 DIAGNOSIS — C770 Secondary and unspecified malignant neoplasm of lymph nodes of head, face and neck: Secondary | ICD-10-CM | POA: Diagnosis not present

## 2019-09-11 ENCOUNTER — Other Ambulatory Visit: Payer: Self-pay

## 2019-09-11 ENCOUNTER — Ambulatory Visit (INDEPENDENT_AMBULATORY_CARE_PROVIDER_SITE_OTHER): Payer: Medicare HMO | Admitting: Family Medicine

## 2019-09-11 DIAGNOSIS — Z23 Encounter for immunization: Secondary | ICD-10-CM | POA: Diagnosis not present

## 2019-09-14 DIAGNOSIS — Z90711 Acquired absence of uterus with remaining cervical stump: Secondary | ICD-10-CM | POA: Diagnosis not present

## 2019-09-14 DIAGNOSIS — Z9221 Personal history of antineoplastic chemotherapy: Secondary | ICD-10-CM | POA: Diagnosis not present

## 2019-09-14 DIAGNOSIS — C778 Secondary and unspecified malignant neoplasm of lymph nodes of multiple regions: Secondary | ICD-10-CM | POA: Diagnosis not present

## 2019-09-14 DIAGNOSIS — C541 Malignant neoplasm of endometrium: Secondary | ICD-10-CM | POA: Diagnosis not present

## 2019-09-27 DIAGNOSIS — Z95828 Presence of other vascular implants and grafts: Secondary | ICD-10-CM | POA: Diagnosis not present

## 2019-09-27 DIAGNOSIS — C775 Secondary and unspecified malignant neoplasm of intrapelvic lymph nodes: Secondary | ICD-10-CM | POA: Diagnosis not present

## 2019-09-27 DIAGNOSIS — Z79899 Other long term (current) drug therapy: Secondary | ICD-10-CM | POA: Diagnosis not present

## 2019-09-27 DIAGNOSIS — E669 Obesity, unspecified: Secondary | ICD-10-CM | POA: Diagnosis not present

## 2019-09-27 DIAGNOSIS — C770 Secondary and unspecified malignant neoplasm of lymph nodes of head, face and neck: Secondary | ICD-10-CM | POA: Diagnosis not present

## 2019-09-27 DIAGNOSIS — Z5111 Encounter for antineoplastic chemotherapy: Secondary | ICD-10-CM | POA: Diagnosis not present

## 2019-09-27 DIAGNOSIS — Z6828 Body mass index (BMI) 28.0-28.9, adult: Secondary | ICD-10-CM | POA: Diagnosis not present

## 2019-09-27 DIAGNOSIS — C772 Secondary and unspecified malignant neoplasm of intra-abdominal lymph nodes: Secondary | ICD-10-CM | POA: Diagnosis not present

## 2019-09-27 DIAGNOSIS — C541 Malignant neoplasm of endometrium: Secondary | ICD-10-CM | POA: Diagnosis not present

## 2019-09-27 DIAGNOSIS — Z7901 Long term (current) use of anticoagulants: Secondary | ICD-10-CM | POA: Diagnosis not present

## 2019-10-09 DIAGNOSIS — C541 Malignant neoplasm of endometrium: Secondary | ICD-10-CM | POA: Diagnosis not present

## 2019-10-10 DIAGNOSIS — C778 Secondary and unspecified malignant neoplasm of lymph nodes of multiple regions: Secondary | ICD-10-CM | POA: Diagnosis not present

## 2019-10-10 DIAGNOSIS — C541 Malignant neoplasm of endometrium: Secondary | ICD-10-CM | POA: Diagnosis not present

## 2019-10-10 DIAGNOSIS — Z510 Encounter for antineoplastic radiation therapy: Secondary | ICD-10-CM | POA: Diagnosis not present

## 2019-10-10 DIAGNOSIS — Z9221 Personal history of antineoplastic chemotherapy: Secondary | ICD-10-CM | POA: Diagnosis not present

## 2019-10-10 DIAGNOSIS — Z90711 Acquired absence of uterus with remaining cervical stump: Secondary | ICD-10-CM | POA: Diagnosis not present

## 2019-10-16 ENCOUNTER — Encounter: Payer: Self-pay | Admitting: Family Medicine

## 2019-10-16 ENCOUNTER — Ambulatory Visit (INDEPENDENT_AMBULATORY_CARE_PROVIDER_SITE_OTHER): Payer: Medicare HMO | Admitting: Family Medicine

## 2019-10-16 ENCOUNTER — Other Ambulatory Visit: Payer: Self-pay

## 2019-10-16 VITALS — BP 146/87 | HR 108 | Ht 65.0 in | Wt 171.0 lb

## 2019-10-16 DIAGNOSIS — Z780 Asymptomatic menopausal state: Secondary | ICD-10-CM

## 2019-10-16 DIAGNOSIS — I10 Essential (primary) hypertension: Secondary | ICD-10-CM

## 2019-10-16 DIAGNOSIS — Z1211 Encounter for screening for malignant neoplasm of colon: Secondary | ICD-10-CM | POA: Diagnosis not present

## 2019-10-16 DIAGNOSIS — Z1231 Encounter for screening mammogram for malignant neoplasm of breast: Secondary | ICD-10-CM | POA: Diagnosis not present

## 2019-10-16 DIAGNOSIS — C541 Malignant neoplasm of endometrium: Secondary | ICD-10-CM

## 2019-10-16 NOTE — Assessment & Plan Note (Signed)
Blood pressure mildly elevated today.  We discussed keeping track at home but she does have a blood pressure cuff.  If it is running greater than 140 then encourage her to restart at least a half a tab in the metoprolol daily.  Or okay to take on days or maybe she is going for an appointment and is worried that it will be elevated she does tend to get very anxious and has a very brisk response with her blood pressure of known her for years and have seen this happen on multiple occasions.

## 2019-10-16 NOTE — Patient Instructions (Signed)
Check your blood pressure at home.  If it is running in the 140s or higher than restart a half a tab of the metoprolol.  Plus she can take a half a tab on days when you are going to a doctor's appointment where it might be a little bit elevated.

## 2019-10-16 NOTE — Progress Notes (Signed)
Established Patient Office Visit  Subjective:  Patient ID: Amy Wilson, female    DOB: 1951/09/23  Age: 68 y.o. MRN: [id number]  CC:  Chief Complaint  Patient presents with  . Hypertension    HPI ABHIGNA HOOLE presents for   Hypertension- Pt denies chest pain, SOB, dizziness, or heart palpitations.  She is currently off of metoprolol.  She was actually having hypotensive episodes so we held the medication.  Recently when she has gone for some of her oncology appointments her blood pressure has been elevated and so that is why she came in today.  She gets SOB easily with activity.  Fatigues easily.  She denies any cough or swelling.  She says just any activity she just feels completely exhausted.  Even to try to wash the dishes last week caused her to feel extremely fatigued for another day or so.  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  She now has a left supraclavicular and retroperitoneal adenopathy.  She is now going to start radiation therapy to the pelvis and periaortic's.  She will start on November 9.  He also wanted let me know that she recently found out that her maternal grandmother actually died from colon cancer and stomach cancer.  She says she still does not sleep well but never tried the Ambien.  Past Medical History:  Diagnosis Date  . Cancer [facility])    uterine    Past Surgical History:  Procedure Laterality Date  . ABDOMINAL HYSTERECTOMY    . kidney stents    . KNEE ARTHROSCOPY Right     Family History  Problem Relation Age of Onset  . Heart disease Father   . Colon cancer Maternal Grandmother   . Stomach cancer Maternal Grandmother     Social History   Socioeconomic History  . Marital status: Divorced    Spouse name: Not on file  . Number of children: 2  . Years of education: 82  . Highest education level:  12th grade  Occupational History  . Occupation: retail    Comment: retired  Scientific laboratory technician  . Financial resource strain: Not hard at all  . Food insecurity    Worry: Never true    Inability: Never true  . Transportation needs    Medical: No    Non-medical: No  Tobacco Use  . Smoking status: Never Smoker  . Smokeless tobacco: Never Used  Substance and Sexual Activity  . Alcohol use: Never    Frequency: Never  . Drug use: No  . Sexual activity: Never  Lifestyle  . Physical activity    Days per week: 7 days    Minutes per session: 10 min  . Stress: Not at all  Relationships  . Social Herbalist on phone: More than three times a week    Gets together: Never    Attends religious service: Never    Active member of club or organization: No    Attends meetings of clubs or organizations: Never    Relationship status: Divorced  . Intimate partner violence    Fear of current or ex partner: No    Emotionally abused: No    Physically abused: No    Forced sexual activity: No  Other Topics Concern  . Not on file  Social History Narrative   Patient has cancer and doing chemo  and radiation. Stays tired all the time    Outpatient Medications Prior to Visit  Medication Sig Dispense Refill  . calcium carbonate (OS-CAL - DOSED IN MG OF ELEMENTAL CALCIUM) 1250 (500 Ca) MG tablet Take 1 tablet by mouth daily.    . cholecalciferol (VITAMIN D) 25 MCG (1000 UT) tablet Take 1 tablet by mouth daily.    Marland Kitchen loratadine (CLARITIN) 10 MG tablet Take 10 mg by mouth daily.    . ondansetron (ZOFRAN) 8 MG tablet Take 1 tablet by mouth every 8 (eight) hours as needed for nausea.    . polyethylene glycol (MIRALAX / GLYCOLAX) packet Take by mouth.    . metoprolol succinate (TOPROL-XL) 25 MG 24 hr tablet Take 1 tablet (25 mg total) by mouth daily. (Patient not taking: Reported on 08/07/2019) 90 tablet 1  . enoxaparin (LOVENOX) 40 MG/0.4ML injection Inject 40 mg into the skin daily. injecy 0.58mls for  28 days    . sertraline (ZOLOFT) 50 MG tablet 1/2 tab po QD x 6 days then increase to whole tab QD. (Patient not taking: Reported on 08/07/2019) 30 tablet 1  . tamsulosin (FLOMAX) 0.4 MG CAPS capsule Take 0.4 mg by mouth at bedtime.    Marland Kitchen zolpidem (AMBIEN) 5 MG tablet Take 1 tablet (5 mg total) by mouth at bedtime as needed for sleep. (Patient not taking: Reported on 08/07/2019) 15 tablet 1   No facility-administered medications prior to visit.     No Known Allergies  ROS Review of Systems    Objective:    Physical Exam  BP (!) 146/87   Pulse (!) 108   Ht 5\' 5"  (1.651 m)   Wt 171 lb (77.6 kg)   SpO2 100%   BMI 28.46 kg/m  Wt Readings from Last 3 Encounters:  10/16/19 171 lb (77.6 kg)  08/07/19 170 lb (77.1 kg)  08/04/19 170 lb (77.1 kg)     Health Maintenance Due  Topic Date Due  . TETANUS/TDAP  05/26/1968  . COLONOSCOPY  05/27/1999  . DEXA SCAN  05/26/2014  . MAMMOGRAM  03/04/2019    There are no preventive care reminders to display for this patient.  Lab Results  Component Value Date   TSH 4.24 05/20/2018   Lab Results  Component Value Date   WBC 5.2 02/23/2017   HGB 15.1 02/23/2017   HCT 45.1 (H) 02/23/2017   MCV 91.1 02/23/2017   PLT 200 02/23/2017   Lab Results  Component Value Date   NA 140 05/20/2018   K 4.3 05/20/2018   CO2 25 05/20/2018   GLUCOSE 92 05/20/2018   BUN 20 05/20/2018   CREATININE 0.87 05/20/2018   BILITOT 0.5 05/20/2018   ALKPHOS 81 02/19/2017   AST 23 05/20/2018   ALT 18 05/20/2018   PROT 7.7 05/20/2018   ALBUMIN 4.2 02/19/2017   CALCIUM 9.3 05/20/2018   Lab Results  Component Value Date   CHOL 203 (H) 05/20/2018   Lab Results  Component Value Date   HDL 40 (L) 05/20/2018   Lab Results  Component Value Date   LDLCALC 145 (H) 05/20/2018   Lab Results  Component Value Date   TRIG 79 05/20/2018   Lab Results  Component Value Date   CHOLHDL 5.1 (H) 05/20/2018   No results found for: HGBA1C    Assessment &  Plan:   Problem List Items Addressed This Visit      Cardiovascular and Mediastinum   HYPERTENSION, BENIGN SYSTEMIC - Primary    Blood  pressure mildly elevated today.  We discussed keeping track at home but she does have a blood pressure cuff.  If it is running greater than 140 then encourage her to restart at least a half a tab in the metoprolol daily.  Or okay to take on days or maybe she is going for an appointment and is worried that it will be elevated she does tend to get very anxious and has a very brisk response with her blood pressure of known her for years and have seen this happen on multiple occasions.        Genitourinary   Endometrial adenocarcinoma ([place])    Starting radiation at [facility] on November 9.       Other Visit Diagnoses    Screening mammogram, encounter for       Relevant Orders   MM 3D SCREEN BREAST BILATERAL   Menopause       Relevant Orders   DG Bone Density   Screening for colon cancer       Relevant Orders   Cologuard      He knows she is battling metastatic cancer we did discuss today with her and her daughter doing screening for colon cancer and breast cancer.  Her daughter would really like to have her do the screenings.  We can get the mammogram scheduled here at our location at her convenience if we can get this done before she starts her radiation therapy that would be wonderful.  We had actually ordered a Cologuard test for her almost 2 years ago she says she still has the box.  Encouraged her to toss it as it has likely expired and we will order a new one.  No orders of the defined types were placed in this encounter.   Follow-up: Return if symptoms worsen or fail to improve.    [name], MD

## 2019-10-16 NOTE — Assessment & Plan Note (Addendum)
Starting radiation at [facility] on November 9.

## 2019-10-18 ENCOUNTER — Ambulatory Visit (INDEPENDENT_AMBULATORY_CARE_PROVIDER_SITE_OTHER): Payer: Medicare HMO

## 2019-10-18 ENCOUNTER — Other Ambulatory Visit: Payer: Self-pay

## 2019-10-18 DIAGNOSIS — M85852 Other specified disorders of bone density and structure, left thigh: Secondary | ICD-10-CM | POA: Diagnosis not present

## 2019-10-18 DIAGNOSIS — Z780 Asymptomatic menopausal state: Secondary | ICD-10-CM | POA: Diagnosis not present

## 2019-10-24 DIAGNOSIS — C541 Malignant neoplasm of endometrium: Secondary | ICD-10-CM | POA: Diagnosis not present

## 2019-10-24 DIAGNOSIS — Z510 Encounter for antineoplastic radiation therapy: Secondary | ICD-10-CM | POA: Diagnosis not present

## 2019-10-30 DIAGNOSIS — C541 Malignant neoplasm of endometrium: Secondary | ICD-10-CM | POA: Diagnosis not present

## 2019-10-30 DIAGNOSIS — Z510 Encounter for antineoplastic radiation therapy: Secondary | ICD-10-CM | POA: Diagnosis not present

## 2019-10-31 DIAGNOSIS — Z01818 Encounter for other preprocedural examination: Secondary | ICD-10-CM | POA: Diagnosis not present

## 2019-10-31 DIAGNOSIS — C541 Malignant neoplasm of endometrium: Secondary | ICD-10-CM | POA: Diagnosis not present

## 2019-10-31 DIAGNOSIS — Z510 Encounter for antineoplastic radiation therapy: Secondary | ICD-10-CM | POA: Diagnosis not present

## 2019-11-01 DIAGNOSIS — Z510 Encounter for antineoplastic radiation therapy: Secondary | ICD-10-CM | POA: Diagnosis not present

## 2019-11-01 DIAGNOSIS — C541 Malignant neoplasm of endometrium: Secondary | ICD-10-CM | POA: Diagnosis not present

## 2019-11-02 DIAGNOSIS — Z510 Encounter for antineoplastic radiation therapy: Secondary | ICD-10-CM | POA: Diagnosis not present

## 2019-11-02 DIAGNOSIS — C541 Malignant neoplasm of endometrium: Secondary | ICD-10-CM | POA: Diagnosis not present

## 2019-11-03 DIAGNOSIS — Z79899 Other long term (current) drug therapy: Secondary | ICD-10-CM | POA: Diagnosis not present

## 2019-11-03 DIAGNOSIS — K449 Diaphragmatic hernia without obstruction or gangrene: Secondary | ICD-10-CM | POA: Diagnosis not present

## 2019-11-03 DIAGNOSIS — N1330 Unspecified hydronephrosis: Secondary | ICD-10-CM | POA: Diagnosis not present

## 2019-11-03 DIAGNOSIS — I10 Essential (primary) hypertension: Secondary | ICD-10-CM | POA: Diagnosis not present

## 2019-11-03 DIAGNOSIS — Z510 Encounter for antineoplastic radiation therapy: Secondary | ICD-10-CM | POA: Diagnosis not present

## 2019-11-03 DIAGNOSIS — M810 Age-related osteoporosis without current pathological fracture: Secondary | ICD-10-CM | POA: Diagnosis not present

## 2019-11-03 DIAGNOSIS — Z466 Encounter for fitting and adjustment of urinary device: Secondary | ICD-10-CM | POA: Diagnosis not present

## 2019-11-03 DIAGNOSIS — Z7901 Long term (current) use of anticoagulants: Secondary | ICD-10-CM | POA: Diagnosis not present

## 2019-11-03 DIAGNOSIS — I272 Pulmonary hypertension, unspecified: Secondary | ICD-10-CM | POA: Diagnosis not present

## 2019-11-03 DIAGNOSIS — C541 Malignant neoplasm of endometrium: Secondary | ICD-10-CM | POA: Diagnosis not present

## 2019-11-03 DIAGNOSIS — K219 Gastro-esophageal reflux disease without esophagitis: Secondary | ICD-10-CM | POA: Diagnosis not present

## 2019-11-03 DIAGNOSIS — Z8589 Personal history of malignant neoplasm of other organs and systems: Secondary | ICD-10-CM | POA: Diagnosis not present

## 2019-11-06 DIAGNOSIS — Z510 Encounter for antineoplastic radiation therapy: Secondary | ICD-10-CM | POA: Diagnosis not present

## 2019-11-06 DIAGNOSIS — C541 Malignant neoplasm of endometrium: Secondary | ICD-10-CM | POA: Diagnosis not present

## 2019-11-07 DIAGNOSIS — C541 Malignant neoplasm of endometrium: Secondary | ICD-10-CM | POA: Diagnosis not present

## 2019-11-07 DIAGNOSIS — Z510 Encounter for antineoplastic radiation therapy: Secondary | ICD-10-CM | POA: Diagnosis not present

## 2019-11-09 DIAGNOSIS — C541 Malignant neoplasm of endometrium: Secondary | ICD-10-CM | POA: Diagnosis not present

## 2019-11-09 DIAGNOSIS — Z79899 Other long term (current) drug therapy: Secondary | ICD-10-CM | POA: Diagnosis not present

## 2019-11-13 ENCOUNTER — Telehealth: Payer: Self-pay

## 2019-11-13 DIAGNOSIS — C775 Secondary and unspecified malignant neoplasm of intrapelvic lymph nodes: Secondary | ICD-10-CM | POA: Diagnosis not present

## 2019-11-13 NOTE — Telephone Encounter (Signed)
Agree with above 

## 2019-11-13 NOTE — Telephone Encounter (Signed)
Larene Beach, Elysha's daughter, called and states Zadaya has had vomiting x 1 week. She states she is weak and can't keep food or fluids down. I advised her to take her to the ER.

## 2019-11-22 DIAGNOSIS — K8020 Calculus of gallbladder without cholecystitis without obstruction: Secondary | ICD-10-CM | POA: Diagnosis not present

## 2019-11-22 DIAGNOSIS — N1330 Unspecified hydronephrosis: Secondary | ICD-10-CM | POA: Diagnosis not present

## 2019-11-22 DIAGNOSIS — C541 Malignant neoplasm of endometrium: Secondary | ICD-10-CM | POA: Diagnosis not present

## 2019-11-22 DIAGNOSIS — R918 Other nonspecific abnormal finding of lung field: Secondary | ICD-10-CM | POA: Diagnosis not present

## 2019-11-22 DIAGNOSIS — K449 Diaphragmatic hernia without obstruction or gangrene: Secondary | ICD-10-CM | POA: Diagnosis not present

## 2019-11-29 DIAGNOSIS — Z79899 Other long term (current) drug therapy: Secondary | ICD-10-CM | POA: Diagnosis not present

## 2019-11-29 DIAGNOSIS — Z08 Encounter for follow-up examination after completed treatment for malignant neoplasm: Secondary | ICD-10-CM | POA: Diagnosis not present

## 2019-11-29 DIAGNOSIS — Z8542 Personal history of malignant neoplasm of other parts of uterus: Secondary | ICD-10-CM | POA: Diagnosis not present

## 2019-11-29 DIAGNOSIS — C541 Malignant neoplasm of endometrium: Secondary | ICD-10-CM | POA: Diagnosis not present

## 2019-12-07 DIAGNOSIS — N1330 Unspecified hydronephrosis: Secondary | ICD-10-CM | POA: Diagnosis not present

## 2019-12-07 DIAGNOSIS — R944 Abnormal results of kidney function studies: Secondary | ICD-10-CM | POA: Diagnosis not present

## 2019-12-11 ENCOUNTER — Encounter: Payer: Self-pay | Admitting: Family Medicine

## 2019-12-11 NOTE — Telephone Encounter (Signed)
n

## 2019-12-12 ENCOUNTER — Encounter: Payer: Self-pay | Admitting: Family Medicine

## 2019-12-19 DIAGNOSIS — I10 Essential (primary) hypertension: Secondary | ICD-10-CM | POA: Diagnosis not present

## 2019-12-19 DIAGNOSIS — N1330 Unspecified hydronephrosis: Secondary | ICD-10-CM | POA: Diagnosis not present

## 2019-12-20 DIAGNOSIS — Z01818 Encounter for other preprocedural examination: Secondary | ICD-10-CM | POA: Diagnosis not present

## 2019-12-20 DIAGNOSIS — N1330 Unspecified hydronephrosis: Secondary | ICD-10-CM | POA: Diagnosis not present

## 2019-12-21 DIAGNOSIS — K219 Gastro-esophageal reflux disease without esophagitis: Secondary | ICD-10-CM | POA: Diagnosis not present

## 2019-12-21 DIAGNOSIS — N1330 Unspecified hydronephrosis: Secondary | ICD-10-CM | POA: Diagnosis not present

## 2019-12-21 DIAGNOSIS — N189 Chronic kidney disease, unspecified: Secondary | ICD-10-CM | POA: Diagnosis not present

## 2019-12-21 DIAGNOSIS — Z9221 Personal history of antineoplastic chemotherapy: Secondary | ICD-10-CM | POA: Diagnosis not present

## 2019-12-21 DIAGNOSIS — I129 Hypertensive chronic kidney disease with stage 1 through stage 4 chronic kidney disease, or unspecified chronic kidney disease: Secondary | ICD-10-CM | POA: Diagnosis not present

## 2019-12-21 DIAGNOSIS — N132 Hydronephrosis with renal and ureteral calculous obstruction: Secondary | ICD-10-CM | POA: Diagnosis not present

## 2019-12-21 DIAGNOSIS — K449 Diaphragmatic hernia without obstruction or gangrene: Secondary | ICD-10-CM | POA: Diagnosis not present

## 2020-01-24 DIAGNOSIS — N1330 Unspecified hydronephrosis: Secondary | ICD-10-CM | POA: Diagnosis not present

## 2020-01-24 DIAGNOSIS — N390 Urinary tract infection, site not specified: Secondary | ICD-10-CM | POA: Diagnosis not present

## 2020-01-31 DIAGNOSIS — Z7901 Long term (current) use of anticoagulants: Secondary | ICD-10-CM | POA: Diagnosis not present

## 2020-01-31 DIAGNOSIS — C786 Secondary malignant neoplasm of retroperitoneum and peritoneum: Secondary | ICD-10-CM | POA: Diagnosis not present

## 2020-01-31 DIAGNOSIS — Z436 Encounter for attention to other artificial openings of urinary tract: Secondary | ICD-10-CM | POA: Diagnosis not present

## 2020-01-31 DIAGNOSIS — C541 Malignant neoplasm of endometrium: Secondary | ICD-10-CM | POA: Diagnosis not present

## 2020-01-31 DIAGNOSIS — C775 Secondary and unspecified malignant neoplasm of intrapelvic lymph nodes: Secondary | ICD-10-CM | POA: Diagnosis not present

## 2020-01-31 DIAGNOSIS — I10 Essential (primary) hypertension: Secondary | ICD-10-CM | POA: Diagnosis not present

## 2020-01-31 DIAGNOSIS — N1330 Unspecified hydronephrosis: Secondary | ICD-10-CM | POA: Diagnosis not present

## 2020-01-31 DIAGNOSIS — C770 Secondary and unspecified malignant neoplasm of lymph nodes of head, face and neck: Secondary | ICD-10-CM | POA: Diagnosis not present

## 2020-02-02 DIAGNOSIS — N1330 Unspecified hydronephrosis: Secondary | ICD-10-CM | POA: Diagnosis not present

## 2020-02-02 DIAGNOSIS — Z936 Other artificial openings of urinary tract status: Secondary | ICD-10-CM | POA: Diagnosis not present

## 2020-02-05 ENCOUNTER — Ambulatory Visit: Payer: Medicare HMO | Admitting: Family Medicine

## 2020-02-07 DIAGNOSIS — N3090 Cystitis, unspecified without hematuria: Secondary | ICD-10-CM | POA: Diagnosis not present

## 2020-02-07 DIAGNOSIS — N1330 Unspecified hydronephrosis: Secondary | ICD-10-CM | POA: Diagnosis not present

## 2020-02-28 DIAGNOSIS — Z8542 Personal history of malignant neoplasm of other parts of uterus: Secondary | ICD-10-CM | POA: Diagnosis not present

## 2020-02-28 DIAGNOSIS — I10 Essential (primary) hypertension: Secondary | ICD-10-CM | POA: Diagnosis not present

## 2020-02-28 DIAGNOSIS — I700 Atherosclerosis of aorta: Secondary | ICD-10-CM | POA: Diagnosis not present

## 2020-02-28 DIAGNOSIS — K449 Diaphragmatic hernia without obstruction or gangrene: Secondary | ICD-10-CM | POA: Diagnosis not present

## 2020-02-28 DIAGNOSIS — Z79899 Other long term (current) drug therapy: Secondary | ICD-10-CM | POA: Diagnosis not present

## 2020-02-28 DIAGNOSIS — Z466 Encounter for fitting and adjustment of urinary device: Secondary | ICD-10-CM | POA: Diagnosis not present

## 2020-02-28 DIAGNOSIS — N1330 Unspecified hydronephrosis: Secondary | ICD-10-CM | POA: Diagnosis not present

## 2020-02-28 DIAGNOSIS — Z436 Encounter for attention to other artificial openings of urinary tract: Secondary | ICD-10-CM | POA: Diagnosis not present

## 2020-03-01 ENCOUNTER — Encounter: Payer: Self-pay | Admitting: Family Medicine

## 2020-03-01 ENCOUNTER — Ambulatory Visit (INDEPENDENT_AMBULATORY_CARE_PROVIDER_SITE_OTHER): Payer: Medicare HMO | Admitting: Family Medicine

## 2020-03-01 ENCOUNTER — Other Ambulatory Visit: Payer: Self-pay

## 2020-03-01 VITALS — BP 135/81 | HR 103 | Ht 65.0 in | Wt 167.0 lb

## 2020-03-01 DIAGNOSIS — N1330 Unspecified hydronephrosis: Secondary | ICD-10-CM | POA: Diagnosis not present

## 2020-03-01 DIAGNOSIS — C775 Secondary and unspecified malignant neoplasm of intrapelvic lymph nodes: Secondary | ICD-10-CM | POA: Diagnosis not present

## 2020-03-01 DIAGNOSIS — R319 Hematuria, unspecified: Secondary | ICD-10-CM | POA: Diagnosis not present

## 2020-03-01 DIAGNOSIS — I10 Essential (primary) hypertension: Secondary | ICD-10-CM

## 2020-03-01 DIAGNOSIS — R310 Gross hematuria: Secondary | ICD-10-CM

## 2020-03-01 DIAGNOSIS — C541 Malignant neoplasm of endometrium: Secondary | ICD-10-CM

## 2020-03-01 DIAGNOSIS — Z13220 Encounter for screening for lipoid disorders: Secondary | ICD-10-CM | POA: Diagnosis not present

## 2020-03-01 NOTE — Progress Notes (Signed)
Established Patient Office Visit  Subjective:  Patient ID: Amy Wilson, female    DOB: [birth year]  Age: 69 y.o. MRN: [id number]  CC:  Chief Complaint  Patient presents with  . Follow-up    HPI Amy Wilson with metastatic endometrial carcinoma presents for   Hypertension- Pt denies chest pain, SOB, dizziness, or heart palpitations.  Taking meds as directed w/o problems.  Denies medication side effects.    F/U nephrostomy tubes. Says notices blood coming out of her right tube and  Went to the ED. They told her needed the tube exchanged. She has been getting them eschanged every 6 weeks but feels it is too long. Say they start to get really sore and crust and feels like should be done every 4 weeks. When they schedule her f/u from last tube exchange on 3/10, they scheduled it 7 weeks out.  Not sure if that was all they had avialbe. She has it done at [facility] in [place].  Follow with Urology [facility] and would like to change Urologist to [place].  [name] is here with her  Today.    She would like to get some updated labs done.     Past Medical History:  Diagnosis Date  . Cancer [facility])    uterine    Past Surgical History:  Procedure Laterality Date  . ABDOMINAL HYSTERECTOMY    . kidney stents    . KNEE ARTHROSCOPY Right     Family History  Problem Relation Age of Onset  . Heart disease Father   . Colon cancer Maternal Grandmother   . Stomach cancer Maternal Grandmother     Social History   Socioeconomic History  . Marital status: Divorced    Spouse name: Not on file  . Number of children: 2  . Years of education: 32  . Highest education level: 12th grade  Occupational History  . Occupation: retail    Comment: retired  Tobacco Use  . Smoking status: Never Smoker  . Smokeless tobacco: Never Used  Substance and Sexual Activity  . Alcohol use: Never  . Drug use: No  . Sexual activity: Never  Other Topics Concern  . Not on file  Social  History Narrative   Patient has cancer and doing chemo and radiation. Stays tired all the time   Social Determinants of Health   Financial Resource Strain: Low Risk   . Difficulty of Paying Living Expenses: Not hard at all  Food Insecurity: No Food Insecurity  . Worried About Charity fundraiser in the Last Year: Never true  . Ran Out of Food in the Last Year: Never true  Transportation Needs: No Transportation Needs  . Lack of Transportation (Medical): No  . Lack of Transportation (Non-Medical): No  Physical Activity: Insufficiently Active  . Days of Exercise per Week: 7 days  . Minutes of Exercise per Session: 10 min  Stress: No Stress Concern Present  . Feeling of Stress : Not at all  Social Connections: Moderately Isolated  . Frequency of Communication with Friends and Family: More than three times a week  . Frequency of Social Gatherings with Friends and Family: Never  . Attends Religious Services: Never  . Active Member of Clubs or Organizations: No  . Attends Archivist Meetings: Never  . Marital Status: Divorced  Human resources officer Violence: Not At Risk  . Fear of Current or Ex-Partner: No  . Emotionally Abused: No  . Physically Abused: No  .  Sexually Abused: No    Outpatient Medications Prior to Visit  Medication Sig Dispense Refill  . calcium carbonate (OS-CAL - DOSED IN MG OF ELEMENTAL CALCIUM) 1250 (500 Ca) MG tablet Take 1 tablet by mouth daily.    . cholecalciferol (VITAMIN D) 25 MCG (1000 UT) tablet Take 1 tablet by mouth daily.    Marland Kitchen esomeprazole (NEXIUM) 20 MG capsule Take 10 mg by mouth daily before breakfast. Take 10 mg 30 minutes before breakfast    . loratadine (CLARITIN) 10 MG tablet Take 10 mg by mouth daily.    . metoprolol succinate (TOPROL-XL) 25 MG 24 hr tablet Take 1 tablet (25 mg total) by mouth daily. (Patient not taking: Reported on 08/07/2019) 90 tablet 1  . ondansetron (ZOFRAN) 8 MG tablet Take 1 tablet by mouth every 8 (eight) hours as  needed for nausea.    . polyethylene glycol (MIRALAX / GLYCOLAX) packet Take by mouth.     No facility-administered medications prior to visit.    No Known Allergies  ROS Review of Systems    Objective:    Physical Exam  Constitutional: She is oriented to person, place, and time. She appears well-developed and well-nourished.  HENT:  Head: Normocephalic and atraumatic.  Cardiovascular: Normal rate, regular rhythm and normal heart sounds.  Pulmonary/Chest: Effort normal and breath sounds normal.  Neurological: She is alert and oriented to person, place, and time.  Skin: Skin is warm and dry.  Psychiatric: She has a normal mood and affect. Her behavior is normal.    BP 135/81   Pulse (!) 103   Ht 5\' 5"  (1.651 m)   Wt 167 lb (75.8 kg)   SpO2 99%   BMI 27.79 kg/m  Wt Readings from Last 3 Encounters:  03/01/20 167 lb (75.8 kg)  10/16/19 171 lb (77.6 kg)  08/07/19 170 lb (77.1 kg)     Health Maintenance Due  Topic Date Due  . COLONOSCOPY  Never done  . MAMMOGRAM  03/04/2019    There are no preventive care reminders to display for this patient.  Lab Results  Component Value Date   TSH 4.24 05/20/2018   Lab Results  Component Value Date   WBC 5.2 02/23/2017   HGB 15.1 02/23/2017   HCT 45.1 (H) 02/23/2017   MCV 91.1 02/23/2017   PLT 200 02/23/2017   Lab Results  Component Value Date   NA 140 05/20/2018   K 4.3 05/20/2018   CO2 25 05/20/2018   GLUCOSE 92 05/20/2018   BUN 20 05/20/2018   CREATININE 0.87 05/20/2018   BILITOT 0.5 05/20/2018   ALKPHOS 81 02/19/2017   AST 23 05/20/2018   ALT 18 05/20/2018   PROT 7.7 05/20/2018   ALBUMIN 4.2 02/19/2017   CALCIUM 9.3 05/20/2018   Lab Results  Component Value Date   CHOL 203 (H) 05/20/2018   Lab Results  Component Value Date   HDL 40 (L) 05/20/2018   Lab Results  Component Value Date   LDLCALC 145 (H) 05/20/2018   Lab Results  Component Value Date   TRIG 79 05/20/2018   Lab Results  Component  Value Date   CHOLHDL 5.1 (H) 05/20/2018   No results found for: HGBA1C    Assessment & Plan:   Problem List Items Addressed This Visit      Cardiovascular and Mediastinum   HYPERTENSION, BENIGN SYSTEMIC - Primary    Well controlled. Continue current regimen. 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       Relevant Orders   IR Nephrostomy Tube Change     Genitourinary   Hydronephrosis    Will refer to  Urology in [facility] for consultation and care.   Will try to get stent exchange set up with [facility].        Relevant Orders   CBC   COMPLETE METABOLIC PANEL WITH GFR   Lipid panel   TSH   Hemoglobin A1c   Ambulatory referral to Urology   POCT URINALYSIS DIP (CLINITEK)   Urinalysis w microscopic + reflex cultur (Completed)   IR Nephrostomy Tube Change   Endometrial adenocarcinoma ([place])   Relevant Orders   Ambulatory referral to Urology    Other Visit Diagnoses    Hematuria, unspecified type       Relevant Orders   CBC   COMPLETE METABOLIC PANEL WITH GFR   Lipid panel   TSH   Hemoglobin A1c   POCT URINALYSIS DIP (CLINITEK)   Urinalysis w microscopic + reflex cultur (Completed)   Screening, lipid       Relevant Orders   Lipid panel   Gross hematuria       Relevant Orders   Urine Culture (Completed)   REFLEXIVE URINE CULTURE (Completed)      No orders of the defined types were placed in this encounter.   Follow-up: Return in about 4 months (around 07/01/2020).    [name], MD

## 2020-03-03 ENCOUNTER — Encounter: Payer: Self-pay | Admitting: Family Medicine

## 2020-03-03 LAB — URINE CULTURE
MICRO NUMBER:: 10249972
Result:: NO GROWTH
SPECIMEN QUALITY:: ADEQUATE

## 2020-03-03 LAB — URINALYSIS W MICROSCOPIC + REFLEX CULTURE
Bilirubin Urine: NEGATIVE
Glucose, UA: NEGATIVE
Hyaline Cast: NONE SEEN /LPF
Ketones, ur: NEGATIVE
Nitrites, Initial: NEGATIVE
Specific Gravity, Urine: 1.018 (ref 1.001–1.03)
pH: 6 (ref 5.0–8.0)

## 2020-03-03 LAB — CULTURE INDICATED

## 2020-03-03 NOTE — Assessment & Plan Note (Signed)
Well controlled. Continue current regimen. Follow up in  6 mo  

## 2020-03-03 NOTE — Assessment & Plan Note (Addendum)
Will refer to  Urology in [facility] for consultation and care.   Will try to get stent exchange set up with [facility].

## 2020-03-03 NOTE — Assessment & Plan Note (Signed)
Mets causing hydronephrosis  Requiring stents.

## 2020-03-05 ENCOUNTER — Other Ambulatory Visit: Payer: Self-pay | Admitting: Family Medicine

## 2020-03-05 DIAGNOSIS — C772 Secondary and unspecified malignant neoplasm of intra-abdominal lymph nodes: Secondary | ICD-10-CM | POA: Diagnosis not present

## 2020-03-05 DIAGNOSIS — C7989 Secondary malignant neoplasm of other specified sites: Secondary | ICD-10-CM | POA: Diagnosis not present

## 2020-03-05 DIAGNOSIS — R200 Anesthesia of skin: Secondary | ICD-10-CM | POA: Diagnosis not present

## 2020-03-05 DIAGNOSIS — R319 Hematuria, unspecified: Secondary | ICD-10-CM | POA: Diagnosis not present

## 2020-03-05 DIAGNOSIS — C541 Malignant neoplasm of endometrium: Secondary | ICD-10-CM | POA: Diagnosis not present

## 2020-03-05 DIAGNOSIS — C770 Secondary and unspecified malignant neoplasm of lymph nodes of head, face and neck: Secondary | ICD-10-CM | POA: Diagnosis not present

## 2020-03-05 DIAGNOSIS — R102 Pelvic and perineal pain: Secondary | ICD-10-CM | POA: Diagnosis not present

## 2020-03-05 DIAGNOSIS — N1330 Unspecified hydronephrosis: Secondary | ICD-10-CM | POA: Diagnosis not present

## 2020-03-05 DIAGNOSIS — Z13220 Encounter for screening for lipoid disorders: Secondary | ICD-10-CM | POA: Diagnosis not present

## 2020-03-05 DIAGNOSIS — C775 Secondary and unspecified malignant neoplasm of intrapelvic lymph nodes: Secondary | ICD-10-CM | POA: Diagnosis not present

## 2020-03-05 DIAGNOSIS — M545 Low back pain: Secondary | ICD-10-CM | POA: Diagnosis not present

## 2020-03-05 DIAGNOSIS — I10 Essential (primary) hypertension: Secondary | ICD-10-CM | POA: Diagnosis not present

## 2020-03-06 LAB — CBC
HCT: 39.8 % (ref 35.0–45.0)
Hemoglobin: 13.1 g/dL (ref 11.7–15.5)
MCH: 29.2 pg (ref 27.0–33.0)
MCHC: 32.9 g/dL (ref 32.0–36.0)
MCV: 88.6 fL (ref 80.0–100.0)
MPV: 10.4 fL (ref 7.5–12.5)
Platelets: 158 10*3/uL (ref 140–400)
RBC: 4.49 10*6/uL (ref 3.80–5.10)
RDW: 14.5 % (ref 11.0–15.0)
WBC: 7.2 10*3/uL (ref 3.8–10.8)

## 2020-03-06 LAB — COMPLETE METABOLIC PANEL WITH GFR
AG Ratio: 1.3 (calc) (ref 1.0–2.5)
ALT: 14 U/L (ref 6–29)
AST: 16 U/L (ref 10–35)
Albumin: 4.1 g/dL (ref 3.6–5.1)
Alkaline phosphatase (APISO): 94 U/L (ref 37–153)
BUN/Creatinine Ratio: 18 (calc) (ref 6–22)
BUN: 17 mg/dL (ref 7–25)
CO2: 26 mmol/L (ref 20–32)
Calcium: 9.3 mg/dL (ref 8.6–10.4)
Chloride: 102 mmol/L (ref 98–110)
Creat: 0.97 mg/dL — ABNORMAL HIGH (ref 0.60–0.93)
GFR, Est African American: 69 mL/min/{1.73_m2} (ref >=60)
GFR, Est Non African American: 59 mL/min/{1.73_m2} — ABNORMAL LOW (ref >=60)
Globulin: 3.1 g/dL (calc) (ref 1.9–3.7)
Glucose, Bld: 94 mg/dL (ref 65–99)
Potassium: 4 mmol/L (ref 3.5–5.3)
Sodium: 138 mmol/L (ref 135–146)
Total Bilirubin: 0.5 mg/dL (ref 0.2–1.2)
Total Protein: 7.2 g/dL (ref 6.1–8.1)

## 2020-03-06 LAB — LIPID PANEL
Cholesterol: 187 mg/dL (ref <200)
HDL: 29 mg/dL — ABNORMAL LOW (ref >=50)
LDL Cholesterol (Calc): 130 mg/dL (calc) — ABNORMAL HIGH
Non-HDL Cholesterol (Calc): 158 mg/dL (calc) — ABNORMAL HIGH (ref <130)
Total CHOL/HDL Ratio: 6.4 (calc) — ABNORMAL HIGH (ref <5.0)
Triglycerides: 166 mg/dL — ABNORMAL HIGH (ref <150)

## 2020-03-06 LAB — HEMOGLOBIN A1C
Hgb A1c MFr Bld: 5.3 % of total Hgb (ref <5.7)
Mean Plasma Glucose: 105 (calc)
eAG (mmol/L): 5.8 (calc)

## 2020-03-06 LAB — TSH: TSH: 3.7 mIU/L (ref 0.40–4.50)

## 2020-03-13 ENCOUNTER — Encounter: Payer: Self-pay | Admitting: Family Medicine

## 2020-03-13 DIAGNOSIS — C541 Malignant neoplasm of endometrium: Secondary | ICD-10-CM | POA: Diagnosis not present

## 2020-03-13 DIAGNOSIS — N2889 Other specified disorders of kidney and ureter: Secondary | ICD-10-CM | POA: Diagnosis not present

## 2020-03-13 DIAGNOSIS — K449 Diaphragmatic hernia without obstruction or gangrene: Secondary | ICD-10-CM | POA: Diagnosis not present

## 2020-03-13 DIAGNOSIS — K8020 Calculus of gallbladder without cholecystitis without obstruction: Secondary | ICD-10-CM | POA: Diagnosis not present

## 2020-04-02 DIAGNOSIS — N1330 Unspecified hydronephrosis: Secondary | ICD-10-CM | POA: Diagnosis not present

## 2020-04-02 DIAGNOSIS — R319 Hematuria, unspecified: Secondary | ICD-10-CM | POA: Diagnosis not present

## 2020-04-09 ENCOUNTER — Other Ambulatory Visit: Payer: Self-pay | Admitting: Family Medicine

## 2020-04-09 DIAGNOSIS — N63 Unspecified lump in unspecified breast: Secondary | ICD-10-CM

## 2020-04-10 DIAGNOSIS — N135 Crossing vessel and stricture of ureter without hydronephrosis: Secondary | ICD-10-CM | POA: Diagnosis not present

## 2020-04-11 DIAGNOSIS — C541 Malignant neoplasm of endometrium: Secondary | ICD-10-CM | POA: Diagnosis not present

## 2020-04-11 DIAGNOSIS — Z91040 Latex allergy status: Secondary | ICD-10-CM | POA: Diagnosis not present

## 2020-04-11 DIAGNOSIS — I10 Essential (primary) hypertension: Secondary | ICD-10-CM | POA: Diagnosis not present

## 2020-04-11 DIAGNOSIS — Z79899 Other long term (current) drug therapy: Secondary | ICD-10-CM | POA: Diagnosis not present

## 2020-04-11 DIAGNOSIS — N131 Hydronephrosis with ureteral stricture, not elsewhere classified: Secondary | ICD-10-CM | POA: Diagnosis not present

## 2020-04-11 DIAGNOSIS — Z436 Encounter for attention to other artificial openings of urinary tract: Secondary | ICD-10-CM | POA: Diagnosis not present

## 2020-04-11 DIAGNOSIS — Z8589 Personal history of malignant neoplasm of other organs and systems: Secondary | ICD-10-CM | POA: Diagnosis not present

## 2020-04-24 DIAGNOSIS — N135 Crossing vessel and stricture of ureter without hydronephrosis: Secondary | ICD-10-CM | POA: Diagnosis not present

## 2020-05-02 ENCOUNTER — Encounter: Payer: Self-pay | Admitting: Family Medicine

## 2020-05-22 DIAGNOSIS — Z466 Encounter for fitting and adjustment of urinary device: Secondary | ICD-10-CM | POA: Diagnosis not present

## 2020-05-22 DIAGNOSIS — N135 Crossing vessel and stricture of ureter without hydronephrosis: Secondary | ICD-10-CM | POA: Diagnosis not present

## 2020-05-22 DIAGNOSIS — Z923 Personal history of irradiation: Secondary | ICD-10-CM | POA: Diagnosis not present

## 2020-05-22 DIAGNOSIS — Z8542 Personal history of malignant neoplasm of other parts of uterus: Secondary | ICD-10-CM | POA: Diagnosis not present

## 2020-05-22 DIAGNOSIS — Z9221 Personal history of antineoplastic chemotherapy: Secondary | ICD-10-CM | POA: Diagnosis not present

## 2020-05-22 DIAGNOSIS — Z90710 Acquired absence of both cervix and uterus: Secondary | ICD-10-CM | POA: Diagnosis not present

## 2020-05-27 DIAGNOSIS — N135 Crossing vessel and stricture of ureter without hydronephrosis: Secondary | ICD-10-CM | POA: Insufficient documentation

## 2020-05-27 DIAGNOSIS — N2889 Other specified disorders of kidney and ureter: Secondary | ICD-10-CM | POA: Diagnosis not present

## 2020-06-04 DIAGNOSIS — N1339 Other hydronephrosis: Secondary | ICD-10-CM | POA: Diagnosis not present

## 2020-06-04 DIAGNOSIS — R5383 Other fatigue: Secondary | ICD-10-CM | POA: Diagnosis not present

## 2020-06-04 DIAGNOSIS — G620 Drug-induced polyneuropathy: Secondary | ICD-10-CM | POA: Diagnosis not present

## 2020-06-04 DIAGNOSIS — C541 Malignant neoplasm of endometrium: Secondary | ICD-10-CM | POA: Diagnosis not present

## 2020-06-04 DIAGNOSIS — T451X5A Adverse effect of antineoplastic and immunosuppressive drugs, initial encounter: Secondary | ICD-10-CM | POA: Diagnosis not present

## 2020-07-03 DIAGNOSIS — K219 Gastro-esophageal reflux disease without esophagitis: Secondary | ICD-10-CM | POA: Insufficient documentation

## 2020-07-03 DIAGNOSIS — N1832 Chronic kidney disease, stage 3b: Secondary | ICD-10-CM | POA: Insufficient documentation

## 2020-07-05 DIAGNOSIS — N135 Crossing vessel and stricture of ureter without hydronephrosis: Secondary | ICD-10-CM | POA: Diagnosis not present

## 2020-07-05 DIAGNOSIS — Z20822 Contact with and (suspected) exposure to covid-19: Secondary | ICD-10-CM | POA: Diagnosis not present

## 2020-07-05 DIAGNOSIS — Z01812 Encounter for preprocedural laboratory examination: Secondary | ICD-10-CM | POA: Diagnosis not present

## 2020-07-10 DIAGNOSIS — N135 Crossing vessel and stricture of ureter without hydronephrosis: Secondary | ICD-10-CM | POA: Diagnosis not present

## 2020-07-11 DIAGNOSIS — N135 Crossing vessel and stricture of ureter without hydronephrosis: Secondary | ICD-10-CM | POA: Diagnosis not present

## 2020-07-11 DIAGNOSIS — Z936 Other artificial openings of urinary tract status: Secondary | ICD-10-CM | POA: Diagnosis not present

## 2020-07-11 DIAGNOSIS — N131 Hydronephrosis with ureteral stricture, not elsewhere classified: Secondary | ICD-10-CM | POA: Diagnosis not present

## 2020-07-18 ENCOUNTER — Ambulatory Visit (INDEPENDENT_AMBULATORY_CARE_PROVIDER_SITE_OTHER): Payer: Medicare HMO | Admitting: Family Medicine

## 2020-07-18 VITALS — BP 130/75 | HR 107 | Ht 65.0 in | Wt 158.0 lb

## 2020-07-18 DIAGNOSIS — M792 Neuralgia and neuritis, unspecified: Secondary | ICD-10-CM

## 2020-07-18 DIAGNOSIS — F339 Major depressive disorder, recurrent, unspecified: Secondary | ICD-10-CM

## 2020-07-18 MED ORDER — DULOXETINE HCL 20 MG PO CSDR: 20.0000 mg | DELAYED_RELEASE_CAPSULE | Freq: Every day | ORAL | 1 refills | Status: DC

## 2020-07-18 NOTE — Progress Notes (Signed)
Established Patient Office Visit  Subjective:  Patient ID: Amy Wilson, female    DOB: 12-Jul-1951  Age: 69 y.o. MRN: [id number]  CC:  Chief Complaint  Patient presents with  . Peripheral Neuropathy    HPI Amy Wilson presents for needing peripheral neuropathy.  She feels like it is now coming up her legs towards her abdomen.  She did see neurology and they gave her a prescription for gabapentin.  She took 1 dose and felt like she had a hangover so discontinued it.  She wondered if there are any other options that might be helpful.  It contributes to some gait instability and recent fall.  Past Medical History:  Diagnosis Date  . Cancer [facility]    uterine    Past Surgical History:  Procedure Laterality Date  . ABDOMINAL HYSTERECTOMY    . kidney stents    . KNEE ARTHROSCOPY Right     Family History  Problem Relation Age of Onset  . Heart disease Father   . Colon cancer Maternal Grandmother   . Stomach cancer Maternal Grandmother     Social History   Socioeconomic History  . Marital status: Divorced    Spouse name: Not on file  . Number of children: 2  . Years of education: 67  . Highest education level: 12th grade  Occupational History  . Occupation: retail    Comment: retired  Tobacco Use  . Smoking status: Never Smoker  . Smokeless tobacco: Never Used  Vaping Use  . Vaping Use: Never used  Substance and Sexual Activity  . Alcohol use: Never  . Drug use: No  . Sexual activity: Never  Other Topics Concern  . Not on file  Social History Narrative   Patient has cancer and doing chemo and radiation. Stays tired all the time   Social Determinants of Health   Financial Resource Strain: Low Risk   . Difficulty of Paying Living Expenses: Not hard at all  Food Insecurity: No Food Insecurity  . Worried About Charity fundraiser in the Last Year: Never true  . Ran Out of Food in the Last Year: Never true  Transportation Needs: No Transportation Needs  .  Lack of Transportation (Medical): No  . Lack of Transportation (Non-Medical): No  Physical Activity: Insufficiently Active  . Days of Exercise per Week: 7 days  . Minutes of Exercise per Session: 10 min  Stress: No Stress Concern Present  . Feeling of Stress : Not at all  Social Connections: Socially Isolated  . Frequency of Communication with Friends and Family: More than three times a week  . Frequency of Social Gatherings with Friends and Family: Never  . Attends Religious Services: Never  . Active Member of Clubs or Organizations: No  . Attends Archivist Meetings: Never  . Marital Status: Divorced  Human resources officer Violence: Not At Risk  . Fear of Current or Ex-Partner: No  . Emotionally Abused: No  . Physically Abused: No  . Sexually Abused: No    Outpatient Medications Prior to Visit  Medication Sig Dispense Refill  . calcium carbonate (OS-CAL - DOSED IN MG OF ELEMENTAL CALCIUM) 1250 (500 Ca) MG tablet Take 1 tablet by mouth daily.    . cholecalciferol (VITAMIN D) 25 MCG (1000 UT) tablet Take 1 tablet by mouth daily.    Marland Kitchen esomeprazole (NEXIUM) 20 MG capsule Take 10 mg by mouth daily before breakfast. Take 10 mg 30 minutes before breakfast  No facility-administered medications prior to visit.    Allergies  Allergen Reactions  . Latex Itching and Rash    ROS Review of Systems    Objective:    Physical Exam Constitutional:      Appearance: She is well-developed.  HENT:     Head: Normocephalic and atraumatic.  Cardiovascular:     Rate and Rhythm: Normal rate and regular rhythm.     Heart sounds: Normal heart sounds.  Pulmonary:     Effort: Pulmonary effort is normal.     Breath sounds: Normal breath sounds.  Skin:    General: Skin is warm and dry.  Neurological:     Mental Status: She is alert and oriented to person, place, and time.  Psychiatric:        Behavior: Behavior normal.     BP (!) 130/75   Pulse (!) 107   Ht 5\' 5"  (1.651 m)    Wt 158 lb (71.7 kg)   SpO2 100%   BMI 26.29 kg/m  Wt Readings from Last 3 Encounters:  07/18/20 158 lb (71.7 kg)  03/01/20 167 lb (75.8 kg)  10/16/19 171 lb (77.6 kg)     Health Maintenance Due  Topic Date Due  . MAMMOGRAM  03/04/2019  . INFLUENZA VACCINE  07/21/2020    There are no preventive care reminders to display for this patient.  Lab Results  Component Value Date   TSH 3.70 03/05/2020   Lab Results  Component Value Date   WBC 7.2 03/05/2020   HGB 13.1 03/05/2020   HCT 39.8 03/05/2020   MCV 88.6 03/05/2020   PLT 158 03/05/2020   Lab Results  Component Value Date   NA 138 03/05/2020   K 4.0 03/05/2020   CO2 26 03/05/2020   GLUCOSE 94 03/05/2020   BUN 17 03/05/2020   CREATININE 0.97 (H) 03/05/2020   BILITOT 0.5 03/05/2020   ALKPHOS 81 02/19/2017   AST 16 03/05/2020   ALT 14 03/05/2020   PROT 7.2 03/05/2020   ALBUMIN 4.2 02/19/2017   CALCIUM 9.3 03/05/2020   Lab Results  Component Value Date   CHOL 187 03/05/2020   Lab Results  Component Value Date   HDL 29 (L) 03/05/2020   Lab Results  Component Value Date   LDLCALC 130 (H) 03/05/2020   Lab Results  Component Value Date   TRIG 166 (H) 03/05/2020   Lab Results  Component Value Date   CHOLHDL 6.4 (H) 03/05/2020   Lab Results  Component Value Date   HGBA1C 5.3 03/05/2020      Assessment & Plan:   Problem List Items Addressed This Visit      Other   Peripheral neuropathic pain - Primary    We discussed several options.  I did warn that gabapentin can be sedating.  She was originally started on a 300 mg.  So we discussed the option of starting lower may be at 100 mg to see if that could be helpful and much less sedating for her.  We also discussed other FDA approved options such as Cymbalta and Lyrica.  She says she is open to trying duloxetine I think this could also help with some depression symptoms that she is also been experiencing.  Plan to follow-up in 3 to 4 weeks to make sure  that she is doing well.  Did discuss that she will hopefully get about a 30% improvement in the neuropathy pain but it will not be much more than that and  so just wanted to set her expectations.      Relevant Medications   DULoxetine HCl 20 MG CSDR   Other Relevant Orders   B12 and Folate Panel   TSH   COMPLETE METABOLIC PANEL WITH GFR   Magnesium   Vitamin B1   Vitamin B6   Depression, recurrent ([place])    Unfortunately she has been feeling more depressed recently in part mostly because of her diagnosis of metastatic cancer and her inability to go and do the things that she would really like to do.  She does have a lot of support with her daughter with whom she lives and son-in-law.  We discussed options for her neuropathy and started on Cymbalta which may also be helpful for her depression.      Relevant Medications   DULoxetine HCl 20 MG CSDR      Meds ordered this encounter  Medications  . DULoxetine HCl 20 MG CSDR    Sig: Take 20 mg by mouth daily.    Dispense:  30 capsule    Refill:  1    Follow-up: Return in about 3 weeks (around 08/08/2020) for New start medication.   Time spent 25 minutes in encounter.  [name], MD

## 2020-07-22 ENCOUNTER — Encounter: Payer: Self-pay | Admitting: Family Medicine

## 2020-07-22 DIAGNOSIS — M792 Neuralgia and neuritis, unspecified: Secondary | ICD-10-CM | POA: Insufficient documentation

## 2020-07-22 NOTE — Assessment & Plan Note (Signed)
Unfortunately she has been feeling more depressed recently in part mostly because of her diagnosis of metastatic cancer and her inability to go and do the things that she would really like to do.  She does have a lot of support with her daughter with whom she lives and son-in-law.  We discussed options for her neuropathy and started on Cymbalta which may also be helpful for her depression.

## 2020-07-22 NOTE — Assessment & Plan Note (Signed)
We discussed several options.  I did warn that gabapentin can be sedating.  She was originally started on a 300 mg.  So we discussed the option of starting lower may be at 100 mg to see if that could be helpful and much less sedating for her.  We also discussed other FDA approved options such as Cymbalta and Lyrica.  She says she is open to trying duloxetine I think this could also help with some depression symptoms that she is also been experiencing.  Plan to follow-up in 3 to 4 weeks to make sure that she is doing well.  Did discuss that she will hopefully get about a 30% improvement in the neuropathy pain but it will not be much more than that and so just wanted to set her expectations.

## 2020-07-24 DIAGNOSIS — R110 Nausea: Secondary | ICD-10-CM | POA: Diagnosis not present

## 2020-07-24 DIAGNOSIS — E860 Dehydration: Secondary | ICD-10-CM | POA: Diagnosis not present

## 2020-07-24 DIAGNOSIS — R300 Dysuria: Secondary | ICD-10-CM | POA: Diagnosis not present

## 2020-07-24 DIAGNOSIS — R0902 Hypoxemia: Secondary | ICD-10-CM | POA: Diagnosis not present

## 2020-07-24 DIAGNOSIS — R531 Weakness: Secondary | ICD-10-CM | POA: Diagnosis not present

## 2020-07-24 DIAGNOSIS — R42 Dizziness and giddiness: Secondary | ICD-10-CM | POA: Diagnosis not present

## 2020-07-24 DIAGNOSIS — N390 Urinary tract infection, site not specified: Secondary | ICD-10-CM | POA: Diagnosis not present

## 2020-07-24 DIAGNOSIS — R319 Hematuria, unspecified: Secondary | ICD-10-CM | POA: Diagnosis not present

## 2020-07-24 DIAGNOSIS — R9431 Abnormal electrocardiogram [ECG] [EKG]: Secondary | ICD-10-CM | POA: Diagnosis not present

## 2020-07-25 DIAGNOSIS — R9431 Abnormal electrocardiogram [ECG] [EKG]: Secondary | ICD-10-CM | POA: Diagnosis not present

## 2020-07-25 DIAGNOSIS — N135 Crossing vessel and stricture of ureter without hydronephrosis: Secondary | ICD-10-CM | POA: Diagnosis not present

## 2020-07-26 DIAGNOSIS — N1339 Other hydronephrosis: Secondary | ICD-10-CM | POA: Diagnosis not present

## 2020-08-06 DIAGNOSIS — N1339 Other hydronephrosis: Secondary | ICD-10-CM | POA: Diagnosis not present

## 2020-08-06 DIAGNOSIS — N3000 Acute cystitis without hematuria: Secondary | ICD-10-CM | POA: Diagnosis not present

## 2020-08-08 ENCOUNTER — Ambulatory Visit (INDEPENDENT_AMBULATORY_CARE_PROVIDER_SITE_OTHER): Payer: Medicare HMO | Admitting: Family Medicine

## 2020-08-08 ENCOUNTER — Encounter: Payer: Self-pay | Admitting: Family Medicine

## 2020-08-08 ENCOUNTER — Telehealth: Payer: Self-pay | Admitting: Family Medicine

## 2020-08-08 VITALS — BP 112/59 | HR 92 | Ht 65.0 in | Wt 152.0 lb

## 2020-08-08 DIAGNOSIS — R7309 Other abnormal glucose: Secondary | ICD-10-CM | POA: Diagnosis not present

## 2020-08-08 DIAGNOSIS — R5382 Chronic fatigue, unspecified: Secondary | ICD-10-CM

## 2020-08-08 DIAGNOSIS — C541 Malignant neoplasm of endometrium: Secondary | ICD-10-CM

## 2020-08-08 DIAGNOSIS — E871 Hypo-osmolality and hyponatremia: Secondary | ICD-10-CM

## 2020-08-08 DIAGNOSIS — D509 Iron deficiency anemia, unspecified: Secondary | ICD-10-CM

## 2020-08-08 DIAGNOSIS — E876 Hypokalemia: Secondary | ICD-10-CM

## 2020-08-08 DIAGNOSIS — G3281 Cerebellar ataxia in diseases classified elsewhere: Secondary | ICD-10-CM

## 2020-08-08 DIAGNOSIS — I272 Pulmonary hypertension, unspecified: Secondary | ICD-10-CM | POA: Diagnosis not present

## 2020-08-08 DIAGNOSIS — M792 Neuralgia and neuritis, unspecified: Secondary | ICD-10-CM

## 2020-08-08 DIAGNOSIS — F339 Major depressive disorder, recurrent, unspecified: Secondary | ICD-10-CM

## 2020-08-08 DIAGNOSIS — G9332 Myalgic encephalomyelitis/chronic fatigue syndrome: Secondary | ICD-10-CM

## 2020-08-08 DIAGNOSIS — R590 Localized enlarged lymph nodes: Secondary | ICD-10-CM | POA: Insufficient documentation

## 2020-08-08 DIAGNOSIS — R599 Enlarged lymph nodes, unspecified: Secondary | ICD-10-CM

## 2020-08-08 DIAGNOSIS — I700 Atherosclerosis of aorta: Secondary | ICD-10-CM

## 2020-08-08 LAB — POCT GLYCOSYLATED HEMOGLOBIN (HGB A1C): Hemoglobin A1C: 5.3 % (ref 4.0–5.6)

## 2020-08-08 LAB — GLUCOSE, POCT (MANUAL RESULT ENTRY): POC Glucose: 140 mg/dl — AB (ref 70–99)

## 2020-08-08 MED ORDER — DULOXETINE HCL 30 MG PO CPEP: 30.0000 mg | ORAL_CAPSULE | Freq: Every day | ORAL | 1 refills | Status: DC

## 2020-08-08 NOTE — Assessment & Plan Note (Signed)
Still feeling down and depressed.  Has not noticed much mood change with a 20 mg Cymbalta.  Again we will see if this improves on increased dose.

## 2020-08-08 NOTE — Assessment & Plan Note (Signed)
We will try increasing the Cymbalta to 30 mg for couple weeks and then if she feels like she needs to go to 2 tabs she can.  Like to try it maximizing the dose before we completely give up on it we can always consider going back to gabapentin but again starting her at a very low dose since she had sedation at 300 mg.

## 2020-08-08 NOTE — Patient Instructions (Signed)
We will increase your duloxetine to 30 mg daily for 2 weeks.  After that, if you are still noticing a fair amount of discomfort in your legs then okay to increase to 2 capsules daily if needed.  If you are actually doing well on just 1 capsule then you can stay at that dose.

## 2020-08-08 NOTE — Assessment & Plan Note (Signed)
Moderate fall risk

## 2020-08-08 NOTE — Assessment & Plan Note (Signed)
I am concerned that lymphadenopathy was found on her CT in June that was not noted on CT in March.  I am going to reach out to radiology to see if we can re-eval the LNs with another modality besides CT.

## 2020-08-08 NOTE — Progress Notes (Signed)
Established Patient Office Visit  Subjective:  Patient ID: Amy Wilson, female    DOB: Jan 20, 1951  Age: 69 y.o. MRN: [id number]  CC:  Chief Complaint  Patient presents with  . Follow-up    CYMBALTA    HPI Amy Wilson presents for 3-week follow-up of new start Cymbalta.   I saw her about 3 weeks ago and we discussed a trial of medication for her peripheral neuropathy which has been worsening as well as her depression.  She had been started on higher dose of gabapentin and felt the sedative effects were too much.  We opted to start with a low-dose of Cymbalta as she is following up today.  She says she really has not noticed much difference in the burning sensation in her legs that go all the way up to her abdomen.  In fact she said that her knee most gave out on her the other day.  He also wanted to follow-up on her recent emergency department visit.  She actually went to the ED at [facility] on August 4 after having had a cystoscopy on July 22.  She was diagnosed with a urinary tract infection.  She was also found to be mildly anemic with a hemoglobin of 10.9 and MCV of 81.  Sodium and potassium were both low.  Sodium was 134 potassium was 3.2.  Her hemoglobin was actually normal at 13.1 about 5 months ago that was last CBC I can find on file.  She denies any blood in the urine or stool.  Past Medical History:  Diagnosis Date  . Cancer [facility])    uterine    Past Surgical History:  Procedure Laterality Date  . ABDOMINAL HYSTERECTOMY    . kidney stents    . KNEE ARTHROSCOPY Right     Family History  Problem Relation Age of Onset  . Heart disease Father   . Colon cancer Maternal Grandmother   . Stomach cancer Maternal Grandmother     Social History   Socioeconomic History  . Marital status: Divorced    Spouse name: Not on file  . Number of children: 2  . Years of education: 102  . Highest education level: 12th grade  Occupational History  . Occupation: retail     Comment: retired  Tobacco Use  . Smoking status: Never Smoker  . Smokeless tobacco: Never Used  Vaping Use  . Vaping Use: Never used  Substance and Sexual Activity  . Alcohol use: Never  . Drug use: No  . Sexual activity: Never  Other Topics Concern  . Not on file  Social History Narrative   Patient has cancer and doing chemo and radiation. Stays tired all the time   Social Determinants of Health   Financial Resource Strain:   . Difficulty of Paying Living Expenses: Not on file  Food Insecurity:   . Worried About Charity fundraiser in the Last Year: Not on file  . Ran Out of Food in the Last Year: Not on file  Transportation Needs:   . Lack of Transportation (Medical): Not on file  . Lack of Transportation (Non-Medical): Not on file  Physical Activity:   . Days of Exercise per Week: Not on file  . Minutes of Exercise per Session: Not on file  Stress:   . Feeling of Stress : Not on file  Social Connections:   . Frequency of Communication with Friends and Family: Not on file  . Frequency of Social Gatherings  with Friends and Family: Not on file  . Attends Religious Services: Not on file  . Active Member of Clubs or Organizations: Not on file  . Attends Archivist Meetings: Not on file  . Marital Status: Not on file  Intimate Partner Violence:   . Fear of Current or Ex-Partner: Not on file  . Emotionally Abused: Not on file  . Physically Abused: Not on file  . Sexually Abused: Not on file    Outpatient Medications Prior to Visit  Medication Sig Dispense Refill  . calcium carbonate (OS-CAL - DOSED IN MG OF ELEMENTAL CALCIUM) 1250 (500 Ca) MG tablet Take 1 tablet by mouth daily.    . cholecalciferol (VITAMIN D) 25 MCG (1000 UT) tablet Take 1 tablet by mouth daily.    Marland Kitchen esomeprazole (NEXIUM) 20 MG capsule Take 10 mg by mouth daily before breakfast. Take 10 mg 30 minutes before breakfast    . DULoxetine HCl 20 MG CSDR Take 20 mg by mouth daily. 30 capsule 1    No facility-administered medications prior to visit.    Allergies  Allergen Reactions  . Latex Itching and Rash    ROS Review of Systems    Objective:    Physical Exam Constitutional:      Appearance: She is well-developed.  HENT:     Head: Normocephalic and atraumatic.  Cardiovascular:     Rate and Rhythm: Normal rate and regular rhythm.     Heart sounds: Normal heart sounds.  Pulmonary:     Effort: Pulmonary effort is normal.     Breath sounds: Normal breath sounds.  Skin:    General: Skin is warm and dry.  Neurological:     Mental Status: She is alert and oriented to person, place, and time.  Psychiatric:        Behavior: Behavior normal.     BP (!) 112/59   Pulse 92   Ht 5\' 5"  (1.651 m)   Wt 152 lb (68.9 kg)   SpO2 100%   BMI 25.29 kg/m  Wt Readings from Last 3 Encounters:  08/08/20 152 lb (68.9 kg)  07/18/20 158 lb (71.7 kg)  03/01/20 167 lb (75.8 kg)     Health Maintenance Due  Topic Date Due  . MAMMOGRAM  03/04/2019  . INFLUENZA VACCINE  07/21/2020    There are no preventive care reminders to display for this patient.  Lab Results  Component Value Date   TSH 3.70 03/05/2020   Lab Results  Component Value Date   WBC 7.2 03/05/2020   HGB 13.1 03/05/2020   HCT 39.8 03/05/2020   MCV 88.6 03/05/2020   PLT 158 03/05/2020   Lab Results  Component Value Date   NA 138 03/05/2020   K 4.0 03/05/2020   CO2 26 03/05/2020   GLUCOSE 94 03/05/2020   BUN 17 03/05/2020   CREATININE 0.97 (H) 03/05/2020   BILITOT 0.5 03/05/2020   ALKPHOS 81 02/19/2017   AST 16 03/05/2020   ALT 14 03/05/2020   PROT 7.2 03/05/2020   ALBUMIN 4.2 02/19/2017   CALCIUM 9.3 03/05/2020   Lab Results  Component Value Date   CHOL 187 03/05/2020   Lab Results  Component Value Date   HDL 29 (L) 03/05/2020   Lab Results  Component Value Date   LDLCALC 130 (H) 03/05/2020   Lab Results  Component Value Date   TRIG 166 (H) 03/05/2020   Lab Results  Component  Value Date   CHOLHDL 6.4 (H)  03/05/2020   Lab Results  Component Value Date   HGBA1C 5.3 08/08/2020      Assessment & Plan:   Problem List Items Addressed This Visit      Cardiovascular and Mediastinum   Moderate to severe pulmonary hypertension ([place])   Aortic atherosclerosis (HCC)    Stable. Asymptomatic.         Nervous and Auditory   FATIGUE/MALAISE   Relevant Orders   POCT glycosylated hemoglobin (Hb A1C) (Completed)   POCT glucose (manual entry) (Completed)   BASIC METABOLIC PANEL WITH GFR   CBC   Ferritin   B12 and Folate Panel   Cerebellar ataxia in diseases classified elsewhere (HCC)    Moderate fall risk.         Immune and Lymphatic   Retroperitoneal lymphadenopathy    I am concerned that lymphadenopathy was found on her CT in June that was not noted on CT in March.  I am going to reach out to radiology to see if we can re-eval the LNs with another modality besides CT.          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 New onset anemia. Will work up further.         Other   Peripheral neuropathic pain - Primary    We will try increasing the Cymbalta to 30 mg for couple weeks and then if she feels like she needs to go to 2 tabs she can.  Like to try it maximizing the dose before we completely give up on it we can always consider going back to gabapentin but again starting her at a very low dose since she had sedation at 300 mg.      Relevant Orders   BASIC METABOLIC PANEL WITH GFR   CBC   Ferritin   B12 and Folate Panel   Depression, recurrent ([place])    Still feeling down and depressed.  Has not noticed much mood change with a 20 mg Cymbalta.  Again we will see if this improves on increased dose.      Relevant Medications   DULoxetine (CYMBALTA) 30 MG capsule    Other Visit Diagnoses    Abnormal glucose       Relevant Orders   BASIC METABOLIC PANEL WITH GFR   CBC   Ferritin   B12 and Folate Panel   Hypokalemia        Relevant Orders   BASIC METABOLIC PANEL WITH GFR   Hyponatremia       Relevant Orders   BASIC METABOLIC PANEL WITH GFR   Microcytic anemia       Relevant Orders   BASIC METABOLIC PANEL WITH GFR   CBC   Ferritin   B12 and Folate Panel     Did discuss the importance of getting vaccine for Covid.  Really encouraged her to think about it.  Abnormal electrolytes including sodium, and potassium recommend recheck.  New onset anemia, borderline microcytic-we will check for B12 iron etc. clear etiology she denies any blood in her urine or stool.  And to recheck this in a couple of weeks to make sure it is not trending downward.  Meds ordered this encounter  Medications  . DULoxetine (CYMBALTA) 30 MG capsule    Sig: Take 1 capsule (30 mg total) by mouth daily. After 2 weeks ok to increase to 2 caps daily if needed.    Dispense:  60 capsule    Refill:  1  Follow-up: Return in about 5 weeks (around 09/12/2020) for recheck medication/Cymbalta  - ok for virtual if would like .   Time spent in encounter including review notes 71 min  [name], MD

## 2020-08-08 NOTE — Telephone Encounter (Signed)
Can you get me the phone number for the reading room at [facility] to talk to provider about her CT tht was done in June

## 2020-08-08 NOTE — Telephone Encounter (Signed)
Please call patient and let her know that I did look through the hospital notes and she does need some repeat blood work.  I did put the order in.  Just encouraged her to go sometime next week.

## 2020-08-08 NOTE — Assessment & Plan Note (Signed)
Stable. Asymptomatic.

## 2020-08-08 NOTE — Assessment & Plan Note (Signed)
Metastatic. New onset anemia. Will work up further.

## 2020-08-09 NOTE — Telephone Encounter (Signed)
[phone] °

## 2020-08-14 NOTE — Telephone Encounter (Signed)
Called and advised pt of recommendations. No further questions. ?

## 2020-08-23 DIAGNOSIS — N1330 Unspecified hydronephrosis: Secondary | ICD-10-CM | POA: Diagnosis not present

## 2020-08-23 DIAGNOSIS — K828 Other specified diseases of gallbladder: Secondary | ICD-10-CM | POA: Diagnosis not present

## 2020-08-23 DIAGNOSIS — R599 Enlarged lymph nodes, unspecified: Secondary | ICD-10-CM | POA: Diagnosis not present

## 2020-08-23 DIAGNOSIS — N261 Atrophy of kidney (terminal): Secondary | ICD-10-CM | POA: Diagnosis not present

## 2020-08-23 DIAGNOSIS — K8020 Calculus of gallbladder without cholecystitis without obstruction: Secondary | ICD-10-CM | POA: Diagnosis not present

## 2020-08-23 DIAGNOSIS — C541 Malignant neoplasm of endometrium: Secondary | ICD-10-CM | POA: Diagnosis not present

## 2020-08-23 NOTE — Telephone Encounter (Signed)
Called and spoke with the radiologist over at [facility] who pulled up the CT abdomen pelvis from June for me and reviewed it with me.  Unfortunately several of the lymph nodes are greater than a centimeter approximately a centimeter and a half and some them actually have a necrotic center which is a little concerning for metastatic process.  Based on the radiology report from March which was done at [facility] of [facility] there was no report of lymphadenopathy.  Unfortunately he is not able to pull the images directly to see if some of these were possibly there before.  Discussed getting an updated CT.  We will place this at [facility] since her oncologist is at [facility] so that he can actually evaluate the images.  Will notify Dr. Freddrick March of getting an updated scan.  Also called and spoke to [place] today to let her know the plan also reminded her that she needs to go for her blood work she was actually supposed to go this week for that.  [name], MD

## 2020-08-27 ENCOUNTER — Encounter: Payer: Self-pay | Admitting: Family Medicine

## 2020-08-27 ENCOUNTER — Telehealth: Payer: Self-pay | Admitting: Family Medicine

## 2020-08-27 NOTE — Telephone Encounter (Signed)
Please call Dr. Freddrick March and make him aware of CT scan that was performed.  We ordered it in their health system so that he could actually get the images himself.  It looks like she has some enlarging perineal lymph nodes which is concerning.  IMPRESSION:  1. Enlarging retroperitoneal adenopathy.  2. Removal of the bilateral percutaneous nephrostomy tubes. Bilateral ureteral stents with persistent moderate left hydroureteronephrosis and peri urothelial stranding. Suggesting UTI.  3. Regular right urothelial thickening, may be chronic or due to underlying lesion.  4. Hiatal hernia.  5. Constipation.

## 2020-08-30 NOTE — Telephone Encounter (Signed)
LMOM with Dr. Abigail Miyamoto nurse at 12:45 today.   .cmname

## 2020-08-30 NOTE — Telephone Encounter (Signed)
Left message with Dr Inocente Salles office advising of message from Dr Madilyn Fireman and a return call.    Dr. Lucia Bitter, [facility] [address] [place], [place], [place], [address] [phone]

## 2020-08-30 NOTE — Telephone Encounter (Signed)
Dr Inocente Salles office called back and they are in the process of ordering a PET scan.

## 2020-09-12 ENCOUNTER — Telehealth (INDEPENDENT_AMBULATORY_CARE_PROVIDER_SITE_OTHER): Payer: Medicare HMO | Admitting: Family Medicine

## 2020-09-12 ENCOUNTER — Other Ambulatory Visit: Payer: Self-pay

## 2020-09-12 ENCOUNTER — Encounter: Payer: Self-pay | Admitting: Family Medicine

## 2020-09-12 DIAGNOSIS — E860 Dehydration: Secondary | ICD-10-CM

## 2020-09-12 DIAGNOSIS — M792 Neuralgia and neuritis, unspecified: Secondary | ICD-10-CM

## 2020-09-12 DIAGNOSIS — R112 Nausea with vomiting, unspecified: Secondary | ICD-10-CM

## 2020-09-12 DIAGNOSIS — R110 Nausea: Secondary | ICD-10-CM | POA: Diagnosis not present

## 2020-09-12 DIAGNOSIS — K5900 Constipation, unspecified: Secondary | ICD-10-CM

## 2020-09-12 NOTE — Progress Notes (Signed)
LVM informing pt that I was calling to do her prescreening prior to her appointment

## 2020-09-12 NOTE — Assessment & Plan Note (Addendum)
We discussed tapering back off of the Cymbalta. Encouraged to go down to 1 tab daily for 1 week and then stop. Will go ahead and remove it from her medication list. Since she has several things coming up including the PET scan etc. we will get a hold off on any additional trial treatment. She would actually be a great candidate to get ABIs especially complaining of cold feet to make sure that she has no sign of peripheral vascular disease.

## 2020-09-12 NOTE — Progress Notes (Signed)
Virtual Visit via Video Note  I connected with Amy Wilson on 09/12/20 at  8:50 AM EDT by a video enabled telemedicine application and verified that I am speaking with the correct person using two identifiers.   I discussed the limitations of evaluation and management by telemedicine and the availability of in person appointments. The patient expressed understanding and agreed to proceed.  Patient location: at home Provider location: in office Son-in-law Joe present for visit.   Subjective:    CC: Constipation  HPI: Since Tuesday she is really been struggling with feeling constipated nauseated and dehydrated. She says if she tries to drink too much fluid she just vomits it back up. She has been trying to take MiraLAX daily but has not really moved her bowels very much so just very hard stools in the rectum. He says her oncologist did call in some Zofran for her to use. She was actually supposed to get a PET scan to follow-up on some recently found lymphadenopathy but it has been rescheduled for next Tuesday because she was just feeling so bad.  F/U Neuropathic pain -we had recently started her on duloxetine 30 mg but she felt like it really was not helping with the pain in her legs. She is also getting persistent numbness and a cold sensation in her feet. We decided to go up on the Cymbalta to 60 mg but she feels like it still has not really been helpful though she is not had any negative side effects. Except for maybe the constipation.    Past medical history, Surgical history, Family history not pertinant except as noted below, Social history, Allergies, and medications have been entered into the medical record, reviewed, and corrections made.   Review of Systems: No fevers, chills, night sweats, weight loss, chest pain, or shortness of breath.   Objective:    General: Speaking clearly in complete sentences without any shortness of breath.  Alert and oriented x3.  Normal judgment. No  apparent acute distress.    Impression and Recommendations:    Peripheral neuropathic pain We discussed tapering back off of the Cymbalta. Encouraged to go down to 1 tab daily for 1 week and then stop. Will go ahead and remove it from her medication list. Since she has several things coming up including the PET scan etc. we will get a hold off on any additional trial treatment. She would actually be a great candidate to get ABIs especially complaining of cold feet to make sure that she has no sign of peripheral vascular disease.   Constipation with nausea and vomiting-discussed increasing the MiraLAX to twice a day she may also need to add a rectal suppository or even an enema. Explained that these kits can be found over-the-counter especially if she is having extremely hard stools that she is just unable to pass at this point. The nausea and the vomiting are probably secondary to obstipation. And just encouraged her to stay hydrated with fluids including things like Pedialyte, water, popsicles etc. If it gets to the point where she feels like she is getting dehydrated and cannot keep any fluids down then she may need to go to the emergency department. [name] noted that she had a low-grade temperature of 100.5 last evening but she was under 3 blankets at the time when she is they unwrapped her the temperature came back down. So far temperature has been normal today. She does have Zofran on hand for the nausea and vomiting.  Mild  dehydration-see note above.   Time spent in encounter 21 minutes  I discussed the assessment and treatment plan with the patient. The patient was provided an opportunity to ask questions and all were answered. The patient agreed with the plan and demonstrated an understanding of the instructions.   The patient was advised to call back or seek an in-person evaluation if the symptoms worsen or if the condition fails to improve as anticipated.   [name],  MD

## 2020-09-17 DIAGNOSIS — K449 Diaphragmatic hernia without obstruction or gangrene: Secondary | ICD-10-CM | POA: Diagnosis not present

## 2020-09-17 DIAGNOSIS — R918 Other nonspecific abnormal finding of lung field: Secondary | ICD-10-CM | POA: Diagnosis not present

## 2020-09-17 DIAGNOSIS — Z90710 Acquired absence of both cervix and uterus: Secondary | ICD-10-CM | POA: Diagnosis not present

## 2020-09-17 DIAGNOSIS — R590 Localized enlarged lymph nodes: Secondary | ICD-10-CM | POA: Diagnosis not present

## 2020-09-17 DIAGNOSIS — C549 Malignant neoplasm of corpus uteri, unspecified: Secondary | ICD-10-CM | POA: Diagnosis not present

## 2020-09-17 DIAGNOSIS — R599 Enlarged lymph nodes, unspecified: Secondary | ICD-10-CM | POA: Diagnosis not present

## 2020-09-18 DIAGNOSIS — Z79899 Other long term (current) drug therapy: Secondary | ICD-10-CM | POA: Diagnosis not present

## 2020-09-18 DIAGNOSIS — R29898 Other symptoms and signs involving the musculoskeletal system: Secondary | ICD-10-CM | POA: Diagnosis not present

## 2020-09-18 DIAGNOSIS — Z95828 Presence of other vascular implants and grafts: Secondary | ICD-10-CM | POA: Diagnosis not present

## 2020-09-18 DIAGNOSIS — C541 Malignant neoplasm of endometrium: Secondary | ICD-10-CM | POA: Diagnosis not present

## 2020-09-25 ENCOUNTER — Other Ambulatory Visit: Payer: Self-pay | Admitting: Family Medicine

## 2020-09-25 DIAGNOSIS — R531 Weakness: Secondary | ICD-10-CM

## 2020-09-26 DIAGNOSIS — Z5111 Encounter for antineoplastic chemotherapy: Secondary | ICD-10-CM | POA: Diagnosis not present

## 2020-09-26 DIAGNOSIS — C770 Secondary and unspecified malignant neoplasm of lymph nodes of head, face and neck: Secondary | ICD-10-CM | POA: Diagnosis not present

## 2020-09-26 DIAGNOSIS — C775 Secondary and unspecified malignant neoplasm of intrapelvic lymph nodes: Secondary | ICD-10-CM | POA: Diagnosis not present

## 2020-09-26 DIAGNOSIS — C772 Secondary and unspecified malignant neoplasm of intra-abdominal lymph nodes: Secondary | ICD-10-CM | POA: Diagnosis not present

## 2020-09-26 DIAGNOSIS — C541 Malignant neoplasm of endometrium: Secondary | ICD-10-CM | POA: Diagnosis not present

## 2020-10-10 DIAGNOSIS — R531 Weakness: Secondary | ICD-10-CM | POA: Diagnosis not present

## 2020-10-10 DIAGNOSIS — R509 Fever, unspecified: Secondary | ICD-10-CM | POA: Diagnosis not present

## 2020-10-10 DIAGNOSIS — Z23 Encounter for immunization: Secondary | ICD-10-CM | POA: Diagnosis not present

## 2020-10-10 DIAGNOSIS — N390 Urinary tract infection, site not specified: Secondary | ICD-10-CM | POA: Diagnosis not present

## 2020-10-10 DIAGNOSIS — Z20822 Contact with and (suspected) exposure to covid-19: Secondary | ICD-10-CM | POA: Diagnosis not present

## 2020-10-10 DIAGNOSIS — D61810 Antineoplastic chemotherapy induced pancytopenia: Secondary | ICD-10-CM | POA: Diagnosis not present

## 2020-10-10 DIAGNOSIS — D631 Anemia in chronic kidney disease: Secondary | ICD-10-CM | POA: Diagnosis not present

## 2020-10-10 DIAGNOSIS — N1832 Chronic kidney disease, stage 3b: Secondary | ICD-10-CM | POA: Diagnosis not present

## 2020-10-10 DIAGNOSIS — N1831 Chronic kidney disease, stage 3a: Secondary | ICD-10-CM | POA: Diagnosis not present

## 2020-10-10 DIAGNOSIS — R29818 Other symptoms and signs involving the nervous system: Secondary | ICD-10-CM | POA: Diagnosis not present

## 2020-10-10 DIAGNOSIS — A419 Sepsis, unspecified organism: Secondary | ICD-10-CM | POA: Diagnosis not present

## 2020-10-10 DIAGNOSIS — E871 Hypo-osmolality and hyponatremia: Secondary | ICD-10-CM | POA: Diagnosis not present

## 2020-10-10 DIAGNOSIS — N3001 Acute cystitis with hematuria: Secondary | ICD-10-CM | POA: Diagnosis not present

## 2020-10-10 DIAGNOSIS — K8020 Calculus of gallbladder without cholecystitis without obstruction: Secondary | ICD-10-CM | POA: Diagnosis not present

## 2020-10-10 DIAGNOSIS — R1111 Vomiting without nausea: Secondary | ICD-10-CM | POA: Diagnosis not present

## 2020-10-10 DIAGNOSIS — T451X5A Adverse effect of antineoplastic and immunosuppressive drugs, initial encounter: Secondary | ICD-10-CM | POA: Diagnosis not present

## 2020-10-10 DIAGNOSIS — R918 Other nonspecific abnormal finding of lung field: Secondary | ICD-10-CM | POA: Diagnosis not present

## 2020-10-10 DIAGNOSIS — Z466 Encounter for fitting and adjustment of urinary device: Secondary | ICD-10-CM | POA: Diagnosis not present

## 2020-10-10 DIAGNOSIS — R4182 Altered mental status, unspecified: Secondary | ICD-10-CM | POA: Diagnosis not present

## 2020-10-10 DIAGNOSIS — S32000A Wedge compression fracture of unspecified lumbar vertebra, initial encounter for closed fracture: Secondary | ICD-10-CM | POA: Diagnosis not present

## 2020-10-10 DIAGNOSIS — E876 Hypokalemia: Secondary | ICD-10-CM | POA: Diagnosis not present

## 2020-10-10 DIAGNOSIS — C541 Malignant neoplasm of endometrium: Secondary | ICD-10-CM | POA: Diagnosis not present

## 2020-10-10 DIAGNOSIS — R404 Transient alteration of awareness: Secondary | ICD-10-CM | POA: Diagnosis not present

## 2020-10-10 DIAGNOSIS — C775 Secondary and unspecified malignant neoplasm of intrapelvic lymph nodes: Secondary | ICD-10-CM | POA: Diagnosis not present

## 2020-10-10 DIAGNOSIS — G620 Drug-induced polyneuropathy: Secondary | ICD-10-CM | POA: Diagnosis not present

## 2020-10-10 DIAGNOSIS — N179 Acute kidney failure, unspecified: Secondary | ICD-10-CM | POA: Diagnosis not present

## 2020-10-10 DIAGNOSIS — N135 Crossing vessel and stricture of ureter without hydronephrosis: Secondary | ICD-10-CM | POA: Diagnosis not present

## 2020-10-10 DIAGNOSIS — R000 Tachycardia, unspecified: Secondary | ICD-10-CM | POA: Diagnosis not present

## 2020-10-11 DIAGNOSIS — N179 Acute kidney failure, unspecified: Secondary | ICD-10-CM | POA: Diagnosis not present

## 2020-10-11 DIAGNOSIS — N1832 Chronic kidney disease, stage 3b: Secondary | ICD-10-CM | POA: Diagnosis not present

## 2020-10-11 DIAGNOSIS — D631 Anemia in chronic kidney disease: Secondary | ICD-10-CM | POA: Diagnosis not present

## 2020-10-11 DIAGNOSIS — C775 Secondary and unspecified malignant neoplasm of intrapelvic lymph nodes: Secondary | ICD-10-CM | POA: Diagnosis not present

## 2020-10-11 DIAGNOSIS — A419 Sepsis, unspecified organism: Secondary | ICD-10-CM | POA: Diagnosis not present

## 2020-10-11 DIAGNOSIS — N390 Urinary tract infection, site not specified: Secondary | ICD-10-CM | POA: Diagnosis not present

## 2020-10-11 DIAGNOSIS — C541 Malignant neoplasm of endometrium: Secondary | ICD-10-CM | POA: Diagnosis not present

## 2020-10-12 DIAGNOSIS — N1831 Chronic kidney disease, stage 3a: Secondary | ICD-10-CM | POA: Diagnosis not present

## 2020-10-12 DIAGNOSIS — N1832 Chronic kidney disease, stage 3b: Secondary | ICD-10-CM | POA: Diagnosis not present

## 2020-10-12 DIAGNOSIS — E871 Hypo-osmolality and hyponatremia: Secondary | ICD-10-CM | POA: Diagnosis not present

## 2020-10-12 DIAGNOSIS — N179 Acute kidney failure, unspecified: Secondary | ICD-10-CM | POA: Diagnosis not present

## 2020-10-12 DIAGNOSIS — E876 Hypokalemia: Secondary | ICD-10-CM | POA: Diagnosis not present

## 2020-10-12 DIAGNOSIS — C775 Secondary and unspecified malignant neoplasm of intrapelvic lymph nodes: Secondary | ICD-10-CM | POA: Diagnosis not present

## 2020-10-12 DIAGNOSIS — A419 Sepsis, unspecified organism: Secondary | ICD-10-CM | POA: Diagnosis not present

## 2020-10-12 DIAGNOSIS — N390 Urinary tract infection, site not specified: Secondary | ICD-10-CM | POA: Diagnosis not present

## 2020-10-12 DIAGNOSIS — C541 Malignant neoplasm of endometrium: Secondary | ICD-10-CM | POA: Diagnosis not present

## 2020-10-13 DIAGNOSIS — C541 Malignant neoplasm of endometrium: Secondary | ICD-10-CM | POA: Diagnosis not present

## 2020-10-13 DIAGNOSIS — E871 Hypo-osmolality and hyponatremia: Secondary | ICD-10-CM | POA: Diagnosis not present

## 2020-10-13 DIAGNOSIS — A419 Sepsis, unspecified organism: Secondary | ICD-10-CM | POA: Diagnosis not present

## 2020-10-13 DIAGNOSIS — D61810 Antineoplastic chemotherapy induced pancytopenia: Secondary | ICD-10-CM | POA: Diagnosis not present

## 2020-10-13 DIAGNOSIS — N135 Crossing vessel and stricture of ureter without hydronephrosis: Secondary | ICD-10-CM | POA: Diagnosis not present

## 2020-10-13 DIAGNOSIS — G620 Drug-induced polyneuropathy: Secondary | ICD-10-CM | POA: Diagnosis not present

## 2020-10-13 DIAGNOSIS — C775 Secondary and unspecified malignant neoplasm of intrapelvic lymph nodes: Secondary | ICD-10-CM | POA: Diagnosis not present

## 2020-10-13 DIAGNOSIS — N390 Urinary tract infection, site not specified: Secondary | ICD-10-CM | POA: Diagnosis not present

## 2020-10-13 DIAGNOSIS — T451X5A Adverse effect of antineoplastic and immunosuppressive drugs, initial encounter: Secondary | ICD-10-CM | POA: Insufficient documentation

## 2020-10-14 DIAGNOSIS — C775 Secondary and unspecified malignant neoplasm of intrapelvic lymph nodes: Secondary | ICD-10-CM | POA: Diagnosis not present

## 2020-10-14 DIAGNOSIS — N390 Urinary tract infection, site not specified: Secondary | ICD-10-CM | POA: Diagnosis not present

## 2020-10-14 DIAGNOSIS — C541 Malignant neoplasm of endometrium: Secondary | ICD-10-CM | POA: Diagnosis not present

## 2020-10-14 DIAGNOSIS — N135 Crossing vessel and stricture of ureter without hydronephrosis: Secondary | ICD-10-CM | POA: Diagnosis not present

## 2020-10-14 DIAGNOSIS — D61810 Antineoplastic chemotherapy induced pancytopenia: Secondary | ICD-10-CM | POA: Diagnosis not present

## 2020-10-14 DIAGNOSIS — E871 Hypo-osmolality and hyponatremia: Secondary | ICD-10-CM | POA: Diagnosis not present

## 2020-10-14 DIAGNOSIS — G620 Drug-induced polyneuropathy: Secondary | ICD-10-CM | POA: Diagnosis not present

## 2020-10-14 DIAGNOSIS — A419 Sepsis, unspecified organism: Secondary | ICD-10-CM | POA: Diagnosis not present

## 2020-10-15 DIAGNOSIS — G620 Drug-induced polyneuropathy: Secondary | ICD-10-CM | POA: Diagnosis not present

## 2020-10-15 DIAGNOSIS — A419 Sepsis, unspecified organism: Secondary | ICD-10-CM | POA: Diagnosis not present

## 2020-10-15 DIAGNOSIS — C775 Secondary and unspecified malignant neoplasm of intrapelvic lymph nodes: Secondary | ICD-10-CM | POA: Diagnosis not present

## 2020-10-15 DIAGNOSIS — E871 Hypo-osmolality and hyponatremia: Secondary | ICD-10-CM | POA: Diagnosis not present

## 2020-10-15 DIAGNOSIS — N135 Crossing vessel and stricture of ureter without hydronephrosis: Secondary | ICD-10-CM | POA: Diagnosis not present

## 2020-10-15 DIAGNOSIS — C541 Malignant neoplasm of endometrium: Secondary | ICD-10-CM | POA: Diagnosis not present

## 2020-10-15 DIAGNOSIS — D61810 Antineoplastic chemotherapy induced pancytopenia: Secondary | ICD-10-CM | POA: Diagnosis not present

## 2020-10-15 DIAGNOSIS — N390 Urinary tract infection, site not specified: Secondary | ICD-10-CM | POA: Diagnosis not present

## 2020-10-15 LAB — COLOGUARD

## 2020-10-16 DIAGNOSIS — C541 Malignant neoplasm of endometrium: Secondary | ICD-10-CM | POA: Diagnosis not present

## 2020-10-16 DIAGNOSIS — S32000A Wedge compression fracture of unspecified lumbar vertebra, initial encounter for closed fracture: Secondary | ICD-10-CM | POA: Diagnosis not present

## 2020-10-16 DIAGNOSIS — G620 Drug-induced polyneuropathy: Secondary | ICD-10-CM | POA: Diagnosis not present

## 2020-10-16 DIAGNOSIS — D61810 Antineoplastic chemotherapy induced pancytopenia: Secondary | ICD-10-CM | POA: Diagnosis not present

## 2020-10-16 DIAGNOSIS — N135 Crossing vessel and stricture of ureter without hydronephrosis: Secondary | ICD-10-CM | POA: Diagnosis not present

## 2020-10-16 DIAGNOSIS — C775 Secondary and unspecified malignant neoplasm of intrapelvic lymph nodes: Secondary | ICD-10-CM | POA: Diagnosis not present

## 2020-10-16 DIAGNOSIS — T451X5A Adverse effect of antineoplastic and immunosuppressive drugs, initial encounter: Secondary | ICD-10-CM | POA: Diagnosis not present

## 2020-10-17 ENCOUNTER — Other Ambulatory Visit: Payer: Self-pay

## 2020-10-17 NOTE — Patient Outreach (Signed)
[facility]) Care Management  10/17/2020  Amy Wilson 1951-11-29 [id number]     Transition of Care Referral  Referral Date: 10/17/2020 Referral Source: [facility] Discharge Report Date of Discharge: 10/16/2020 Facility: [place]: [facility]    Outreach attempt # 1 to patient. Spoke with daughter-Shannon with permission from patient. She denies any acute issues or concerns at present. She report that  Her and her spouse are providing 24 hr care to patient. Patient has complained of no pain/discomfort. Their biggest issues is transferring patient from bed to [facility] due to weakness and requiring one person assist. RN CM provided some recommendations to assist. Patient also has female urinal to sue as well. Daughter confirms that they have all med in the home and no issues regarding them. She has to call and make MD follow up appt and voices no issues with transportation at this time. Patient getting [place] services. She denies any RN CM needs or concerns at this time. She was provided with contact info to call if needed.     Plan: RN CM will close case at this time.    Enzo Montgomery, RN,BSN,CCM [place] Telephonic Care Management Coordinator Direct Phone: [phone] Toll Free: [phone] Fax: [phone]

## 2020-10-18 DIAGNOSIS — G620 Drug-induced polyneuropathy: Secondary | ICD-10-CM | POA: Diagnosis not present

## 2020-10-18 DIAGNOSIS — C775 Secondary and unspecified malignant neoplasm of intrapelvic lymph nodes: Secondary | ICD-10-CM | POA: Diagnosis not present

## 2020-10-18 DIAGNOSIS — N1832 Chronic kidney disease, stage 3b: Secondary | ICD-10-CM | POA: Diagnosis not present

## 2020-10-18 DIAGNOSIS — N390 Urinary tract infection, site not specified: Secondary | ICD-10-CM | POA: Diagnosis not present

## 2020-10-18 DIAGNOSIS — D61810 Antineoplastic chemotherapy induced pancytopenia: Secondary | ICD-10-CM | POA: Diagnosis not present

## 2020-10-18 DIAGNOSIS — I129 Hypertensive chronic kidney disease with stage 1 through stage 4 chronic kidney disease, or unspecified chronic kidney disease: Secondary | ICD-10-CM | POA: Diagnosis not present

## 2020-10-18 DIAGNOSIS — T451X5D Adverse effect of antineoplastic and immunosuppressive drugs, subsequent encounter: Secondary | ICD-10-CM | POA: Diagnosis not present

## 2020-10-18 DIAGNOSIS — C541 Malignant neoplasm of endometrium: Secondary | ICD-10-CM | POA: Diagnosis not present

## 2020-10-18 DIAGNOSIS — D631 Anemia in chronic kidney disease: Secondary | ICD-10-CM | POA: Diagnosis not present

## 2020-10-21 DIAGNOSIS — N390 Urinary tract infection, site not specified: Secondary | ICD-10-CM | POA: Diagnosis not present

## 2020-10-21 DIAGNOSIS — D631 Anemia in chronic kidney disease: Secondary | ICD-10-CM | POA: Diagnosis not present

## 2020-10-21 DIAGNOSIS — I129 Hypertensive chronic kidney disease with stage 1 through stage 4 chronic kidney disease, or unspecified chronic kidney disease: Secondary | ICD-10-CM | POA: Diagnosis not present

## 2020-10-21 DIAGNOSIS — G620 Drug-induced polyneuropathy: Secondary | ICD-10-CM | POA: Diagnosis not present

## 2020-10-21 DIAGNOSIS — N1832 Chronic kidney disease, stage 3b: Secondary | ICD-10-CM | POA: Diagnosis not present

## 2020-10-21 DIAGNOSIS — C541 Malignant neoplasm of endometrium: Secondary | ICD-10-CM | POA: Diagnosis not present

## 2020-10-21 DIAGNOSIS — D61810 Antineoplastic chemotherapy induced pancytopenia: Secondary | ICD-10-CM | POA: Diagnosis not present

## 2020-10-21 DIAGNOSIS — T451X5D Adverse effect of antineoplastic and immunosuppressive drugs, subsequent encounter: Secondary | ICD-10-CM | POA: Diagnosis not present

## 2020-10-21 DIAGNOSIS — C775 Secondary and unspecified malignant neoplasm of intrapelvic lymph nodes: Secondary | ICD-10-CM | POA: Diagnosis not present

## 2020-10-22 ENCOUNTER — Encounter: Payer: Self-pay | Admitting: Family Medicine

## 2020-10-22 ENCOUNTER — Ambulatory Visit (INDEPENDENT_AMBULATORY_CARE_PROVIDER_SITE_OTHER): Payer: Medicare HMO | Admitting: Family Medicine

## 2020-10-22 VITALS — BP 110/68 | HR 98

## 2020-10-22 DIAGNOSIS — N1832 Chronic kidney disease, stage 3b: Secondary | ICD-10-CM

## 2020-10-22 DIAGNOSIS — R531 Weakness: Secondary | ICD-10-CM

## 2020-10-22 DIAGNOSIS — R300 Dysuria: Secondary | ICD-10-CM | POA: Diagnosis not present

## 2020-10-22 DIAGNOSIS — E878 Other disorders of electrolyte and fluid balance, not elsewhere classified: Secondary | ICD-10-CM

## 2020-10-22 DIAGNOSIS — N390 Urinary tract infection, site not specified: Secondary | ICD-10-CM | POA: Diagnosis not present

## 2020-10-22 DIAGNOSIS — R351 Nocturia: Secondary | ICD-10-CM

## 2020-10-22 DIAGNOSIS — N3946 Mixed incontinence: Secondary | ICD-10-CM | POA: Diagnosis not present

## 2020-10-22 DIAGNOSIS — A419 Sepsis, unspecified organism: Secondary | ICD-10-CM | POA: Diagnosis not present

## 2020-10-22 MED ORDER — OXYBUTYNIN CHLORIDE 5 MG PO TABS: 5.0000 mg | ORAL_TABLET | Freq: Two times a day (BID) | ORAL | 1 refills | Status: DC

## 2020-10-22 NOTE — Assessment & Plan Note (Addendum)
Discussed options.  Hopefully as she get stronger and is able to transition out of bed by herself this will be less of an issue she does have a potty chair but just cannot make it in time.  In the short-term we discussed maybe a trial of an overactive bladder medication.  It looks like her insurance will cover oxybutynin twice daily for $3 so recommend trial of this.  If she does not like how she feels on the medication she can stop it immediately or if it is just not helping.  Again it may just buy her some more time to be able to get to the toilet.  I would like to recheck her urine since she still having some dysuria.  Cup sent home she was unable to give a sample here in the office today.  She does have a follow-up with urology next week.

## 2020-10-22 NOTE — Assessment & Plan Note (Signed)
Hopefully will continue to get her strength back with home PT and OT.

## 2020-10-22 NOTE — Assessment & Plan Note (Signed)
Any function back to baseline after hospitalization.

## 2020-10-22 NOTE — Progress Notes (Signed)
Established Patient Office Visit  Subjective:  Patient ID: Amy Wilson, female    DOB: 29-Jul-1951  Age: 69 y.o. MRN: [id number]  CC:  Chief Complaint  Patient presents with  . Hospitalization Follow-up    uti    HPI Amy Wilson presents for hospital follow-up from [place].  She had been disoriented for about a day and a half before going to the ED.  She has a prescription for Ativan to help her rest and sleep when she feels very anxious she rarely takes up.  But she had taken it did the day before and so family members thought maybe it was just a side effect of the medication but by the next day when she was not acting like herself ended up taking her to the emergency department.  Her blood pressure was a little bit low and she had a mild fever.  She was diagnosed with sepsis secondary to UTI.  Blood cultures were negative and urinary cultures were negative though they were not able to be collected until she had already received antibiotics.  She was also found to have low sodium potassium and magnesium and was severely anemic so actually received 2 units of packed red blood cells in the emergency department.  She also had acute renal impairment which did resolve by the time she was discharged home.  She was discharged home with a prescription for Ceftin 500 mg twice a day for 14 days and she is still taking her antibiotic that she is having a little bit of burning with urination since being home.  No fevers or chills.  And she is definitely feeling like herself.  Still very weak and deconditioned and is having difficulty with transitioning which is causing some issues with her urinary urgency and occasional incontinence.  When she is out and about she will wear a depends but when she is home she actually tries not to do so that she is not sitting in her urine she was getting to the point where she would just leave them on and not want to change them.  And they are worried that that may  have been what may have preceded the urinary tract infection.  But she is also having a lot of nocturia at night and again needs help actually getting out of bed.  She is currently getting some home health services to help her build up her strength and work on transitioning again.  They are coming out twice a week on average.  Past Medical History:  Diagnosis Date  . 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 ABDOMINAL HYSTERECTOMY    . kidney stents    . KNEE ARTHROSCOPY Right     Family History  Problem Relation Age of Onset  . Heart disease Father   . Colon cancer Maternal Grandmother   . Stomach cancer Maternal Grandmother     Social History   Socioeconomic History  . Marital status: Divorced    Spouse name: Not on file  . Number of children: 2  . Years of education: 60  . Highest education level: 12th grade  Occupational History  . Occupation: retail    Comment: retired  Tobacco Use  . Smoking status: Never Smoker  . Smokeless tobacco: Never Used  Vaping Use  . Vaping Use: Never used  Substance and Sexual Activity  . Alcohol use: Never  . Drug use: No  .  Sexual activity: Never  Other Topics Concern  . Not on file  Social History Narrative   Patient has cancer and doing chemo and radiation. Stays tired all the time   Social Determinants of Health   Financial Resource Strain:   . Difficulty of Paying Living Expenses: Not on file  Food Insecurity:   . Worried About Charity fundraiser in the Last Year: Not on file  . Ran Out of Food in the Last Year: Not on file  Transportation Needs:   . Lack of Transportation (Medical): Not on file  . Lack of Transportation (Non-Medical): Not on file  Physical Activity:   . Days of Exercise per Week: Not on file  . Minutes of Exercise per Session: Not on file  Stress:   . Feeling of Stress : Not on file  Social Connections:   . Frequency of Communication with Friends and Family:  Not on file  . Frequency of Social Gatherings with Friends and Family: Not on file  . Attends Religious Services: Not on file  . Active Member of Clubs or Organizations: Not on file  . Attends Archivist Meetings: Not on file  . Marital Status: Not on file  Intimate Partner Violence:   . Fear of Current or Ex-Partner: Not on file  . Emotionally Abused: Not on file  . Physically Abused: Not on file  . Sexually Abused: Not on file    Outpatient Medications Prior to Visit  Medication Sig Dispense Refill  . cefUROXime (CEFTIN) 500 MG tablet Take by mouth.    . gabapentin (NEURONTIN) 100 MG capsule Take by mouth.    Marland Kitchen LORazepam (ATIVAN) 1 MG tablet Take by mouth.    . potassium chloride (KLOR-CON) 10 MEQ tablet Take by mouth.    . calcium carbonate (OS-CAL - DOSED IN MG OF ELEMENTAL CALCIUM) 1250 (500 Ca) MG tablet Take 1 tablet by mouth daily.    . cholecalciferol (VITAMIN D) 25 MCG (1000 UT) tablet Take 1 tablet by mouth daily.    Marland Kitchen esomeprazole (NEXIUM) 20 MG capsule Take 10 mg by mouth daily before breakfast. Take 10 mg 30 minutes before breakfast     No facility-administered medications prior to visit.    Allergies  Allergen Reactions  . Latex Itching and Rash    ROS Review of Systems    Objective:    Physical Exam Constitutional:      Appearance: She is well-developed.  HENT:     Head: Normocephalic and atraumatic.  Cardiovascular:     Rate and Rhythm: Normal rate and regular rhythm.     Heart sounds: Normal heart sounds.  Pulmonary:     Effort: Pulmonary effort is normal.     Breath sounds: Normal breath sounds.  Skin:    General: Skin is warm and dry.  Neurological:     Mental Status: She is alert and oriented to person, place, and time.  Psychiatric:        Behavior: Behavior normal.     BP 110/68   Pulse 98   SpO2 100%  Wt Readings from Last 3 Encounters:  08/08/20 152 lb (68.9 kg)  07/18/20 158 lb (71.7 kg)  03/01/20 167 lb (75.8 kg)      Health Maintenance Due  Topic Date Due  . MAMMOGRAM  03/04/2019    There are no preventive care reminders to display for this patient.  Lab Results  Component Value Date   TSH 3.70 03/05/2020   Lab  Results  Component Value Date   WBC 7.2 03/05/2020   HGB 13.1 03/05/2020   HCT 39.8 03/05/2020   MCV 88.6 03/05/2020   PLT 158 03/05/2020   Lab Results  Component Value Date   NA 138 03/05/2020   K 4.0 03/05/2020   CO2 26 03/05/2020   GLUCOSE 94 03/05/2020   BUN 17 03/05/2020   CREATININE 0.97 (H) 03/05/2020   BILITOT 0.5 03/05/2020   ALKPHOS 81 02/19/2017   AST 16 03/05/2020   ALT 14 03/05/2020   PROT 7.2 03/05/2020   ALBUMIN 4.2 02/19/2017   CALCIUM 9.3 03/05/2020   Lab Results  Component Value Date   CHOL 187 03/05/2020   Lab Results  Component Value Date   HDL 29 (L) 03/05/2020   Lab Results  Component Value Date   LDLCALC 130 (H) 03/05/2020   Lab Results  Component Value Date   TRIG 166 (H) 03/05/2020   Lab Results  Component Value Date   CHOLHDL 6.4 (H) 03/05/2020   Lab Results  Component Value Date   HGBA1C 5.3 08/08/2020      Assessment & Plan:   Problem List Items Addressed This Visit      Genitourinary   Stage 3b chronic kidney disease ([place])    Any function back to baseline after hospitalization.        Other   Sepsis secondary to UTI [facility]) - Primary    Being much better.  Back to baseline.  Completing her antibiotics.  With urology next week.      Relevant Medications   cefUROXime (CEFTIN) 500 MG tablet   Mixed stress and urge urinary incontinence    Discussed options.  Hopefully as she get stronger and is able to transition out of bed by herself this will be less of an issue she does have a potty chair but just cannot make it in time.  In the short-term we discussed maybe a trial of an overactive bladder medication.  It looks like her insurance will cover oxybutynin twice daily for $3 so recommend trial of this.  If she does  not like how she feels on the medication she can stop it immediately or if it is just not helping.  Again it may just buy her some more time to be able to get to the toilet.  I would like to recheck her urine since she still having some dysuria.  Cup sent home she was unable to give a sample here in the office today.  She does have a follow-up with urology next week.      Relevant Medications   oxybutynin (DITROPAN) 5 MG tablet   Generalized weakness    Hopefully will continue to get her strength back with home PT and OT.         Other Visit Diagnoses    Nocturia       Relevant Medications   oxybutynin (DITROPAN) 5 MG tablet   Electrolyte abnormality       Dysuria          Electrolyte disturbances seen in the ED did resolve by the time she was discharged so I did not repeat labs today.  Meds ordered this encounter  Medications  . oxybutynin (DITROPAN) 5 MG tablet    Sig: Take 1 tablet (5 mg total) by mouth 2 (two) times daily.    Dispense:  60 tablet    Refill:  1    Follow-up: Return if symptoms worsen or fail to improve.  I spent 35 minutes on the day of the encounter to include pre-visit record review, face-to-face time with the patient and post visit ordering of test.    [name], MD

## 2020-10-22 NOTE — Assessment & Plan Note (Signed)
Being much better.  Back to baseline.  Completing her antibiotics.  With urology next week.

## 2020-10-23 DIAGNOSIS — G620 Drug-induced polyneuropathy: Secondary | ICD-10-CM | POA: Diagnosis not present

## 2020-10-23 DIAGNOSIS — C541 Malignant neoplasm of endometrium: Secondary | ICD-10-CM | POA: Diagnosis not present

## 2020-10-23 DIAGNOSIS — D61810 Antineoplastic chemotherapy induced pancytopenia: Secondary | ICD-10-CM | POA: Diagnosis not present

## 2020-10-23 DIAGNOSIS — T451X5D Adverse effect of antineoplastic and immunosuppressive drugs, subsequent encounter: Secondary | ICD-10-CM | POA: Diagnosis not present

## 2020-10-23 DIAGNOSIS — I129 Hypertensive chronic kidney disease with stage 1 through stage 4 chronic kidney disease, or unspecified chronic kidney disease: Secondary | ICD-10-CM | POA: Diagnosis not present

## 2020-10-23 DIAGNOSIS — N390 Urinary tract infection, site not specified: Secondary | ICD-10-CM | POA: Diagnosis not present

## 2020-10-23 DIAGNOSIS — C775 Secondary and unspecified malignant neoplasm of intrapelvic lymph nodes: Secondary | ICD-10-CM | POA: Diagnosis not present

## 2020-10-23 DIAGNOSIS — D631 Anemia in chronic kidney disease: Secondary | ICD-10-CM | POA: Diagnosis not present

## 2020-10-23 DIAGNOSIS — N1832 Chronic kidney disease, stage 3b: Secondary | ICD-10-CM | POA: Diagnosis not present

## 2020-10-23 LAB — POCT URINALYSIS DIP (CLINITEK)
Bilirubin, UA: NEGATIVE
Blood, UA: NEGATIVE
Glucose, UA: NEGATIVE mg/dL
Ketones, POC UA: NEGATIVE mg/dL
Nitrite, UA: NEGATIVE
POC PROTEIN,UA: NEGATIVE
Spec Grav, UA: 1.02 (ref 1.010–1.025)
Urobilinogen, UA: 0.2 E.U./dL
pH, UA: 7 (ref 5.0–8.0)

## 2020-10-23 NOTE — Addendum Note (Signed)
Addended by: Teddy Spike on: 10/23/2020 04:40 PM   Modules accepted: Orders

## 2020-10-25 DIAGNOSIS — G620 Drug-induced polyneuropathy: Secondary | ICD-10-CM | POA: Diagnosis not present

## 2020-10-25 DIAGNOSIS — C775 Secondary and unspecified malignant neoplasm of intrapelvic lymph nodes: Secondary | ICD-10-CM | POA: Diagnosis not present

## 2020-10-25 DIAGNOSIS — C541 Malignant neoplasm of endometrium: Secondary | ICD-10-CM | POA: Diagnosis not present

## 2020-10-25 DIAGNOSIS — N1832 Chronic kidney disease, stage 3b: Secondary | ICD-10-CM | POA: Diagnosis not present

## 2020-10-25 DIAGNOSIS — D61810 Antineoplastic chemotherapy induced pancytopenia: Secondary | ICD-10-CM | POA: Diagnosis not present

## 2020-10-25 DIAGNOSIS — N390 Urinary tract infection, site not specified: Secondary | ICD-10-CM | POA: Diagnosis not present

## 2020-10-25 DIAGNOSIS — I129 Hypertensive chronic kidney disease with stage 1 through stage 4 chronic kidney disease, or unspecified chronic kidney disease: Secondary | ICD-10-CM | POA: Diagnosis not present

## 2020-10-25 DIAGNOSIS — T451X5D Adverse effect of antineoplastic and immunosuppressive drugs, subsequent encounter: Secondary | ICD-10-CM | POA: Diagnosis not present

## 2020-10-25 DIAGNOSIS — D631 Anemia in chronic kidney disease: Secondary | ICD-10-CM | POA: Diagnosis not present

## 2020-10-28 DIAGNOSIS — N136 Pyonephrosis: Secondary | ICD-10-CM | POA: Diagnosis not present

## 2020-10-28 DIAGNOSIS — N1339 Other hydronephrosis: Secondary | ICD-10-CM | POA: Diagnosis not present

## 2020-10-28 DIAGNOSIS — N3000 Acute cystitis without hematuria: Secondary | ICD-10-CM | POA: Diagnosis not present

## 2020-10-28 DIAGNOSIS — N135 Crossing vessel and stricture of ureter without hydronephrosis: Secondary | ICD-10-CM | POA: Diagnosis not present

## 2020-10-29 DIAGNOSIS — N390 Urinary tract infection, site not specified: Secondary | ICD-10-CM | POA: Diagnosis not present

## 2020-10-29 DIAGNOSIS — D61810 Antineoplastic chemotherapy induced pancytopenia: Secondary | ICD-10-CM | POA: Diagnosis not present

## 2020-10-29 DIAGNOSIS — T451X5D Adverse effect of antineoplastic and immunosuppressive drugs, subsequent encounter: Secondary | ICD-10-CM | POA: Diagnosis not present

## 2020-10-29 DIAGNOSIS — N1832 Chronic kidney disease, stage 3b: Secondary | ICD-10-CM | POA: Diagnosis not present

## 2020-10-29 DIAGNOSIS — C541 Malignant neoplasm of endometrium: Secondary | ICD-10-CM | POA: Diagnosis not present

## 2020-10-29 DIAGNOSIS — N1831 Chronic kidney disease, stage 3a: Secondary | ICD-10-CM | POA: Diagnosis not present

## 2020-10-29 DIAGNOSIS — D631 Anemia in chronic kidney disease: Secondary | ICD-10-CM | POA: Diagnosis not present

## 2020-10-29 DIAGNOSIS — Z5111 Encounter for antineoplastic chemotherapy: Secondary | ICD-10-CM | POA: Diagnosis not present

## 2020-10-29 DIAGNOSIS — G620 Drug-induced polyneuropathy: Secondary | ICD-10-CM | POA: Diagnosis not present

## 2020-10-29 DIAGNOSIS — E876 Hypokalemia: Secondary | ICD-10-CM | POA: Diagnosis not present

## 2020-10-29 DIAGNOSIS — C770 Secondary and unspecified malignant neoplasm of lymph nodes of head, face and neck: Secondary | ICD-10-CM | POA: Diagnosis not present

## 2020-10-29 DIAGNOSIS — C775 Secondary and unspecified malignant neoplasm of intrapelvic lymph nodes: Secondary | ICD-10-CM | POA: Diagnosis not present

## 2020-10-29 DIAGNOSIS — C772 Secondary and unspecified malignant neoplasm of intra-abdominal lymph nodes: Secondary | ICD-10-CM | POA: Diagnosis not present

## 2020-10-29 DIAGNOSIS — N3289 Other specified disorders of bladder: Secondary | ICD-10-CM | POA: Diagnosis not present

## 2020-10-29 DIAGNOSIS — I129 Hypertensive chronic kidney disease with stage 1 through stage 4 chronic kidney disease, or unspecified chronic kidney disease: Secondary | ICD-10-CM | POA: Diagnosis not present

## 2020-10-30 ENCOUNTER — Encounter: Payer: Self-pay | Admitting: Family Medicine

## 2020-11-04 DIAGNOSIS — C770 Secondary and unspecified malignant neoplasm of lymph nodes of head, face and neck: Secondary | ICD-10-CM | POA: Diagnosis not present

## 2020-11-04 DIAGNOSIS — N1831 Chronic kidney disease, stage 3a: Secondary | ICD-10-CM | POA: Diagnosis not present

## 2020-11-04 DIAGNOSIS — Z5111 Encounter for antineoplastic chemotherapy: Secondary | ICD-10-CM | POA: Diagnosis not present

## 2020-11-04 DIAGNOSIS — E876 Hypokalemia: Secondary | ICD-10-CM | POA: Diagnosis not present

## 2020-11-04 DIAGNOSIS — C775 Secondary and unspecified malignant neoplasm of intrapelvic lymph nodes: Secondary | ICD-10-CM | POA: Diagnosis not present

## 2020-11-04 DIAGNOSIS — C772 Secondary and unspecified malignant neoplasm of intra-abdominal lymph nodes: Secondary | ICD-10-CM | POA: Diagnosis not present

## 2020-11-04 DIAGNOSIS — N3000 Acute cystitis without hematuria: Secondary | ICD-10-CM | POA: Diagnosis not present

## 2020-11-04 DIAGNOSIS — N3289 Other specified disorders of bladder: Secondary | ICD-10-CM | POA: Diagnosis not present

## 2020-11-04 DIAGNOSIS — C541 Malignant neoplasm of endometrium: Secondary | ICD-10-CM | POA: Diagnosis not present

## 2020-11-04 DIAGNOSIS — I129 Hypertensive chronic kidney disease with stage 1 through stage 4 chronic kidney disease, or unspecified chronic kidney disease: Secondary | ICD-10-CM | POA: Diagnosis not present

## 2020-11-05 DIAGNOSIS — D61810 Antineoplastic chemotherapy induced pancytopenia: Secondary | ICD-10-CM | POA: Diagnosis not present

## 2020-11-05 DIAGNOSIS — D631 Anemia in chronic kidney disease: Secondary | ICD-10-CM | POA: Diagnosis not present

## 2020-11-05 DIAGNOSIS — N1832 Chronic kidney disease, stage 3b: Secondary | ICD-10-CM | POA: Diagnosis not present

## 2020-11-05 DIAGNOSIS — N390 Urinary tract infection, site not specified: Secondary | ICD-10-CM | POA: Diagnosis not present

## 2020-11-05 DIAGNOSIS — I129 Hypertensive chronic kidney disease with stage 1 through stage 4 chronic kidney disease, or unspecified chronic kidney disease: Secondary | ICD-10-CM | POA: Diagnosis not present

## 2020-11-05 DIAGNOSIS — C775 Secondary and unspecified malignant neoplasm of intrapelvic lymph nodes: Secondary | ICD-10-CM | POA: Diagnosis not present

## 2020-11-05 DIAGNOSIS — T451X5D Adverse effect of antineoplastic and immunosuppressive drugs, subsequent encounter: Secondary | ICD-10-CM | POA: Diagnosis not present

## 2020-11-05 DIAGNOSIS — C541 Malignant neoplasm of endometrium: Secondary | ICD-10-CM | POA: Diagnosis not present

## 2020-11-05 DIAGNOSIS — G620 Drug-induced polyneuropathy: Secondary | ICD-10-CM | POA: Diagnosis not present

## 2020-11-07 DIAGNOSIS — D61810 Antineoplastic chemotherapy induced pancytopenia: Secondary | ICD-10-CM | POA: Diagnosis not present

## 2020-11-07 DIAGNOSIS — D631 Anemia in chronic kidney disease: Secondary | ICD-10-CM | POA: Diagnosis not present

## 2020-11-07 DIAGNOSIS — N390 Urinary tract infection, site not specified: Secondary | ICD-10-CM | POA: Diagnosis not present

## 2020-11-07 DIAGNOSIS — C541 Malignant neoplasm of endometrium: Secondary | ICD-10-CM | POA: Diagnosis not present

## 2020-11-07 DIAGNOSIS — N1832 Chronic kidney disease, stage 3b: Secondary | ICD-10-CM | POA: Diagnosis not present

## 2020-11-07 DIAGNOSIS — T451X5D Adverse effect of antineoplastic and immunosuppressive drugs, subsequent encounter: Secondary | ICD-10-CM | POA: Diagnosis not present

## 2020-11-07 DIAGNOSIS — G620 Drug-induced polyneuropathy: Secondary | ICD-10-CM | POA: Diagnosis not present

## 2020-11-07 DIAGNOSIS — I129 Hypertensive chronic kidney disease with stage 1 through stage 4 chronic kidney disease, or unspecified chronic kidney disease: Secondary | ICD-10-CM | POA: Diagnosis not present

## 2020-11-07 DIAGNOSIS — C775 Secondary and unspecified malignant neoplasm of intrapelvic lymph nodes: Secondary | ICD-10-CM | POA: Diagnosis not present

## 2020-11-08 DIAGNOSIS — N1832 Chronic kidney disease, stage 3b: Secondary | ICD-10-CM | POA: Diagnosis not present

## 2020-11-08 DIAGNOSIS — I129 Hypertensive chronic kidney disease with stage 1 through stage 4 chronic kidney disease, or unspecified chronic kidney disease: Secondary | ICD-10-CM | POA: Diagnosis not present

## 2020-11-08 DIAGNOSIS — D61810 Antineoplastic chemotherapy induced pancytopenia: Secondary | ICD-10-CM | POA: Diagnosis not present

## 2020-11-08 DIAGNOSIS — T451X5D Adverse effect of antineoplastic and immunosuppressive drugs, subsequent encounter: Secondary | ICD-10-CM | POA: Diagnosis not present

## 2020-11-08 DIAGNOSIS — N390 Urinary tract infection, site not specified: Secondary | ICD-10-CM | POA: Diagnosis not present

## 2020-11-08 DIAGNOSIS — C775 Secondary and unspecified malignant neoplasm of intrapelvic lymph nodes: Secondary | ICD-10-CM | POA: Diagnosis not present

## 2020-11-08 DIAGNOSIS — D631 Anemia in chronic kidney disease: Secondary | ICD-10-CM | POA: Diagnosis not present

## 2020-11-08 DIAGNOSIS — G620 Drug-induced polyneuropathy: Secondary | ICD-10-CM | POA: Diagnosis not present

## 2020-11-08 DIAGNOSIS — C541 Malignant neoplasm of endometrium: Secondary | ICD-10-CM | POA: Diagnosis not present

## 2020-11-11 DIAGNOSIS — N390 Urinary tract infection, site not specified: Secondary | ICD-10-CM | POA: Diagnosis not present

## 2020-11-11 DIAGNOSIS — D61810 Antineoplastic chemotherapy induced pancytopenia: Secondary | ICD-10-CM | POA: Diagnosis not present

## 2020-11-11 DIAGNOSIS — T451X5D Adverse effect of antineoplastic and immunosuppressive drugs, subsequent encounter: Secondary | ICD-10-CM | POA: Diagnosis not present

## 2020-11-11 DIAGNOSIS — C541 Malignant neoplasm of endometrium: Secondary | ICD-10-CM | POA: Diagnosis not present

## 2020-11-11 DIAGNOSIS — C775 Secondary and unspecified malignant neoplasm of intrapelvic lymph nodes: Secondary | ICD-10-CM | POA: Diagnosis not present

## 2020-11-11 DIAGNOSIS — I129 Hypertensive chronic kidney disease with stage 1 through stage 4 chronic kidney disease, or unspecified chronic kidney disease: Secondary | ICD-10-CM | POA: Diagnosis not present

## 2020-11-11 DIAGNOSIS — N1832 Chronic kidney disease, stage 3b: Secondary | ICD-10-CM | POA: Diagnosis not present

## 2020-11-11 DIAGNOSIS — G620 Drug-induced polyneuropathy: Secondary | ICD-10-CM | POA: Diagnosis not present

## 2020-11-11 DIAGNOSIS — D631 Anemia in chronic kidney disease: Secondary | ICD-10-CM | POA: Diagnosis not present

## 2020-11-16 DIAGNOSIS — S32000A Wedge compression fracture of unspecified lumbar vertebra, initial encounter for closed fracture: Secondary | ICD-10-CM | POA: Diagnosis not present

## 2020-11-18 DIAGNOSIS — R399 Unspecified symptoms and signs involving the genitourinary system: Secondary | ICD-10-CM | POA: Diagnosis not present

## 2020-11-20 DIAGNOSIS — R531 Weakness: Secondary | ICD-10-CM | POA: Diagnosis not present

## 2020-11-20 DIAGNOSIS — A419 Sepsis, unspecified organism: Secondary | ICD-10-CM | POA: Diagnosis not present

## 2020-11-20 DIAGNOSIS — R1031 Right lower quadrant pain: Secondary | ICD-10-CM | POA: Diagnosis not present

## 2020-11-20 DIAGNOSIS — R509 Fever, unspecified: Secondary | ICD-10-CM | POA: Diagnosis not present

## 2020-11-20 DIAGNOSIS — I272 Pulmonary hypertension, unspecified: Secondary | ICD-10-CM | POA: Diagnosis not present

## 2020-11-20 DIAGNOSIS — R6521 Severe sepsis with septic shock: Secondary | ICD-10-CM | POA: Diagnosis not present

## 2020-11-20 DIAGNOSIS — C775 Secondary and unspecified malignant neoplasm of intrapelvic lymph nodes: Secondary | ICD-10-CM | POA: Diagnosis not present

## 2020-11-20 DIAGNOSIS — Z20822 Contact with and (suspected) exposure to covid-19: Secondary | ICD-10-CM | POA: Diagnosis not present

## 2020-11-20 DIAGNOSIS — D61818 Other pancytopenia: Secondary | ICD-10-CM | POA: Diagnosis not present

## 2020-11-20 DIAGNOSIS — N179 Acute kidney failure, unspecified: Secondary | ICD-10-CM | POA: Diagnosis not present

## 2020-11-20 DIAGNOSIS — R1032 Left lower quadrant pain: Secondary | ICD-10-CM | POA: Diagnosis not present

## 2020-11-20 DIAGNOSIS — R000 Tachycardia, unspecified: Secondary | ICD-10-CM | POA: Diagnosis not present

## 2020-11-20 DIAGNOSIS — E871 Hypo-osmolality and hyponatremia: Secondary | ICD-10-CM | POA: Diagnosis not present

## 2020-11-20 DIAGNOSIS — N390 Urinary tract infection, site not specified: Secondary | ICD-10-CM | POA: Diagnosis not present

## 2020-11-20 DIAGNOSIS — C541 Malignant neoplasm of endometrium: Secondary | ICD-10-CM | POA: Diagnosis not present

## 2020-11-20 DIAGNOSIS — D631 Anemia in chronic kidney disease: Secondary | ICD-10-CM | POA: Diagnosis not present

## 2020-11-20 DIAGNOSIS — A4189 Other specified sepsis: Secondary | ICD-10-CM | POA: Diagnosis not present

## 2020-11-20 DIAGNOSIS — N1832 Chronic kidney disease, stage 3b: Secondary | ICD-10-CM | POA: Diagnosis not present

## 2020-11-20 DIAGNOSIS — I129 Hypertensive chronic kidney disease with stage 1 through stage 4 chronic kidney disease, or unspecified chronic kidney disease: Secondary | ICD-10-CM | POA: Diagnosis not present

## 2020-11-20 DIAGNOSIS — T451X5D Adverse effect of antineoplastic and immunosuppressive drugs, subsequent encounter: Secondary | ICD-10-CM | POA: Diagnosis not present

## 2020-11-20 DIAGNOSIS — E872 Acidosis: Secondary | ICD-10-CM | POA: Diagnosis not present

## 2020-11-20 DIAGNOSIS — N3090 Cystitis, unspecified without hematuria: Secondary | ICD-10-CM | POA: Diagnosis not present

## 2020-11-20 DIAGNOSIS — D61810 Antineoplastic chemotherapy induced pancytopenia: Secondary | ICD-10-CM | POA: Diagnosis not present

## 2020-11-20 DIAGNOSIS — N12 Tubulo-interstitial nephritis, not specified as acute or chronic: Secondary | ICD-10-CM | POA: Diagnosis not present

## 2020-11-20 DIAGNOSIS — G620 Drug-induced polyneuropathy: Secondary | ICD-10-CM | POA: Diagnosis not present

## 2020-11-21 DIAGNOSIS — N390 Urinary tract infection, site not specified: Secondary | ICD-10-CM | POA: Diagnosis not present

## 2020-11-21 DIAGNOSIS — A419 Sepsis, unspecified organism: Secondary | ICD-10-CM | POA: Diagnosis not present

## 2020-11-22 ENCOUNTER — Ambulatory Visit: Payer: Medicare HMO | Admitting: Family Medicine

## 2020-11-22 DIAGNOSIS — A419 Sepsis, unspecified organism: Secondary | ICD-10-CM | POA: Diagnosis not present

## 2020-11-22 DIAGNOSIS — N390 Urinary tract infection, site not specified: Secondary | ICD-10-CM | POA: Diagnosis not present

## 2020-11-23 DIAGNOSIS — N390 Urinary tract infection, site not specified: Secondary | ICD-10-CM | POA: Diagnosis not present

## 2020-11-23 DIAGNOSIS — D703 Neutropenia due to infection: Secondary | ICD-10-CM | POA: Insufficient documentation

## 2020-11-23 DIAGNOSIS — A419 Sepsis, unspecified organism: Secondary | ICD-10-CM | POA: Diagnosis not present

## 2020-11-24 DIAGNOSIS — A419 Sepsis, unspecified organism: Secondary | ICD-10-CM | POA: Diagnosis not present

## 2020-11-24 DIAGNOSIS — N390 Urinary tract infection, site not specified: Secondary | ICD-10-CM | POA: Diagnosis not present

## 2020-11-25 DIAGNOSIS — N390 Urinary tract infection, site not specified: Secondary | ICD-10-CM | POA: Diagnosis not present

## 2020-11-25 DIAGNOSIS — A419 Sepsis, unspecified organism: Secondary | ICD-10-CM | POA: Diagnosis not present

## 2020-11-26 ENCOUNTER — Other Ambulatory Visit: Payer: Self-pay

## 2020-11-26 NOTE — Patient Outreach (Signed)
[facility]) Care Management  11/26/2020  Amy Wilson 08-06-1951 [id number]     Transition of Care Referral  Referral Date: 11/26/2020 Referral Source: [facility] Discharge Report Date of Discharge: 11/25/2020 Facility: [place]    Outreach attempt # 1 to patient Spoke with patient who denies any acute issues or concerns at present. She reports that she is feeling fairly well. Patient confirms she has all her meds in the home and no issues or concerns regarding them. She has supportive family who is assisting as needed. She goes for PCP follow up appt tomorrow and her son in law will be taking her. [facility] services reviewed and discussed. Patient politely declined as she reported she does not have any needs or concerns at present. She is aware that she can call if any future needs arise. She voiced understanding and was appreciative of call.     Plan: RN CM will close case at this time.    Enzo Montgomery, RN,BSN,CCM [facility] Telephonic Care Management Coordinator Direct Phone: [phone] Toll Free: [phone] Fax: [phone]

## 2020-11-27 ENCOUNTER — Other Ambulatory Visit: Payer: Self-pay

## 2020-11-27 ENCOUNTER — Ambulatory Visit (INDEPENDENT_AMBULATORY_CARE_PROVIDER_SITE_OTHER): Payer: Medicare HMO | Admitting: Family Medicine

## 2020-11-27 ENCOUNTER — Encounter: Payer: Self-pay | Admitting: Family Medicine

## 2020-11-27 VITALS — BP 95/57 | HR 128

## 2020-11-27 DIAGNOSIS — N390 Urinary tract infection, site not specified: Secondary | ICD-10-CM | POA: Insufficient documentation

## 2020-11-27 DIAGNOSIS — A419 Sepsis, unspecified organism: Secondary | ICD-10-CM

## 2020-11-27 DIAGNOSIS — N1330 Unspecified hydronephrosis: Secondary | ICD-10-CM

## 2020-11-27 DIAGNOSIS — I10 Essential (primary) hypertension: Secondary | ICD-10-CM | POA: Diagnosis not present

## 2020-11-27 MED ORDER — NITROFURANTOIN MACROCRYSTAL 50 MG PO CAPS: 50.0000 mg | ORAL_CAPSULE | Freq: Every day | ORAL | 0 refills | Status: DC

## 2020-11-27 NOTE — Assessment & Plan Note (Signed)
Blood pressure is a little bit low today though she is not actually taking blood pressure medication.  She is feeling nauseated just encouraged her to push fluids.

## 2020-11-27 NOTE — Progress Notes (Signed)
Established Patient Office Visit  Subjective:  Patient ID: Amy Wilson, female    DOB: 1951/01/04  Age: 69 y.o. MRN: [id number]  CC: No chief complaint on file.   HPI Amy Wilson presents for hospital f/u for sepsis secondary to UTI. Admitted 11/20/2020 and D/C home on 12/6. She had just finished her 3rd round of antibiotics and had been off for about 2 days while awaiting a repeat culture when she suddenly got worse. Last urine culture grew out Klebsiella.  She did require a blood transfusion.    She currently has bilat ureteral stents - Has been seeing Celest, NP at the Urology office.  She was noted to bilateral hydronephrosis that seems to have slightly increased since previous imaging on 08/23/2020 on the left.   Feels nauseated this AM and took her compazine. She at normally last night and has been having regular BMs.    Past Medical History:  Diagnosis Date  . 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 ABDOMINAL HYSTERECTOMY    . kidney stents    . KNEE ARTHROSCOPY Right     Family History  Problem Relation Age of Onset  . Heart disease Father   . Colon cancer Maternal Grandmother   . Stomach cancer Maternal Grandmother     Social History   Socioeconomic History  . Marital status: Divorced    Spouse name: Not on file  . Number of children: 2  . Years of education: 81  . Highest education level: 12th grade  Occupational History  . Occupation: retail    Comment: retired  Tobacco Use  . Smoking status: Never Smoker  . Smokeless tobacco: Never Used  Vaping Use  . Vaping Use: Never used  Substance and Sexual Activity  . Alcohol use: Never  . Drug use: No  . Sexual activity: Never  Other Topics Concern  . Not on file  Social History Narrative   Patient has cancer and doing chemo and radiation. Stays tired all the time   Social Determinants of Health   Financial Resource Strain:   . Difficulty of Paying Living  Expenses: Not on file  Food Insecurity:   . Worried About Charity fundraiser in the Last Year: Not on file  . Ran Out of Food in the Last Year: Not on file  Transportation Needs:   . Lack of Transportation (Medical): Not on file  . Lack of Transportation (Non-Medical): Not on file  Physical Activity:   . Days of Exercise per Week: Not on file  . Minutes of Exercise per Session: Not on file  Stress:   . Feeling of Stress : Not on file  Social Connections:   . Frequency of Communication with Friends and Family: Not on file  . Frequency of Social Gatherings with Friends and Family: Not on file  . Attends Religious Services: Not on file  . Active Member of Clubs or Organizations: Not on file  . Attends Archivist Meetings: Not on file  . Marital Status: Not on file  Intimate Partner Violence:   . Fear of Current or Ex-Partner: Not on file  . Emotionally Abused: Not on file  . Physically Abused: Not on file  . Sexually Abused: Not on file    Outpatient Medications Prior to Visit  Medication Sig Dispense Refill  . calcium carbonate (OS-CAL - DOSED IN MG OF ELEMENTAL CALCIUM) 1250 (500 Ca) MG tablet  Take 1 tablet by mouth daily.    . cefdinir (OMNICEF) 300 MG capsule Take by mouth.    . cholecalciferol (VITAMIN D) 25 MCG (1000 UT) tablet Take 1 tablet by mouth daily.    . D-Mannose 500 MG CAPS Take 2 capsules by mouth daily.    Marland Kitchen esomeprazole (NEXIUM) 20 MG capsule Take 10 mg by mouth daily before breakfast. Take 10 mg 30 minutes before breakfast    . gabapentin (NEURONTIN) 100 MG capsule Take by mouth.    Marland Kitchen LORazepam (ATIVAN) 1 MG tablet Take by mouth.    . magnesium oxide (MAG-OX) 400 MG tablet Take by mouth.    Marland Kitchen MYRBETRIQ 25 MG TB24 tablet     . oxybutynin (DITROPAN) 5 MG tablet Take 1 tablet (5 mg total) by mouth 2 (two) times daily. 60 tablet 1  . potassium chloride (KLOR-CON) 10 MEQ tablet Take by mouth.    . prochlorperazine (COMPAZINE) 5 MG tablet     .  senna-docusate (SENOKOT-S) 8.6-50 MG tablet Take 1 tablet by mouth daily.     No facility-administered medications prior to visit.    Allergies  Allergen Reactions  . Latex Itching and Rash    ROS Review of Systems    Objective:    Physical Exam Constitutional:      Appearance: She is well-developed.  HENT:     Head: Normocephalic and atraumatic.  Cardiovascular:     Rate and Rhythm: Normal rate and regular rhythm.     Heart sounds: Normal heart sounds.  Pulmonary:     Effort: Pulmonary effort is normal.     Breath sounds: Normal breath sounds.  Skin:    General: Skin is warm and dry.  Neurological:     Mental Status: She is alert and oriented to person, place, and time.  Psychiatric:        Behavior: Behavior normal.     BP (!) 95/57   Pulse (!) 128   SpO2 100%  Wt Readings from Last 3 Encounters:  08/08/20 152 lb (68.9 kg)  07/18/20 158 lb (71.7 kg)  03/01/20 167 lb (75.8 kg)     Health Maintenance Due  Topic Date Due  . MAMMOGRAM  03/04/2019    There are no preventive care reminders to display for this patient.  Lab Results  Component Value Date   TSH 3.70 03/05/2020   Lab Results  Component Value Date   WBC 7.2 03/05/2020   HGB 13.1 03/05/2020   HCT 39.8 03/05/2020   MCV 88.6 03/05/2020   PLT 158 03/05/2020   Lab Results  Component Value Date   NA 138 03/05/2020   K 4.0 03/05/2020   CO2 26 03/05/2020   GLUCOSE 94 03/05/2020   BUN 17 03/05/2020   CREATININE 0.97 (H) 03/05/2020   BILITOT 0.5 03/05/2020   ALKPHOS 81 02/19/2017   AST 16 03/05/2020   ALT 14 03/05/2020   PROT 7.2 03/05/2020   ALBUMIN 4.2 02/19/2017   CALCIUM 9.3 03/05/2020   Lab Results  Component Value Date   CHOL 187 03/05/2020   Lab Results  Component Value Date   HDL 29 (L) 03/05/2020   Lab Results  Component Value Date   LDLCALC 130 (H) 03/05/2020   Lab Results  Component Value Date   TRIG 166 (H) 03/05/2020   Lab Results  Component Value Date    CHOLHDL 6.4 (H) 03/05/2020   Lab Results  Component Value Date   HGBA1C 5.3 08/08/2020  Assessment & Plan:   Problem List Items Addressed This Visit      Cardiovascular and Mediastinum   HYPERTENSION, BENIGN SYSTEMIC    Blood pressure is a little bit low today though she is not actually taking blood pressure medication.  She is feeling nauseated just encouraged her to push fluids.        Genitourinary   Recurrent UTI - Primary    Discussed to complete her current ABX, Omnicef. Plan to recheck urine. Discussed prophylaxis. Will start nitrofurantoin 50mg  daily.  She is feeling some better.        Relevant Medications   cefdinir (OMNICEF) 300 MG capsule   nitrofurantoin (MACRODANTIN) 50 MG capsule   Hydronephrosis    Next f/u with Urology in Jan to change our the stents. Some concern that the hydronephrosis on the left is worse.          Other   Sepsis secondary to UTI [facility])    Now resolved.      Relevant Medications   cefdinir (OMNICEF) 300 MG capsule   nitrofurantoin (MACRODANTIN) 50 MG capsule      Meds ordered this encounter  Medications  . nitrofurantoin (MACRODANTIN) 50 MG capsule    Sig: Take 1 capsule (50 mg total) by mouth at bedtime. For prophylaxis for bladder infection    Dispense:  90 capsule    Refill:  0    Follow-up: No follow-ups on file.    I spent 40 minutes on the day of the encounter to include pre-visit record review, face-to-face time with the patient and post visit ordering of test.   [name], MD

## 2020-11-27 NOTE — Assessment & Plan Note (Signed)
Now resolved.  

## 2020-11-27 NOTE — Assessment & Plan Note (Signed)
Next f/u with Urology in Jan to change our the stents. Some concern that the hydronephrosis on the left is worse.

## 2020-11-27 NOTE — Assessment & Plan Note (Addendum)
Discussed to complete her current ABX, Omnicef. Plan to recheck urine. Discussed prophylaxis. Will start nitrofurantoin 50mg  daily.  She is feeling some better.

## 2020-12-03 ENCOUNTER — Other Ambulatory Visit: Payer: Self-pay | Admitting: *Deleted

## 2020-12-03 DIAGNOSIS — R319 Hematuria, unspecified: Secondary | ICD-10-CM | POA: Diagnosis not present

## 2020-12-03 DIAGNOSIS — N390 Urinary tract infection, site not specified: Secondary | ICD-10-CM | POA: Diagnosis not present

## 2020-12-03 LAB — POCT URINALYSIS DIP (CLINITEK)
Bilirubin, UA: NEGATIVE
Glucose, UA: NEGATIVE mg/dL
Ketones, POC UA: NEGATIVE mg/dL
Nitrite, UA: NEGATIVE
POC PROTEIN,UA: 100 — AB
Spec Grav, UA: 1.02 (ref 1.010–1.025)
Urobilinogen, UA: 0.2 E.U./dL
pH, UA: 6 (ref 5.0–8.0)

## 2020-12-04 DIAGNOSIS — C772 Secondary and unspecified malignant neoplasm of intra-abdominal lymph nodes: Secondary | ICD-10-CM | POA: Diagnosis not present

## 2020-12-04 DIAGNOSIS — C775 Secondary and unspecified malignant neoplasm of intrapelvic lymph nodes: Secondary | ICD-10-CM | POA: Diagnosis not present

## 2020-12-04 DIAGNOSIS — G620 Drug-induced polyneuropathy: Secondary | ICD-10-CM | POA: Diagnosis not present

## 2020-12-04 DIAGNOSIS — N1330 Unspecified hydronephrosis: Secondary | ICD-10-CM | POA: Diagnosis not present

## 2020-12-04 DIAGNOSIS — C770 Secondary and unspecified malignant neoplasm of lymph nodes of head, face and neck: Secondary | ICD-10-CM | POA: Diagnosis not present

## 2020-12-04 DIAGNOSIS — G893 Neoplasm related pain (acute) (chronic): Secondary | ICD-10-CM | POA: Diagnosis not present

## 2020-12-04 DIAGNOSIS — Z79899 Other long term (current) drug therapy: Secondary | ICD-10-CM | POA: Diagnosis not present

## 2020-12-04 DIAGNOSIS — C541 Malignant neoplasm of endometrium: Secondary | ICD-10-CM | POA: Diagnosis not present

## 2020-12-04 DIAGNOSIS — T451X5A Adverse effect of antineoplastic and immunosuppressive drugs, initial encounter: Secondary | ICD-10-CM | POA: Diagnosis not present

## 2020-12-05 LAB — URINE CULTURE
MICRO NUMBER:: 11318909
SPECIMEN QUALITY:: ADEQUATE

## 2020-12-09 DIAGNOSIS — R5381 Other malaise: Secondary | ICD-10-CM | POA: Diagnosis not present

## 2020-12-09 DIAGNOSIS — M792 Neuralgia and neuritis, unspecified: Secondary | ICD-10-CM | POA: Diagnosis not present

## 2020-12-09 DIAGNOSIS — G609 Hereditary and idiopathic neuropathy, unspecified: Secondary | ICD-10-CM | POA: Diagnosis not present

## 2020-12-09 DIAGNOSIS — R29898 Other symptoms and signs involving the musculoskeletal system: Secondary | ICD-10-CM | POA: Diagnosis not present

## 2020-12-11 ENCOUNTER — Other Ambulatory Visit: Payer: Self-pay | Admitting: *Deleted

## 2020-12-11 ENCOUNTER — Encounter: Payer: Self-pay | Admitting: Family Medicine

## 2020-12-11 MED ORDER — GABAPENTIN 100 MG PO CAPS
100.0000 mg | ORAL_CAPSULE | Freq: Every day | ORAL | 0 refills | Status: DC
Start: 2020-12-11 — End: 2021-01-15

## 2020-12-16 DIAGNOSIS — S32000A Wedge compression fracture of unspecified lumbar vertebra, initial encounter for closed fracture: Secondary | ICD-10-CM | POA: Diagnosis not present

## 2020-12-17 ENCOUNTER — Encounter: Payer: Self-pay | Admitting: Family Medicine

## 2020-12-17 DIAGNOSIS — R399 Unspecified symptoms and signs involving the genitourinary system: Secondary | ICD-10-CM | POA: Diagnosis not present

## 2020-12-17 DIAGNOSIS — I129 Hypertensive chronic kidney disease with stage 1 through stage 4 chronic kidney disease, or unspecified chronic kidney disease: Secondary | ICD-10-CM | POA: Diagnosis not present

## 2020-12-17 DIAGNOSIS — N3000 Acute cystitis without hematuria: Secondary | ICD-10-CM | POA: Diagnosis not present

## 2020-12-17 DIAGNOSIS — N183 Chronic kidney disease, stage 3 unspecified: Secondary | ICD-10-CM | POA: Diagnosis not present

## 2020-12-17 DIAGNOSIS — R000 Tachycardia, unspecified: Secondary | ICD-10-CM | POA: Diagnosis not present

## 2020-12-17 DIAGNOSIS — K8020 Calculus of gallbladder without cholecystitis without obstruction: Secondary | ICD-10-CM | POA: Diagnosis not present

## 2020-12-17 DIAGNOSIS — N1330 Unspecified hydronephrosis: Secondary | ICD-10-CM | POA: Diagnosis not present

## 2020-12-17 DIAGNOSIS — K838 Other specified diseases of biliary tract: Secondary | ICD-10-CM | POA: Diagnosis not present

## 2020-12-17 DIAGNOSIS — N63 Unspecified lump in unspecified breast: Secondary | ICD-10-CM | POA: Diagnosis not present

## 2020-12-17 DIAGNOSIS — N390 Urinary tract infection, site not specified: Secondary | ICD-10-CM | POA: Diagnosis not present

## 2020-12-17 DIAGNOSIS — C541 Malignant neoplasm of endometrium: Secondary | ICD-10-CM | POA: Diagnosis not present

## 2020-12-17 DIAGNOSIS — G893 Neoplasm related pain (acute) (chronic): Secondary | ICD-10-CM | POA: Diagnosis not present

## 2020-12-17 NOTE — Telephone Encounter (Signed)
Urine has been faxed to urology

## 2020-12-17 NOTE — Telephone Encounter (Signed)
It has been faxed.  

## 2020-12-18 DIAGNOSIS — R000 Tachycardia, unspecified: Secondary | ICD-10-CM | POA: Diagnosis not present

## 2020-12-18 DIAGNOSIS — I491 Atrial premature depolarization: Secondary | ICD-10-CM | POA: Diagnosis not present

## 2020-12-18 DIAGNOSIS — Z923 Personal history of irradiation: Secondary | ICD-10-CM | POA: Diagnosis not present

## 2020-12-18 DIAGNOSIS — Z8542 Personal history of malignant neoplasm of other parts of uterus: Secondary | ICD-10-CM | POA: Diagnosis not present

## 2020-12-18 DIAGNOSIS — R300 Dysuria: Secondary | ICD-10-CM | POA: Diagnosis not present

## 2020-12-18 DIAGNOSIS — N1831 Chronic kidney disease, stage 3a: Secondary | ICD-10-CM | POA: Diagnosis not present

## 2020-12-18 DIAGNOSIS — Z9221 Personal history of antineoplastic chemotherapy: Secondary | ICD-10-CM | POA: Diagnosis not present

## 2020-12-19 DIAGNOSIS — R1084 Generalized abdominal pain: Secondary | ICD-10-CM | POA: Diagnosis not present

## 2020-12-19 DIAGNOSIS — C541 Malignant neoplasm of endometrium: Secondary | ICD-10-CM | POA: Diagnosis not present

## 2020-12-19 DIAGNOSIS — N390 Urinary tract infection, site not specified: Secondary | ICD-10-CM | POA: Diagnosis not present

## 2020-12-19 DIAGNOSIS — G620 Drug-induced polyneuropathy: Secondary | ICD-10-CM | POA: Diagnosis not present

## 2020-12-19 DIAGNOSIS — G893 Neoplasm related pain (acute) (chronic): Secondary | ICD-10-CM | POA: Diagnosis not present

## 2020-12-27 DIAGNOSIS — Z01812 Encounter for preprocedural laboratory examination: Secondary | ICD-10-CM | POA: Diagnosis not present

## 2020-12-27 DIAGNOSIS — Z20822 Contact with and (suspected) exposure to covid-19: Secondary | ICD-10-CM | POA: Diagnosis not present

## 2020-12-27 DIAGNOSIS — N135 Crossing vessel and stricture of ureter without hydronephrosis: Secondary | ICD-10-CM | POA: Diagnosis not present

## 2021-01-02 DIAGNOSIS — N131 Hydronephrosis with ureteral stricture, not elsewhere classified: Secondary | ICD-10-CM | POA: Diagnosis not present

## 2021-01-02 DIAGNOSIS — N135 Crossing vessel and stricture of ureter without hydronephrosis: Secondary | ICD-10-CM | POA: Diagnosis not present

## 2021-01-02 DIAGNOSIS — Z466 Encounter for fitting and adjustment of urinary device: Secondary | ICD-10-CM | POA: Diagnosis not present

## 2021-01-02 DIAGNOSIS — K219 Gastro-esophageal reflux disease without esophagitis: Secondary | ICD-10-CM | POA: Diagnosis not present

## 2021-01-02 DIAGNOSIS — Z90710 Acquired absence of both cervix and uterus: Secondary | ICD-10-CM | POA: Diagnosis not present

## 2021-01-02 DIAGNOSIS — N1832 Chronic kidney disease, stage 3b: Secondary | ICD-10-CM | POA: Diagnosis not present

## 2021-01-02 DIAGNOSIS — Z8542 Personal history of malignant neoplasm of other parts of uterus: Secondary | ICD-10-CM | POA: Diagnosis not present

## 2021-01-02 DIAGNOSIS — Z9221 Personal history of antineoplastic chemotherapy: Secondary | ICD-10-CM | POA: Diagnosis not present

## 2021-01-02 DIAGNOSIS — I272 Pulmonary hypertension, unspecified: Secondary | ICD-10-CM | POA: Diagnosis not present

## 2021-01-02 DIAGNOSIS — I129 Hypertensive chronic kidney disease with stage 1 through stage 4 chronic kidney disease, or unspecified chronic kidney disease: Secondary | ICD-10-CM | POA: Diagnosis not present

## 2021-01-08 ENCOUNTER — Encounter: Payer: Self-pay | Admitting: Family Medicine

## 2021-01-08 DIAGNOSIS — N390 Urinary tract infection, site not specified: Secondary | ICD-10-CM

## 2021-01-08 NOTE — Telephone Encounter (Signed)
Yes, OK for urine cx as below

## 2021-01-13 ENCOUNTER — Telehealth: Payer: Self-pay | Admitting: Family Medicine

## 2021-01-13 DIAGNOSIS — M792 Neuralgia and neuritis, unspecified: Secondary | ICD-10-CM | POA: Diagnosis not present

## 2021-01-13 DIAGNOSIS — R29898 Other symptoms and signs involving the musculoskeletal system: Secondary | ICD-10-CM | POA: Diagnosis not present

## 2021-01-13 DIAGNOSIS — N390 Urinary tract infection, site not specified: Secondary | ICD-10-CM | POA: Diagnosis not present

## 2021-01-13 DIAGNOSIS — G894 Chronic pain syndrome: Secondary | ICD-10-CM | POA: Diagnosis not present

## 2021-01-13 DIAGNOSIS — R5381 Other malaise: Secondary | ICD-10-CM | POA: Diagnosis not present

## 2021-01-13 DIAGNOSIS — Z5181 Encounter for therapeutic drug level monitoring: Secondary | ICD-10-CM | POA: Diagnosis not present

## 2021-01-13 DIAGNOSIS — Z79899 Other long term (current) drug therapy: Secondary | ICD-10-CM | POA: Diagnosis not present

## 2021-01-13 DIAGNOSIS — G609 Hereditary and idiopathic neuropathy, unspecified: Secondary | ICD-10-CM | POA: Diagnosis not present

## 2021-01-13 NOTE — Telephone Encounter (Signed)
Larene Beach called to make an appt for her mother. CC was swelling in feet and turning purple. After speaking to Levada Dy (CMA) I advised the patient to go to the ED or UC right away. She said she would be taking her right after getting off the phone, and I did tell her to call us for a follow up, after Alexiz is seen.

## 2021-01-14 ENCOUNTER — Encounter: Payer: Self-pay | Admitting: Family Medicine

## 2021-01-14 LAB — URINE CULTURE
MICRO NUMBER:: 11450221
SPECIMEN QUALITY:: ADEQUATE

## 2021-01-15 ENCOUNTER — Other Ambulatory Visit: Payer: Self-pay

## 2021-01-15 DIAGNOSIS — Z79899 Other long term (current) drug therapy: Secondary | ICD-10-CM | POA: Diagnosis not present

## 2021-01-15 DIAGNOSIS — C775 Secondary and unspecified malignant neoplasm of intrapelvic lymph nodes: Secondary | ICD-10-CM | POA: Diagnosis not present

## 2021-01-15 DIAGNOSIS — C772 Secondary and unspecified malignant neoplasm of intra-abdominal lymph nodes: Secondary | ICD-10-CM | POA: Diagnosis not present

## 2021-01-15 DIAGNOSIS — C770 Secondary and unspecified malignant neoplasm of lymph nodes of head, face and neck: Secondary | ICD-10-CM | POA: Diagnosis not present

## 2021-01-15 DIAGNOSIS — Z5111 Encounter for antineoplastic chemotherapy: Secondary | ICD-10-CM | POA: Diagnosis not present

## 2021-01-15 DIAGNOSIS — C541 Malignant neoplasm of endometrium: Secondary | ICD-10-CM | POA: Diagnosis not present

## 2021-01-15 DIAGNOSIS — C50011 Malignant neoplasm of nipple and areola, right female breast: Secondary | ICD-10-CM | POA: Diagnosis not present

## 2021-01-15 MED ORDER — GABAPENTIN 100 MG PO CAPS: 100.0000 mg | ORAL_CAPSULE | Freq: Three times a day (TID) | ORAL | 2 refills | Status: DC

## 2021-01-15 NOTE — Telephone Encounter (Signed)
Amy Wilson states she takes Gabapentin 100 mg 3 times a day. The prescription is written for once daily. She is requesting an updated prescription.

## 2021-01-16 ENCOUNTER — Encounter: Payer: Self-pay | Admitting: Family Medicine

## 2021-01-16 DIAGNOSIS — S32000A Wedge compression fracture of unspecified lumbar vertebra, initial encounter for closed fracture: Secondary | ICD-10-CM | POA: Diagnosis not present

## 2021-01-20 DIAGNOSIS — R29898 Other symptoms and signs involving the musculoskeletal system: Secondary | ICD-10-CM | POA: Diagnosis not present

## 2021-01-20 DIAGNOSIS — R5381 Other malaise: Secondary | ICD-10-CM | POA: Diagnosis not present

## 2021-01-20 DIAGNOSIS — G620 Drug-induced polyneuropathy: Secondary | ICD-10-CM | POA: Diagnosis not present

## 2021-01-22 DIAGNOSIS — G620 Drug-induced polyneuropathy: Secondary | ICD-10-CM | POA: Diagnosis not present

## 2021-01-22 DIAGNOSIS — R29898 Other symptoms and signs involving the musculoskeletal system: Secondary | ICD-10-CM | POA: Diagnosis not present

## 2021-01-22 DIAGNOSIS — R5381 Other malaise: Secondary | ICD-10-CM | POA: Diagnosis not present

## 2021-01-24 ENCOUNTER — Encounter: Payer: Self-pay | Admitting: Family Medicine

## 2021-01-24 ENCOUNTER — Other Ambulatory Visit: Payer: Self-pay

## 2021-01-24 ENCOUNTER — Ambulatory Visit (INDEPENDENT_AMBULATORY_CARE_PROVIDER_SITE_OTHER): Payer: Medicare HMO

## 2021-01-24 ENCOUNTER — Ambulatory Visit (INDEPENDENT_AMBULATORY_CARE_PROVIDER_SITE_OTHER): Payer: Medicare HMO | Admitting: Family Medicine

## 2021-01-24 VITALS — BP 117/65 | HR 101 | Temp 98.3°F

## 2021-01-24 DIAGNOSIS — R351 Nocturia: Secondary | ICD-10-CM

## 2021-01-24 DIAGNOSIS — M7732 Calcaneal spur, left foot: Secondary | ICD-10-CM | POA: Diagnosis not present

## 2021-01-24 DIAGNOSIS — F339 Major depressive disorder, recurrent, unspecified: Secondary | ICD-10-CM

## 2021-01-24 DIAGNOSIS — M79672 Pain in left foot: Secondary | ICD-10-CM

## 2021-01-24 DIAGNOSIS — N3946 Mixed incontinence: Secondary | ICD-10-CM

## 2021-01-24 DIAGNOSIS — M85872 Other specified disorders of bone density and structure, left ankle and foot: Secondary | ICD-10-CM | POA: Diagnosis not present

## 2021-01-24 DIAGNOSIS — R531 Weakness: Secondary | ICD-10-CM | POA: Diagnosis not present

## 2021-01-24 MED ORDER — PAROXETINE HCL 10 MG PO TABS: ORAL_TABLET | ORAL | 0 refills | Status: DC

## 2021-01-24 MED ORDER — OXYBUTYNIN CHLORIDE 5 MG PO TABS: 5.0000 mg | ORAL_TABLET | Freq: Two times a day (BID) | ORAL | 1 refills | Status: DC

## 2021-01-24 MED ORDER — AMBULATORY NON FORMULARY MEDICATION: 0 refills | Status: DC

## 2021-01-27 ENCOUNTER — Encounter: Payer: Self-pay | Admitting: Family Medicine

## 2021-01-27 DIAGNOSIS — R29898 Other symptoms and signs involving the musculoskeletal system: Secondary | ICD-10-CM | POA: Diagnosis not present

## 2021-01-27 DIAGNOSIS — G620 Drug-induced polyneuropathy: Secondary | ICD-10-CM | POA: Diagnosis not present

## 2021-01-27 DIAGNOSIS — R5381 Other malaise: Secondary | ICD-10-CM | POA: Diagnosis not present

## 2021-01-27 NOTE — Assessment & Plan Note (Signed)
Scription provided for shower bench since she is really nonambulatory.

## 2021-01-27 NOTE — Progress Notes (Signed)
Established Patient Office Visit  Subjective:  Patient ID: Amy Wilson, female    DOB: 29-Oct-1951  Age: 70 y.o. MRN: [id number]  CC:  Chief Complaint  Patient presents with  . Depression    HPI Amy Wilson presents for   Left foot pain that has been going on for a few weeks.  She reports that it is mostly over the distal foot.  She does not remember any injury or trauma.  In fact she is really not able to walk and put pressure on that foot so she really does not feel like that is what is going on.  In fact recently she has started water therapy for physical therapy and says it is really been wonderful to be able to move in the water.  Sometimes she will notice a little swelling on the top of the foot as well.  He also reports that her mood has been down just with everything going on and constantly feeling ill and doing chemotherapy she really has just felt down and sad.  Her daughter is here with her today and really wants her to get some help and assistance.  She is a little hesitant about starting medication.  She is also still having some problems with overactive bladder and incontinence.  They did finally change the ureteral stent for her kidney stone and has been doing much better since then they do not really want to change the stent for another 6 months if possible but she is worried it will start harboring infection like it did previously.  In fact she became septic from the UTI.  Past Medical History:  Diagnosis Date  . 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 ABDOMINAL HYSTERECTOMY    . kidney stents    . KNEE ARTHROSCOPY Right     Family History  Problem Relation Age of Onset  . Heart disease Father   . Colon cancer Maternal Grandmother   . Stomach cancer Maternal Grandmother     Social History   Socioeconomic History  . Marital status: Divorced    Spouse name: Not on file  . Number of children: 2  . Years of  education: 50  . Highest education level: 12th grade  Occupational History  . Occupation: retail    Comment: retired  Tobacco Use  . Smoking status: Never Smoker  . Smokeless tobacco: Never Used  Vaping Use  . Vaping Use: Never used  Substance and Sexual Activity  . Alcohol use: Never  . Drug use: No  . Sexual activity: Never  Other Topics Concern  . Not on file  Social History Narrative   Patient has cancer and doing chemo and radiation. Stays tired all the time   Social Determinants of Health   Financial Resource Strain: Not on file  Food Insecurity: Not on file  Transportation Needs: Not on file  Physical Activity: Not on file  Stress: Not on file  Social Connections: Not on file  Intimate Partner Violence: Not on file    Outpatient Medications Prior to Visit  Medication Sig Dispense Refill  . calcium carbonate (OS-CAL - DOSED IN MG OF ELEMENTAL CALCIUM) 1250 (500 Ca) MG tablet Take 1 tablet by mouth daily.    . cholecalciferol (VITAMIN D) 25 MCG (1000 UT) tablet Take 1 tablet by mouth daily.    Marland Kitchen esomeprazole (NEXIUM) 20 MG capsule Take 10 mg by mouth daily before breakfast.  Take 10 mg 30 minutes before breakfast    . gabapentin (NEURONTIN) 100 MG capsule Take 1 capsule (100 mg total) by mouth 3 (three) times daily. 90 capsule 2  . magnesium oxide (MAG-OX) 400 MG tablet Take by mouth.    Marland Kitchen MYRBETRIQ 25 MG TB24 tablet     . ondansetron (ZOFRAN) 8 MG tablet Take by mouth.    . potassium chloride (KLOR-CON) 10 MEQ tablet Take by mouth.    . senna-docusate (SENOKOT-S) 8.6-50 MG tablet Take 1 tablet by mouth daily.    . tamsulosin (FLOMAX) 0.4 MG CAPS capsule Take by mouth.    . DULoxetine (CYMBALTA) 30 MG capsule Take by mouth.    . oxybutynin (DITROPAN) 5 MG tablet Take 1 tablet (5 mg total) by mouth 2 (two) times daily. 60 tablet 1  . HYDROcodone-acetaminophen (NORCO/VICODIN) 5-325 MG tablet Take by mouth. (Patient not taking: Reported on 01/24/2021)    . prochlorperazine  (COMPAZINE) 5 MG tablet  (Patient not taking: Reported on 01/24/2021)    . D-Mannose 500 MG CAPS Take 2 capsules by mouth daily.    Marland Kitchen LORazepam (ATIVAN) 1 MG tablet Take by mouth.    . nitrofurantoin (MACRODANTIN) 50 MG capsule Take 1 capsule (50 mg total) by mouth at bedtime. For prophylaxis for bladder infection 90 capsule 0   No facility-administered medications prior to visit.    Allergies  Allergen Reactions  . Latex Itching and Rash    ROS Review of Systems    Objective:    Physical Exam Vitals reviewed.  Constitutional:      Appearance: She is well-developed and well-nourished.  HENT:     Head: Normocephalic and atraumatic.  Eyes:     Extraocular Movements: EOM normal.     Conjunctiva/sclera: Conjunctivae normal.  Cardiovascular:     Rate and Rhythm: Normal rate.  Pulmonary:     Effort: Pulmonary effort is normal.  Musculoskeletal:     Comments: Left foot with some swelling distally she is tender over the second third fourth and fifth distal metatarsal heads.  Nontender at the great toe.  Good capillary refill.  Dorsal pedal pulse 1+.  Skin:    General: Skin is dry.     Coloration: Skin is not pale.  Neurological:     Mental Status: She is alert and oriented to person, place, and time.  Psychiatric:        Mood and Affect: Mood and affect normal.        Behavior: Behavior normal.     BP 117/65 (BP Location: Left Arm, Patient Position: Sitting, Cuff Size: Small)   Pulse (!) 101   Temp 98.3 F (36.8 C) (Oral)   SpO2 99%  Wt Readings from Last 3 Encounters:  08/08/20 152 lb (68.9 kg)  07/18/20 158 lb (71.7 kg)  03/01/20 167 lb (75.8 kg)     Health Maintenance Due  Topic Date Due  . MAMMOGRAM  03/04/2019    There are no preventive care reminders to display for this patient.  Lab Results  Component Value Date   TSH 3.70 03/05/2020   Lab Results  Component Value Date   WBC 7.2 03/05/2020   HGB 13.1 03/05/2020   HCT 39.8 03/05/2020   MCV 88.6  03/05/2020   PLT 158 03/05/2020   Lab Results  Component Value Date   NA 138 03/05/2020   K 4.0 03/05/2020   CO2 26 03/05/2020   GLUCOSE 94 03/05/2020   BUN 17 03/05/2020  CREATININE 0.97 (H) 03/05/2020   BILITOT 0.5 03/05/2020   ALKPHOS 81 02/19/2017   AST 16 03/05/2020   ALT 14 03/05/2020   PROT 7.2 03/05/2020   ALBUMIN 4.2 02/19/2017   CALCIUM 9.3 03/05/2020   Lab Results  Component Value Date   CHOL 187 03/05/2020   Lab Results  Component Value Date   HDL 29 (L) 03/05/2020   Lab Results  Component Value Date   LDLCALC 130 (H) 03/05/2020   Lab Results  Component Value Date   TRIG 166 (H) 03/05/2020   Lab Results  Component Value Date   CHOLHDL 6.4 (H) 03/05/2020   Lab Results  Component Value Date   HGBA1C 5.3 08/08/2020      Assessment & Plan:   Problem List Items Addressed This Visit      Other   Mixed stress and urge urinary incontinence   Relevant Medications   tamsulosin (FLOMAX) 0.4 MG CAPS capsule   oxybutynin (DITROPAN) 5 MG tablet   Generalized weakness    Scription provided for shower bench since she is really nonambulatory.      Depression, recurrent ([place])    History of recurrent depression.  I think a lot of her depression really is situational.  She also has some component of anxiety as well we discussed options she is okay with trying a prescription medication.  New prescription sent for Paxil I like to see her back in about 3 to 6 weeks to make sure that she is doing well and make any adjustments needed.  Also want her to consider therapy/counseling as well really feel like it could be helpful for her.      Relevant Medications   PARoxetine (PAXIL) 10 MG tablet   ARM PAIN, LEFT - Primary   Relevant Orders   DG Foot Complete Left (Completed)    Other Visit Diagnoses    Nocturia       Relevant Medications   oxybutynin (DITROPAN) 5 MG tablet     Left foot pain-we will get x-ray for further evaluation just to make sure there is  not an underlying fracture she has neuropathy so does not always feel pain well and it could be that the foot has actually been injured.  Will call with results once available.  stress/urge incontinence-we will refill her Flomax and also send over prescription for oxybutynin to see if this helps some with the nocturia.  She also asked for a prescription for shower bench   Meds ordered this encounter  Medications  . oxybutynin (DITROPAN) 5 MG tablet    Sig: Take 1 tablet (5 mg total) by mouth 2 (two) times daily.    Dispense:  60 tablet    Refill:  1  . AMBULATORY NON FORMULARY MEDICATION    Sig: Medication Name: Electronics engineer.  Dx Lower extremity weakness    Dispense:  1 Units    Refill:  0  . PARoxetine (PAXIL) 10 MG tablet    Sig: Take 1 tablet (10 mg total) by mouth daily for 8 days, THEN 2 tablets (20 mg total) daily for 22 days.    Dispense:  52 tablet    Refill:  0    Follow-up: Return in about 3 weeks (around 02/14/2021) for New start medication.      [name], MD

## 2021-01-27 NOTE — Assessment & Plan Note (Signed)
History of recurrent depression.  I think a lot of her depression really is situational.  She also has some component of anxiety as well we discussed options she is okay with trying a prescription medication.  New prescription sent for Paxil I like to see her back in about 3 to 6 weeks to make sure that she is doing well and make any adjustments needed.  Also want her to consider therapy/counseling as well really feel like it could be helpful for her.

## 2021-01-30 DIAGNOSIS — R5381 Other malaise: Secondary | ICD-10-CM | POA: Diagnosis not present

## 2021-01-30 DIAGNOSIS — G620 Drug-induced polyneuropathy: Secondary | ICD-10-CM | POA: Diagnosis not present

## 2021-01-30 DIAGNOSIS — R29898 Other symptoms and signs involving the musculoskeletal system: Secondary | ICD-10-CM | POA: Diagnosis not present

## 2021-02-03 DIAGNOSIS — R5381 Other malaise: Secondary | ICD-10-CM | POA: Diagnosis not present

## 2021-02-03 DIAGNOSIS — G620 Drug-induced polyneuropathy: Secondary | ICD-10-CM | POA: Diagnosis not present

## 2021-02-03 DIAGNOSIS — R29898 Other symptoms and signs involving the musculoskeletal system: Secondary | ICD-10-CM | POA: Diagnosis not present

## 2021-02-05 DIAGNOSIS — D6959 Other secondary thrombocytopenia: Secondary | ICD-10-CM | POA: Diagnosis not present

## 2021-02-05 DIAGNOSIS — C770 Secondary and unspecified malignant neoplasm of lymph nodes of head, face and neck: Secondary | ICD-10-CM | POA: Diagnosis not present

## 2021-02-05 DIAGNOSIS — C541 Malignant neoplasm of endometrium: Secondary | ICD-10-CM | POA: Diagnosis not present

## 2021-02-05 DIAGNOSIS — C775 Secondary and unspecified malignant neoplasm of intrapelvic lymph nodes: Secondary | ICD-10-CM | POA: Diagnosis not present

## 2021-02-05 DIAGNOSIS — T451X5A Adverse effect of antineoplastic and immunosuppressive drugs, initial encounter: Secondary | ICD-10-CM | POA: Diagnosis not present

## 2021-02-05 DIAGNOSIS — C772 Secondary and unspecified malignant neoplasm of intra-abdominal lymph nodes: Secondary | ICD-10-CM | POA: Diagnosis not present

## 2021-02-05 DIAGNOSIS — I10 Essential (primary) hypertension: Secondary | ICD-10-CM | POA: Diagnosis not present

## 2021-02-05 DIAGNOSIS — Z5112 Encounter for antineoplastic immunotherapy: Secondary | ICD-10-CM | POA: Diagnosis not present

## 2021-02-10 DIAGNOSIS — Z5112 Encounter for antineoplastic immunotherapy: Secondary | ICD-10-CM | POA: Diagnosis not present

## 2021-02-10 DIAGNOSIS — R29898 Other symptoms and signs involving the musculoskeletal system: Secondary | ICD-10-CM | POA: Diagnosis not present

## 2021-02-10 DIAGNOSIS — C772 Secondary and unspecified malignant neoplasm of intra-abdominal lymph nodes: Secondary | ICD-10-CM | POA: Diagnosis not present

## 2021-02-10 DIAGNOSIS — C770 Secondary and unspecified malignant neoplasm of lymph nodes of head, face and neck: Secondary | ICD-10-CM | POA: Diagnosis not present

## 2021-02-10 DIAGNOSIS — M792 Neuralgia and neuritis, unspecified: Secondary | ICD-10-CM | POA: Diagnosis not present

## 2021-02-10 DIAGNOSIS — R5381 Other malaise: Secondary | ICD-10-CM | POA: Diagnosis not present

## 2021-02-10 DIAGNOSIS — T451X5A Adverse effect of antineoplastic and immunosuppressive drugs, initial encounter: Secondary | ICD-10-CM | POA: Diagnosis not present

## 2021-02-10 DIAGNOSIS — D6959 Other secondary thrombocytopenia: Secondary | ICD-10-CM | POA: Diagnosis not present

## 2021-02-10 DIAGNOSIS — C775 Secondary and unspecified malignant neoplasm of intrapelvic lymph nodes: Secondary | ICD-10-CM | POA: Diagnosis not present

## 2021-02-10 DIAGNOSIS — G894 Chronic pain syndrome: Secondary | ICD-10-CM | POA: Diagnosis not present

## 2021-02-10 DIAGNOSIS — C541 Malignant neoplasm of endometrium: Secondary | ICD-10-CM | POA: Diagnosis not present

## 2021-02-10 DIAGNOSIS — I10 Essential (primary) hypertension: Secondary | ICD-10-CM | POA: Diagnosis not present

## 2021-02-10 DIAGNOSIS — G609 Hereditary and idiopathic neuropathy, unspecified: Secondary | ICD-10-CM | POA: Diagnosis not present

## 2021-02-11 DIAGNOSIS — Z5112 Encounter for antineoplastic immunotherapy: Secondary | ICD-10-CM | POA: Diagnosis not present

## 2021-02-11 DIAGNOSIS — C770 Secondary and unspecified malignant neoplasm of lymph nodes of head, face and neck: Secondary | ICD-10-CM | POA: Diagnosis not present

## 2021-02-11 DIAGNOSIS — C772 Secondary and unspecified malignant neoplasm of intra-abdominal lymph nodes: Secondary | ICD-10-CM | POA: Diagnosis not present

## 2021-02-11 DIAGNOSIS — C541 Malignant neoplasm of endometrium: Secondary | ICD-10-CM | POA: Diagnosis not present

## 2021-02-11 DIAGNOSIS — C775 Secondary and unspecified malignant neoplasm of intrapelvic lymph nodes: Secondary | ICD-10-CM | POA: Diagnosis not present

## 2021-02-16 DIAGNOSIS — S32000A Wedge compression fracture of unspecified lumbar vertebra, initial encounter for closed fracture: Secondary | ICD-10-CM | POA: Diagnosis not present

## 2021-02-17 ENCOUNTER — Telehealth (INDEPENDENT_AMBULATORY_CARE_PROVIDER_SITE_OTHER): Payer: Medicare HMO | Admitting: Family Medicine

## 2021-02-17 ENCOUNTER — Encounter: Payer: Self-pay | Admitting: Family Medicine

## 2021-02-17 DIAGNOSIS — F339 Major depressive disorder, recurrent, unspecified: Secondary | ICD-10-CM | POA: Diagnosis not present

## 2021-02-17 NOTE — Progress Notes (Signed)
Virtual Visit via Video Note  I connected with Amy Wilson on 02/17/21 at  8:30 AM EST by a video enabled telemedicine application and verified that I am speaking with the correct person using two identifiers.   I discussed the limitations of evaluation and management by telemedicine and the availability of in person appointments. The patient expressed understanding and agreed to proceed.  Patient location: at home, lying in bed Provider location: in office  Subjective:    CC: F/U new start Paxil  X 3 weeks  HPI: Says this last round of chemo has been really hard. She has been been drinking boost. Decreased appetite on the chemotherapy. She has been feeling down. Has appt with counselor through the cancer center for March 11th through the cancer center. So far no side effects of the medication.  She says she just always feels like she has been able to pull herself out of feeling down or feeling depressed but this time just really cannot seem to do it.  One of the biggest things that is what been weighing on her is the fact that she is unable to walk without assistance now because of weakness in her left leg.  Past medical history, Surgical history, Family history not pertinant except as noted below, Social history, Allergies, and medications have been entered into the medical record, reviewed, and corrections made.   Review of Systems: No fevers, chills, night sweats, weight loss, chest pain, or shortness of breath.   Objective:    General: Speaking clearly in complete sentences without any shortness of breath.  Alert and oriented x3.  Normal judgment. No apparent acute distress.    Impression and Recommendations:    Depression, recurrent ([place]) Her PHQ-9 score actually went up slightly but she is only been on the Paxil for about a week so far encouraged her to continue with the 10 mg for an additional week since she is tolerating it great and then increase to 20 mg.  I had like to  see her back in about 3 weeks and that way she will have been on the 20 mg for 2 full weeks.  She has any problems or concerns in the interim please let me know.  She will of gone to her first consultation for therapy/counseling just really encouraged her to keep that appointment I think it could be extraordinarily helpful for her   PHQ9 SCORE ONLY 02/17/2021 01/24/2021 08/07/2019  PHQ-9 Total Score 15 13 6       Time spent in encounter 20 minutes  I discussed the assessment and treatment plan with the patient. The patient was provided an opportunity to ask questions and all were answered. The patient agreed with the plan and demonstrated an understanding of the instructions.   The patient was advised to call back or seek an in-person evaluation if the symptoms worsen or if the condition fails to improve as anticipated.   [name], MD

## 2021-02-17 NOTE — Progress Notes (Signed)
Pt reports that she hasn't been able to tell any difference with starting the medication.

## 2021-02-17 NOTE — Patient Instructions (Signed)
Continue the Paxil 10mg  daily x 1 more week. Then increase to Paxil to 20mg  daily.

## 2021-02-17 NOTE — Assessment & Plan Note (Signed)
Her PHQ-9 score actually went up slightly but she is only been on the Paxil for about a week so far encouraged her to continue with the 10 mg for an additional week since she is tolerating it great and then increase to 20 mg.  I had like to see her back in about 3 weeks and that way she will have been on the 20 mg for 2 full weeks.  She has any problems or concerns in the interim please let me know.  She will of gone to her first consultation for therapy/counseling just really encouraged her to keep that appointment I think it could be extraordinarily helpful for her

## 2021-02-19 ENCOUNTER — Encounter: Payer: Self-pay | Admitting: Family Medicine

## 2021-02-19 DIAGNOSIS — G620 Drug-induced polyneuropathy: Secondary | ICD-10-CM | POA: Diagnosis not present

## 2021-02-19 DIAGNOSIS — R29898 Other symptoms and signs involving the musculoskeletal system: Secondary | ICD-10-CM | POA: Diagnosis not present

## 2021-02-19 DIAGNOSIS — R5381 Other malaise: Secondary | ICD-10-CM | POA: Diagnosis not present

## 2021-03-03 ENCOUNTER — Encounter: Payer: Self-pay | Admitting: Family Medicine

## 2021-03-03 DIAGNOSIS — C772 Secondary and unspecified malignant neoplasm of intra-abdominal lymph nodes: Secondary | ICD-10-CM | POA: Diagnosis not present

## 2021-03-03 DIAGNOSIS — Z79899 Other long term (current) drug therapy: Secondary | ICD-10-CM | POA: Diagnosis not present

## 2021-03-03 DIAGNOSIS — T451X5A Adverse effect of antineoplastic and immunosuppressive drugs, initial encounter: Secondary | ICD-10-CM | POA: Diagnosis not present

## 2021-03-03 DIAGNOSIS — Z5111 Encounter for antineoplastic chemotherapy: Secondary | ICD-10-CM | POA: Diagnosis not present

## 2021-03-03 DIAGNOSIS — D6959 Other secondary thrombocytopenia: Secondary | ICD-10-CM | POA: Diagnosis not present

## 2021-03-03 DIAGNOSIS — C541 Malignant neoplasm of endometrium: Secondary | ICD-10-CM | POA: Diagnosis not present

## 2021-03-03 DIAGNOSIS — C775 Secondary and unspecified malignant neoplasm of intrapelvic lymph nodes: Secondary | ICD-10-CM | POA: Diagnosis not present

## 2021-03-03 DIAGNOSIS — C770 Secondary and unspecified malignant neoplasm of lymph nodes of head, face and neck: Secondary | ICD-10-CM | POA: Diagnosis not present

## 2021-03-03 DIAGNOSIS — M810 Age-related osteoporosis without current pathological fracture: Secondary | ICD-10-CM | POA: Diagnosis not present

## 2021-03-04 ENCOUNTER — Other Ambulatory Visit: Payer: Self-pay | Admitting: *Deleted

## 2021-03-04 MED ORDER — NITROFURANTOIN MACROCRYSTAL 50 MG PO CAPS: 50.0000 mg | ORAL_CAPSULE | Freq: Every day | ORAL | 0 refills | Status: DC

## 2021-03-06 ENCOUNTER — Ambulatory Visit (INDEPENDENT_AMBULATORY_CARE_PROVIDER_SITE_OTHER): Payer: Medicare HMO | Admitting: Family Medicine

## 2021-03-06 ENCOUNTER — Encounter: Payer: Self-pay | Admitting: Family Medicine

## 2021-03-06 ENCOUNTER — Other Ambulatory Visit: Payer: Self-pay

## 2021-03-06 ENCOUNTER — Ambulatory Visit (INDEPENDENT_AMBULATORY_CARE_PROVIDER_SITE_OTHER): Payer: Medicare HMO

## 2021-03-06 VITALS — BP 108/65 | HR 98 | Ht 65.0 in | Wt 145.0 lb

## 2021-03-06 DIAGNOSIS — M810 Age-related osteoporosis without current pathological fracture: Secondary | ICD-10-CM | POA: Diagnosis not present

## 2021-03-06 DIAGNOSIS — R29898 Other symptoms and signs involving the musculoskeletal system: Secondary | ICD-10-CM

## 2021-03-06 DIAGNOSIS — R531 Weakness: Secondary | ICD-10-CM | POA: Diagnosis not present

## 2021-03-06 DIAGNOSIS — D6959 Other secondary thrombocytopenia: Secondary | ICD-10-CM | POA: Diagnosis not present

## 2021-03-06 DIAGNOSIS — C770 Secondary and unspecified malignant neoplasm of lymph nodes of head, face and neck: Secondary | ICD-10-CM | POA: Diagnosis not present

## 2021-03-06 DIAGNOSIS — C772 Secondary and unspecified malignant neoplasm of intra-abdominal lymph nodes: Secondary | ICD-10-CM | POA: Diagnosis not present

## 2021-03-06 DIAGNOSIS — M47816 Spondylosis without myelopathy or radiculopathy, lumbar region: Secondary | ICD-10-CM | POA: Diagnosis not present

## 2021-03-06 DIAGNOSIS — Z5111 Encounter for antineoplastic chemotherapy: Secondary | ICD-10-CM | POA: Diagnosis not present

## 2021-03-06 DIAGNOSIS — F339 Major depressive disorder, recurrent, unspecified: Secondary | ICD-10-CM | POA: Diagnosis not present

## 2021-03-06 DIAGNOSIS — C775 Secondary and unspecified malignant neoplasm of intrapelvic lymph nodes: Secondary | ICD-10-CM | POA: Diagnosis not present

## 2021-03-06 DIAGNOSIS — M47817 Spondylosis without myelopathy or radiculopathy, lumbosacral region: Secondary | ICD-10-CM | POA: Diagnosis not present

## 2021-03-06 DIAGNOSIS — T451X5A Adverse effect of antineoplastic and immunosuppressive drugs, initial encounter: Secondary | ICD-10-CM | POA: Diagnosis not present

## 2021-03-06 DIAGNOSIS — M4696 Unspecified inflammatory spondylopathy, lumbar region: Secondary | ICD-10-CM | POA: Diagnosis not present

## 2021-03-06 DIAGNOSIS — C541 Malignant neoplasm of endometrium: Secondary | ICD-10-CM | POA: Diagnosis not present

## 2021-03-06 DIAGNOSIS — Z79899 Other long term (current) drug therapy: Secondary | ICD-10-CM | POA: Diagnosis not present

## 2021-03-06 MED ORDER — PAROXETINE HCL 20 MG PO TABS: 20.0000 mg | ORAL_TABLET | Freq: Every day | ORAL | 1 refills | Status: DC

## 2021-03-06 NOTE — Assessment & Plan Note (Signed)
Very happy with current regimen. RF sent ot pharmacy.

## 2021-03-06 NOTE — Progress Notes (Signed)
Pt reports that she is unable to bear weight on L leg, she has to lift her leg. She also has pain on the top of her L foot if you squeeze on it. She describes the pain as an achy pain.

## 2021-03-06 NOTE — Progress Notes (Signed)
Established Patient Office Visit  Subjective:  Patient ID: Amy Wilson, female    DOB: 08/10/1951  Age: 70 y.o. MRN: [id number]  CC:  Chief Complaint  Patient presents with  . Follow-up    HPI Amy Wilson presents for left leg pain and weakness.  Has been very weak for about 9 months and is getting gradually worse. Has been going to PT and doing water therapy until she passed out in therapy a couple of weeks ago.  She feels like her left leg feels numb at time and it painful at other times. She has no weakness in her right leg. She is unable to walk safely so uses a wheelchair.   She has bought some foot pedals at home to try to stay active. Now that her chemo is every 2 weeks instead of every 5 weeks she just constantly feels weak and tired.   F/U mood - she is doing really well on the Paxil. No more crying spells and she hasn't had any side effects.  F/U recurrent depression - really happy with the paxil. No S.E. she feels it is really working.    Past Medical History:  Diagnosis Date  . 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 ABDOMINAL HYSTERECTOMY    . kidney stents    . KNEE ARTHROSCOPY Right     Family History  Problem Relation Age of Onset  . Heart disease Father   . Colon cancer Maternal Grandmother   . Stomach cancer Maternal Grandmother     Social History   Socioeconomic History  . Marital status: Divorced    Spouse name: Not on file  . Number of children: 2  . Years of education: 33  . Highest education level: 12th grade  Occupational History  . Occupation: retail    Comment: retired  Tobacco Use  . Smoking status: Never Smoker  . Smokeless tobacco: Never Used  Vaping Use  . Vaping Use: Never used  Substance and Sexual Activity  . Alcohol use: Never  . Drug use: No  . Sexual activity: Never  Other Topics Concern  . Not on file  Social History Narrative   Patient has cancer and doing chemo and  radiation. Stays tired all the time   Social Determinants of Health   Financial Resource Strain: Not on file  Food Insecurity: Not on file  Transportation Needs: Not on file  Physical Activity: Not on file  Stress: Not on file  Social Connections: Not on file  Intimate Partner Violence: Not on file    Outpatient Medications Prior to Visit  Medication Sig Dispense Refill  . calcium carbonate (OS-CAL - DOSED IN MG OF ELEMENTAL CALCIUM) 1250 (500 Ca) MG tablet Take 1 tablet by mouth daily.    Marland Kitchen esomeprazole (NEXIUM) 20 MG capsule Take 10 mg by mouth daily before breakfast. Take 10 mg 30 minutes before breakfast    . gabapentin (NEURONTIN) 100 MG capsule Take 1 capsule (100 mg total) by mouth 3 (three) times daily. 90 capsule 2  . magnesium oxide (MAG-OX) 400 MG tablet Take by mouth.    Marland Kitchen MYRBETRIQ 25 MG TB24 tablet     . nitrofurantoin (MACRODANTIN) 50 MG capsule Take 1 capsule (50 mg total) by mouth at bedtime. For prophylaxis for bladder infection 90 capsule 0  . ondansetron (ZOFRAN) 8 MG tablet Take by mouth.    . oxybutynin (DITROPAN) 5 MG tablet  Take 1 tablet (5 mg total) by mouth 2 (two) times daily. 60 tablet 1  . potassium chloride (KLOR-CON) 10 MEQ tablet Take by mouth.    . senna-docusate (SENOKOT-S) 8.6-50 MG tablet Take 1 tablet by mouth daily.    . tamsulosin (FLOMAX) 0.4 MG CAPS capsule Take by mouth.    . AMBULATORY NON FORMULARY MEDICATION Medication Name: Electronics engineer.  Dx Lower extremity weakness 1 Units 0  . PARoxetine (PAXIL) 10 MG tablet Take 1 tablet (10 mg total) by mouth daily for 8 days, THEN 2 tablets (20 mg total) daily for 22 days. 52 tablet 0   No facility-administered medications prior to visit.    Allergies  Allergen Reactions  . Latex Itching and Rash    ROS Review of Systems    Objective:    Physical Exam Vitals reviewed.  Constitutional:      Appearance: She is well-developed.  HENT:     Head: Normocephalic and atraumatic.  Eyes:      Conjunctiva/sclera: Conjunctivae normal.  Cardiovascular:     Rate and Rhythm: Normal rate.  Pulmonary:     Effort: Pulmonary effort is normal.  Musculoskeletal:     Comments: Unable to flex left hip but did have muscle contraction. Left knee with some mild flexion and extension but not full ROM.  Unable to flex or extend the ankle.   Skin:    General: Skin is dry.     Coloration: Skin is not pale.  Neurological:     Mental Status: She is alert and oriented to person, place, and time.  Psychiatric:        Behavior: Behavior normal.     BP 108/65   Pulse 98   Ht 5\' 5"  (1.651 m)   Wt 145 lb (65.8 kg)   SpO2 100%   BMI 24.13 kg/m  Wt Readings from Last 3 Encounters:  03/06/21 145 lb (65.8 kg)  08/08/20 152 lb (68.9 kg)  07/18/20 158 lb (71.7 kg)     Health Maintenance Due  Topic Date Due  . TETANUS/TDAP  Never done  . MAMMOGRAM  03/04/2019    There are no preventive care reminders to display for this patient.  Lab Results  Component Value Date   TSH 3.70 03/05/2020   Lab Results  Component Value Date   WBC 7.2 03/05/2020   HGB 13.1 03/05/2020   HCT 39.8 03/05/2020   MCV 88.6 03/05/2020   PLT 158 03/05/2020   Lab Results  Component Value Date   NA 138 03/05/2020   K 4.0 03/05/2020   CO2 26 03/05/2020   GLUCOSE 94 03/05/2020   BUN 17 03/05/2020   CREATININE 0.97 (H) 03/05/2020   BILITOT 0.5 03/05/2020   ALKPHOS 81 02/19/2017   AST 16 03/05/2020   ALT 14 03/05/2020   PROT 7.2 03/05/2020   ALBUMIN 4.2 02/19/2017   CALCIUM 9.3 03/05/2020   Lab Results  Component Value Date   CHOL 187 03/05/2020   Lab Results  Component Value Date   HDL 29 (L) 03/05/2020   Lab Results  Component Value Date   LDLCALC 130 (H) 03/05/2020   Lab Results  Component Value Date   TRIG 166 (H) 03/05/2020   Lab Results  Component Value Date   CHOLHDL 6.4 (H) 03/05/2020   Lab Results  Component Value Date   HGBA1C 5.3 08/08/2020      Assessment & Plan:    Problem List Items Addressed This Visit  Other   Depression, recurrent ([place])    Very happy with current regimen. RF sent ot pharmacy.       Relevant Medications   PARoxetine (PAXIL) 20 MG tablet    Other Visit Diagnoses    Left leg weakness    -  Primary   Relevant Orders   DG Lumbar Spine Complete   Nerve conduction test     Left leg weakness - could be from her spine, nerve compession or degenerative nerve disease or neuropathy from her chemotherapy.  Will start with xray of the spine, and EMG study. Consider MRI of lumbar spine as well. Continue to use a wheelchair.   Meds ordered this encounter  Medications  . PARoxetine (PAXIL) 20 MG tablet    Sig: Take 1 tablet (20 mg total) by mouth daily.    Dispense:  90 tablet    Refill:  1    Follow-up: No follow-ups on file.    [name], MD

## 2021-03-11 ENCOUNTER — Other Ambulatory Visit: Payer: Self-pay | Admitting: Family Medicine

## 2021-03-11 DIAGNOSIS — R29898 Other symptoms and signs involving the musculoskeletal system: Secondary | ICD-10-CM

## 2021-03-11 NOTE — Progress Notes (Signed)
MRI ordered

## 2021-03-12 DIAGNOSIS — C772 Secondary and unspecified malignant neoplasm of intra-abdominal lymph nodes: Secondary | ICD-10-CM | POA: Diagnosis not present

## 2021-03-12 DIAGNOSIS — C541 Malignant neoplasm of endometrium: Secondary | ICD-10-CM | POA: Diagnosis not present

## 2021-03-12 DIAGNOSIS — D6959 Other secondary thrombocytopenia: Secondary | ICD-10-CM | POA: Diagnosis not present

## 2021-03-12 DIAGNOSIS — M810 Age-related osteoporosis without current pathological fracture: Secondary | ICD-10-CM | POA: Diagnosis not present

## 2021-03-12 DIAGNOSIS — C770 Secondary and unspecified malignant neoplasm of lymph nodes of head, face and neck: Secondary | ICD-10-CM | POA: Diagnosis not present

## 2021-03-12 DIAGNOSIS — Z79899 Other long term (current) drug therapy: Secondary | ICD-10-CM | POA: Diagnosis not present

## 2021-03-12 DIAGNOSIS — C775 Secondary and unspecified malignant neoplasm of intrapelvic lymph nodes: Secondary | ICD-10-CM | POA: Diagnosis not present

## 2021-03-12 DIAGNOSIS — Z5111 Encounter for antineoplastic chemotherapy: Secondary | ICD-10-CM | POA: Diagnosis not present

## 2021-03-12 DIAGNOSIS — T451X5A Adverse effect of antineoplastic and immunosuppressive drugs, initial encounter: Secondary | ICD-10-CM | POA: Diagnosis not present

## 2021-03-15 ENCOUNTER — Other Ambulatory Visit: Payer: Self-pay

## 2021-03-15 ENCOUNTER — Ambulatory Visit (INDEPENDENT_AMBULATORY_CARE_PROVIDER_SITE_OTHER): Payer: Medicare HMO

## 2021-03-15 DIAGNOSIS — M5126 Other intervertebral disc displacement, lumbar region: Secondary | ICD-10-CM | POA: Diagnosis not present

## 2021-03-15 DIAGNOSIS — R29898 Other symptoms and signs involving the musculoskeletal system: Secondary | ICD-10-CM

## 2021-03-15 DIAGNOSIS — M5116 Intervertebral disc disorders with radiculopathy, lumbar region: Secondary | ICD-10-CM | POA: Diagnosis not present

## 2021-03-15 DIAGNOSIS — M4856XA Collapsed vertebra, not elsewhere classified, lumbar region, initial encounter for fracture: Secondary | ICD-10-CM | POA: Diagnosis not present

## 2021-03-15 DIAGNOSIS — R531 Weakness: Secondary | ICD-10-CM | POA: Diagnosis not present

## 2021-03-15 DIAGNOSIS — M5117 Intervertebral disc disorders with radiculopathy, lumbosacral region: Secondary | ICD-10-CM | POA: Diagnosis not present

## 2021-03-16 DIAGNOSIS — S32000A Wedge compression fracture of unspecified lumbar vertebra, initial encounter for closed fracture: Secondary | ICD-10-CM | POA: Diagnosis not present

## 2021-03-17 ENCOUNTER — Encounter: Payer: Self-pay | Admitting: Family Medicine

## 2021-03-22 ENCOUNTER — Other Ambulatory Visit: Payer: Self-pay | Admitting: Family Medicine

## 2021-03-22 DIAGNOSIS — R351 Nocturia: Secondary | ICD-10-CM

## 2021-03-22 DIAGNOSIS — N3946 Mixed incontinence: Secondary | ICD-10-CM

## 2021-03-27 DIAGNOSIS — K8020 Calculus of gallbladder without cholecystitis without obstruction: Secondary | ICD-10-CM | POA: Diagnosis not present

## 2021-03-27 DIAGNOSIS — I2510 Atherosclerotic heart disease of native coronary artery without angina pectoris: Secondary | ICD-10-CM | POA: Diagnosis not present

## 2021-03-27 DIAGNOSIS — C541 Malignant neoplasm of endometrium: Secondary | ICD-10-CM | POA: Diagnosis not present

## 2021-03-29 ENCOUNTER — Other Ambulatory Visit: Payer: Self-pay | Admitting: Family Medicine

## 2021-03-29 DIAGNOSIS — N3946 Mixed incontinence: Secondary | ICD-10-CM

## 2021-03-29 DIAGNOSIS — R351 Nocturia: Secondary | ICD-10-CM

## 2021-03-31 ENCOUNTER — Other Ambulatory Visit: Payer: Self-pay | Admitting: *Deleted

## 2021-03-31 DIAGNOSIS — M792 Neuralgia and neuritis, unspecified: Secondary | ICD-10-CM | POA: Diagnosis not present

## 2021-04-02 DIAGNOSIS — D6959 Other secondary thrombocytopenia: Secondary | ICD-10-CM | POA: Diagnosis not present

## 2021-04-02 DIAGNOSIS — C775 Secondary and unspecified malignant neoplasm of intrapelvic lymph nodes: Secondary | ICD-10-CM | POA: Diagnosis not present

## 2021-04-02 DIAGNOSIS — C541 Malignant neoplasm of endometrium: Secondary | ICD-10-CM | POA: Diagnosis not present

## 2021-04-02 DIAGNOSIS — C772 Secondary and unspecified malignant neoplasm of intra-abdominal lymph nodes: Secondary | ICD-10-CM | POA: Diagnosis not present

## 2021-04-02 DIAGNOSIS — T451X5A Adverse effect of antineoplastic and immunosuppressive drugs, initial encounter: Secondary | ICD-10-CM | POA: Diagnosis not present

## 2021-04-02 DIAGNOSIS — C770 Secondary and unspecified malignant neoplasm of lymph nodes of head, face and neck: Secondary | ICD-10-CM | POA: Diagnosis not present

## 2021-04-06 ENCOUNTER — Other Ambulatory Visit: Payer: Self-pay | Admitting: Family Medicine

## 2021-04-07 DIAGNOSIS — G894 Chronic pain syndrome: Secondary | ICD-10-CM | POA: Diagnosis not present

## 2021-04-07 DIAGNOSIS — R5381 Other malaise: Secondary | ICD-10-CM | POA: Diagnosis not present

## 2021-04-07 DIAGNOSIS — M792 Neuralgia and neuritis, unspecified: Secondary | ICD-10-CM | POA: Diagnosis not present

## 2021-04-09 DIAGNOSIS — C541 Malignant neoplasm of endometrium: Secondary | ICD-10-CM | POA: Diagnosis not present

## 2021-04-09 DIAGNOSIS — Z5112 Encounter for antineoplastic immunotherapy: Secondary | ICD-10-CM | POA: Diagnosis not present

## 2021-04-09 DIAGNOSIS — C772 Secondary and unspecified malignant neoplasm of intra-abdominal lymph nodes: Secondary | ICD-10-CM | POA: Diagnosis not present

## 2021-04-09 DIAGNOSIS — C775 Secondary and unspecified malignant neoplasm of intrapelvic lymph nodes: Secondary | ICD-10-CM | POA: Diagnosis not present

## 2021-04-09 DIAGNOSIS — C770 Secondary and unspecified malignant neoplasm of lymph nodes of head, face and neck: Secondary | ICD-10-CM | POA: Diagnosis not present

## 2021-04-15 ENCOUNTER — Other Ambulatory Visit: Payer: Self-pay

## 2021-04-15 NOTE — Patient Outreach (Signed)
[facility]) Care Management  04/15/2021  ANTWAN BRIBIESCA [birth year] [id number]   Telephone Screen  Referral Date: 04/15/2021 Referral Source: [facility] Tier 4 Report Referral Reason: "high risk patient" Insurance: [employer]   Outreach attempt # 1 to patient. No answer. RN CM left HIPAA compliant voicemail message along with contact info.     Plan: RN CM will make outreach attempt to patient within 3-4 business days. RN CM will send unsuccessful outreach letter to patient.   Enzo Montgomery, RN,BSN,CCM [place] Management Telephonic Care Management Coordinator Direct Phone: [phone] Toll Free: [phone] Fax: [phone]

## 2021-04-16 DIAGNOSIS — S32000A Wedge compression fracture of unspecified lumbar vertebra, initial encounter for closed fracture: Secondary | ICD-10-CM | POA: Diagnosis not present

## 2021-04-17 ENCOUNTER — Other Ambulatory Visit: Payer: Self-pay

## 2021-04-17 NOTE — Patient Outreach (Signed)
[facility]) Care Management  04/17/2021  Amy Wilson 1951-10-01 [id number]    Telephone Screen  Referral Date: 04/15/2021 Referral Source: [facility] Tier 4 Report Referral Reason: "high risk patient" Insurance: [employer]    Outreach attempt #2 to patient. Call went straight to voicemail.     Plan: RN CM will make outreach attempt to patient within 3-4 business days.   Enzo Montgomery, RN,BSN,CCM [place] Management Telephonic Care Management Coordinator Direct Phone: [phone] Toll Free: [phone] Fax: [phone]

## 2021-04-21 ENCOUNTER — Ambulatory Visit (INDEPENDENT_AMBULATORY_CARE_PROVIDER_SITE_OTHER): Payer: Medicare HMO | Admitting: Family Medicine

## 2021-04-21 ENCOUNTER — Encounter: Payer: Self-pay | Admitting: Family Medicine

## 2021-04-21 ENCOUNTER — Other Ambulatory Visit: Payer: Self-pay

## 2021-04-21 VITALS — BP 120/60 | HR 113 | Ht 65.0 in | Wt 145.0 lb

## 2021-04-21 DIAGNOSIS — R202 Paresthesia of skin: Secondary | ICD-10-CM

## 2021-04-21 DIAGNOSIS — R29898 Other symptoms and signs involving the musculoskeletal system: Secondary | ICD-10-CM

## 2021-04-21 DIAGNOSIS — I10 Essential (primary) hypertension: Secondary | ICD-10-CM | POA: Diagnosis not present

## 2021-04-21 MED ORDER — GABAPENTIN 100 MG PO CAPS: ORAL_CAPSULE | ORAL | 5 refills | Status: DC

## 2021-04-21 NOTE — Patient Outreach (Signed)
[place] [facility]) Care Management  04/21/2021  LIDYA MCCALISTER May 12, 1951 [id number]   Telephone Screen  Referral Date: 04/15/2021 Referral Source: [facility] Tier 4 Report Referral Reason: "high risk patient" Insurance: [employer]   Outreach attempt # 3 to patient-line busy.   Plan: RN CM will make outreach attempt to patient within 3-4 wks if no response from letter mailed to patient.  Enzo Montgomery, RN,BSN,CCM [place] Management Telephonic Care Management Coordinator Direct Phone: [phone] Toll Free: [phone] Fax: [phone]

## 2021-04-21 NOTE — Progress Notes (Signed)
Established Patient Office Visit  Subjective:  Patient ID: Amy Wilson, female    DOB: 03-03-1951  Age: 70 y.o. MRN: [id number]  CC:  Chief Complaint  Patient presents with  . Follow-up   : HPI Amy Wilson presents for follow-up of left leg weakness.  He still continues to have numbness burning and tingling in addition to significant weakness in that left leg.  She does have some motion in fact she just had a recent nerve conduction study performed on April 11 which was essentially normal, no indication of large fiber neuropathy.  Course it does not rule out small nerve fiber neuropathy.  She says that increasing the gabapentin to 3 tabs at bedtime has actually been really helpful she says it does give her some relief of the burning and tingling and she is able to get a little bit better rest.  Past Medical History:  Diagnosis Date  . 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 ABDOMINAL HYSTERECTOMY    . kidney stents    . KNEE ARTHROSCOPY Right     Family History  Problem Relation Age of Onset  . Heart disease Father   . Colon cancer Maternal Grandmother   . Stomach cancer Maternal Grandmother     Social History   Socioeconomic History  . Marital status: Divorced    Spouse name: Not on file  . Number of children: 2  . Years of education: 33  . Highest education level: 12th grade  Occupational History  . Occupation: retail    Comment: retired  Tobacco Use  . Smoking status: Never Smoker  . Smokeless tobacco: Never Used  Vaping Use  . Vaping Use: Never used  Substance and Sexual Activity  . Alcohol use: Never  . Drug use: No  . Sexual activity: Never  Other Topics Concern  . Not on file  Social History Narrative   Patient has cancer and doing chemo and radiation. Stays tired all the time   Social Determinants of Health   Financial Resource Strain: Not on file  Food Insecurity: Not on file  Transportation  Needs: Not on file  Physical Activity: Not on file  Stress: Not on file  Social Connections: Not on file  Intimate Partner Violence: Not on file    Outpatient Medications Prior to Visit  Medication Sig Dispense Refill  . calcium carbonate (OS-CAL - DOSED IN MG OF ELEMENTAL CALCIUM) 1250 (500 Ca) MG tablet Take 1 tablet by mouth daily.    Marland Kitchen esomeprazole (NEXIUM) 20 MG capsule Take 10 mg by mouth daily before breakfast. Take 10 mg 30 minutes before breakfast    . magnesium oxide (MAG-OX) 400 MG tablet Take by mouth.    . nitrofurantoin (MACRODANTIN) 50 MG capsule Take 1 capsule (50 mg total) by mouth at bedtime. For prophylaxis for bladder infection 90 capsule 0  . ondansetron (ZOFRAN) 8 MG tablet Take by mouth.    . oxybutynin (DITROPAN) 5 MG tablet Take 1 tablet by mouth twice daily 60 tablet 1  . PARoxetine (PAXIL) 20 MG tablet Take 1 tablet (20 mg total) by mouth daily. 90 tablet 1  . potassium chloride (KLOR-CON) 10 MEQ tablet Take by mouth.    . senna-docusate (SENOKOT-S) 8.6-50 MG tablet Take 1 tablet by mouth daily.    Marland Kitchen gabapentin (NEURONTIN) 100 MG capsule TAKE 1 CAPSULE BY MOUTH THREE TIMES DAILY 90 capsule 0   No facility-administered  medications prior to visit.    Allergies  Allergen Reactions  . Latex Itching and Rash    ROS Review of Systems    Objective:    Physical Exam Vitals reviewed.  Constitutional:      Appearance: She is well-developed.  HENT:     Head: Normocephalic and atraumatic.  Eyes:     Conjunctiva/sclera: Conjunctivae normal.  Cardiovascular:     Rate and Rhythm: Normal rate.  Pulmonary:     Effort: Pulmonary effort is normal.  Musculoskeletal:     Comments: She is able to extend and flex her knee.  She does have significant weakness at the hip flexor.  Skin:    General: Skin is dry.     Coloration: Skin is not pale.  Neurological:     Mental Status: She is alert and oriented to person, place, and time.  Psychiatric:        Behavior:  Behavior normal.     BP 120/60   Pulse (!) 113   Ht 5\' 5"  (1.651 m)   Wt 145 lb (65.8 kg)   SpO2 97%   BMI 24.13 kg/m  Wt Readings from Last 3 Encounters:  04/21/21 145 lb (65.8 kg)  03/06/21 145 lb (65.8 kg)  08/08/20 152 lb (68.9 kg)     Health Maintenance Due  Topic Date Due  . TETANUS/TDAP  Never done  . MAMMOGRAM  03/04/2019    There are no preventive care reminders to display for this patient.  Lab Results  Component Value Date   TSH 3.70 03/05/2020   Lab Results  Component Value Date   WBC 7.2 03/05/2020   HGB 13.1 03/05/2020   HCT 39.8 03/05/2020   MCV 88.6 03/05/2020   PLT 158 03/05/2020   Lab Results  Component Value Date   NA 138 03/05/2020   K 4.0 03/05/2020   CO2 26 03/05/2020   GLUCOSE 94 03/05/2020   BUN 17 03/05/2020   CREATININE 0.97 (H) 03/05/2020   BILITOT 0.5 03/05/2020   ALKPHOS 81 02/19/2017   AST 16 03/05/2020   ALT 14 03/05/2020   PROT 7.2 03/05/2020   ALBUMIN 4.2 02/19/2017   CALCIUM 9.3 03/05/2020   Lab Results  Component Value Date   CHOL 187 03/05/2020   Lab Results  Component Value Date   HDL 29 (L) 03/05/2020   Lab Results  Component Value Date   LDLCALC 130 (H) 03/05/2020   Lab Results  Component Value Date   TRIG 166 (H) 03/05/2020   Lab Results  Component Value Date   CHOLHDL 6.4 (H) 03/05/2020   Lab Results  Component Value Date   HGBA1C 5.3 08/08/2020      Assessment & Plan:   Problem List Items Addressed This Visit      Cardiovascular and Mediastinum   HYPERTENSION, BENIGN SYSTEMIC   Relevant Orders   B12 and Folate Panel   Vitamin B1   Vitamin B6   COMPLETE METABOLIC PANEL WITH GFR   Lipid panel   VITAMIN D 25 Hydroxy (Vit-D Deficiency, Fractures)    Other Visit Diagnoses    Left leg weakness    -  Primary   Relevant Medications   gabapentin (NEURONTIN) 100 MG capsule   Other Relevant Orders   B12 and Folate Panel   Vitamin B1   Vitamin B6   COMPLETE METABOLIC PANEL WITH GFR    Lipid panel   VITAMIN D 25 Hydroxy (Vit-D Deficiency, Fractures)   Ambulatory referral to Neurology  Paresthesias       Relevant Medications   gabapentin (NEURONTIN) 100 MG capsule   Other Relevant Orders   B12 and Folate Panel   Vitamin B1   Vitamin B6   COMPLETE METABOLIC PANEL WITH GFR   Lipid panel   VITAMIN D 25 Hydroxy (Vit-D Deficiency, Fractures)   Ambulatory referral to Neurology      Left leg weakness/neuropathy discussed increasing gabapentin we can add 100 mg in the morning to the 3 her milligrams at night and then after 1 week if she is doing okay and not experiencing too much sedation she can add an extra capsule midday when to see if this is helpful for her.  I really want her to continue to work on doing her stretches and exercises and strengthening.  I think she would benefit from getting back into formal PT again.  Were also can refer her to neurology to see if they feel like she might need additional work-up.  She did finish up her chemo almost 2 weeks ago and so I am hopeful that when she gets further away from this that might help with the neuropathy as well also 1 to go back and check for deficiencies which she had we have not done in about 5 years to see if that could be contributing and making her symptoms worse even though it is unilateral.  Hypertension also due for CMP and lipid panel so we will get those ordered today as well.  Meds ordered this encounter  Medications  . gabapentin (NEURONTIN) 100 MG capsule    Sig: 1 in AM, 1 midday and 3 at bedtime.    Dispense:  150 capsule    Refill:  5    Follow-up: Return in about 6 weeks (around 06/02/2021) for recheck leg weakness.    [name], MD

## 2021-04-22 DIAGNOSIS — E559 Vitamin D deficiency, unspecified: Secondary | ICD-10-CM | POA: Diagnosis not present

## 2021-04-22 DIAGNOSIS — I10 Essential (primary) hypertension: Secondary | ICD-10-CM | POA: Diagnosis not present

## 2021-04-22 DIAGNOSIS — R5382 Chronic fatigue, unspecified: Secondary | ICD-10-CM | POA: Diagnosis not present

## 2021-04-22 DIAGNOSIS — R531 Weakness: Secondary | ICD-10-CM | POA: Diagnosis not present

## 2021-04-22 DIAGNOSIS — R29898 Other symptoms and signs involving the musculoskeletal system: Secondary | ICD-10-CM | POA: Diagnosis not present

## 2021-04-22 DIAGNOSIS — R202 Paresthesia of skin: Secondary | ICD-10-CM | POA: Diagnosis not present

## 2021-04-22 DIAGNOSIS — R42 Dizziness and giddiness: Secondary | ICD-10-CM | POA: Diagnosis not present

## 2021-04-26 LAB — COMPLETE METABOLIC PANEL WITH GFR
AG Ratio: 1.2 (calc) (ref 1.0–2.5)
ALT: 9 U/L (ref 6–29)
AST: 14 U/L (ref 10–35)
Albumin: 4 g/dL (ref 3.6–5.1)
Alkaline phosphatase (APISO): 73 U/L (ref 37–153)
BUN/Creatinine Ratio: 21 (calc) (ref 6–22)
BUN: 21 mg/dL (ref 7–25)
CO2: 24 mmol/L (ref 20–32)
Calcium: 9.4 mg/dL (ref 8.6–10.4)
Chloride: 103 mmol/L (ref 98–110)
Creat: 0.99 mg/dL — ABNORMAL HIGH (ref 0.60–0.93)
GFR, Est African American: 66 mL/min/{1.73_m2} (ref >=60)
GFR, Est Non African American: 57 mL/min/{1.73_m2} — ABNORMAL LOW (ref >=60)
Globulin: 3.4 g/dL (calc) (ref 1.9–3.7)
Glucose, Bld: 122 mg/dL — ABNORMAL HIGH (ref 65–99)
Potassium: 3.6 mmol/L (ref 3.5–5.3)
Sodium: 139 mmol/L (ref 135–146)
Total Bilirubin: 0.3 mg/dL (ref 0.2–1.2)
Total Protein: 7.4 g/dL (ref 6.1–8.1)

## 2021-04-26 LAB — B12 AND FOLATE PANEL
Folate: 9.6 ng/mL
Vitamin B-12: 447 pg/mL (ref 200–1100)

## 2021-04-26 LAB — VITAMIN B1: Vitamin B1 (Thiamine): 26 nmol/L (ref 8–30)

## 2021-04-26 LAB — LIPID PANEL
Cholesterol: 174 mg/dL (ref <200)
HDL: 39 mg/dL — ABNORMAL LOW (ref >=50)
LDL Cholesterol (Calc): 108 mg/dL (calc) — ABNORMAL HIGH
Non-HDL Cholesterol (Calc): 135 mg/dL (calc) — ABNORMAL HIGH (ref <130)
Total CHOL/HDL Ratio: 4.5 (calc) (ref <5.0)
Triglycerides: 157 mg/dL — ABNORMAL HIGH (ref <150)

## 2021-04-26 LAB — VITAMIN B6: Vitamin B6: 7.8 ng/mL (ref 2.1–21.7)

## 2021-04-26 LAB — VITAMIN D 25 HYDROXY (VIT D DEFICIENCY, FRACTURES): Vit D, 25-Hydroxy: 38 ng/mL (ref 30–100)

## 2021-04-28 ENCOUNTER — Other Ambulatory Visit: Payer: Self-pay | Admitting: Family Medicine

## 2021-04-28 DIAGNOSIS — R351 Nocturia: Secondary | ICD-10-CM

## 2021-04-28 DIAGNOSIS — R202 Paresthesia of skin: Secondary | ICD-10-CM

## 2021-04-28 DIAGNOSIS — R29898 Other symptoms and signs involving the musculoskeletal system: Secondary | ICD-10-CM

## 2021-04-28 DIAGNOSIS — N3946 Mixed incontinence: Secondary | ICD-10-CM

## 2021-04-28 NOTE — Telephone Encounter (Signed)
Order placed.  She can go at her convenience.

## 2021-05-02 ENCOUNTER — Other Ambulatory Visit: Payer: Self-pay | Admitting: Family Medicine

## 2021-05-02 ENCOUNTER — Other Ambulatory Visit: Payer: Self-pay

## 2021-05-02 ENCOUNTER — Ambulatory Visit (INDEPENDENT_AMBULATORY_CARE_PROVIDER_SITE_OTHER): Payer: Medicare HMO

## 2021-05-02 DIAGNOSIS — N135 Crossing vessel and stricture of ureter without hydronephrosis: Secondary | ICD-10-CM | POA: Diagnosis not present

## 2021-05-02 DIAGNOSIS — R29898 Other symptoms and signs involving the musculoskeletal system: Secondary | ICD-10-CM

## 2021-05-02 DIAGNOSIS — R399 Unspecified symptoms and signs involving the genitourinary system: Secondary | ICD-10-CM | POA: Diagnosis not present

## 2021-05-02 DIAGNOSIS — R531 Weakness: Secondary | ICD-10-CM | POA: Diagnosis not present

## 2021-05-02 DIAGNOSIS — M1612 Unilateral primary osteoarthritis, left hip: Secondary | ICD-10-CM | POA: Diagnosis not present

## 2021-05-02 DIAGNOSIS — M85862 Other specified disorders of bone density and structure, left lower leg: Secondary | ICD-10-CM | POA: Diagnosis not present

## 2021-05-02 DIAGNOSIS — M1611 Unilateral primary osteoarthritis, right hip: Secondary | ICD-10-CM | POA: Diagnosis not present

## 2021-05-02 DIAGNOSIS — M1712 Unilateral primary osteoarthritis, left knee: Secondary | ICD-10-CM | POA: Diagnosis not present

## 2021-05-06 NOTE — Telephone Encounter (Signed)
I think getting back to the pool (via PT down the hall) would be fantastic.  Okay to place new referral.  We can also get her in with one of the orthopedist as well just to look over everything and make sure that were not missing anything and happy to place referral for that as well.

## 2021-05-07 NOTE — Telephone Encounter (Signed)
Order signed.

## 2021-05-08 ENCOUNTER — Other Ambulatory Visit: Payer: Self-pay

## 2021-05-08 NOTE — Patient Outreach (Signed)
[facility]) Care Management  05/08/2021  Amy Wilson 09/11/1951 [id number]    Telephone Screen  Referral Date:04/15/2021 Referral Source:[employer] Tier 4 Report Referral Reason:"high risk patient" Insurance:[employer] Medicare    Multiple attempts to establish contact with patient without success. No response from letter mailed to patient. Case is being closed at this time.    Plan: RN CM will close case at this time.   Enzo Montgomery, RN,BSN,CCM [facility] Telephonic Care Management Coordinator Direct Phone: [phone] Toll Free: [phone] Fax: [phone]

## 2021-05-13 ENCOUNTER — Ambulatory Visit: Payer: Self-pay

## 2021-05-14 ENCOUNTER — Other Ambulatory Visit: Payer: Self-pay | Admitting: *Deleted

## 2021-05-14 DIAGNOSIS — R29898 Other symptoms and signs involving the musculoskeletal system: Secondary | ICD-10-CM

## 2021-05-14 DIAGNOSIS — M79672 Pain in left foot: Secondary | ICD-10-CM

## 2021-05-14 DIAGNOSIS — R202 Paresthesia of skin: Secondary | ICD-10-CM

## 2021-05-14 NOTE — Telephone Encounter (Signed)
[place] for referal to ortho and Neuro

## 2021-05-16 DIAGNOSIS — S32000A Wedge compression fracture of unspecified lumbar vertebra, initial encounter for closed fracture: Secondary | ICD-10-CM | POA: Diagnosis not present

## 2021-05-26 ENCOUNTER — Telehealth: Payer: Self-pay | Admitting: *Deleted

## 2021-05-26 NOTE — Telephone Encounter (Signed)
Pt asked about getting into a rehab facility for what's going on with her that can better access what would be better for her going forward.

## 2021-05-27 DIAGNOSIS — R29898 Other symptoms and signs involving the musculoskeletal system: Secondary | ICD-10-CM | POA: Diagnosis not present

## 2021-06-02 ENCOUNTER — Encounter: Payer: Self-pay | Admitting: Family Medicine

## 2021-06-02 ENCOUNTER — Telehealth (INDEPENDENT_AMBULATORY_CARE_PROVIDER_SITE_OTHER): Payer: Medicare HMO | Admitting: Family Medicine

## 2021-06-02 DIAGNOSIS — M79672 Pain in left foot: Secondary | ICD-10-CM | POA: Diagnosis not present

## 2021-06-02 DIAGNOSIS — R29898 Other symptoms and signs involving the musculoskeletal system: Secondary | ICD-10-CM | POA: Diagnosis not present

## 2021-06-02 DIAGNOSIS — R5381 Other malaise: Secondary | ICD-10-CM | POA: Diagnosis not present

## 2021-06-02 DIAGNOSIS — G609 Hereditary and idiopathic neuropathy, unspecified: Secondary | ICD-10-CM | POA: Diagnosis not present

## 2021-06-02 DIAGNOSIS — M792 Neuralgia and neuritis, unspecified: Secondary | ICD-10-CM | POA: Diagnosis not present

## 2021-06-02 NOTE — Assessment & Plan Note (Signed)
Did encourage her to keep the neurology appointment just to see if they would make any further recommendations at this point we may be out of options besides just continuing formal physical therapy which I think is a bit of a struggle to get there and get back 3 times a week.  Even though she does feel like it somewhat helpful we discussed the neck step would be to consider something like a scooter since she is unable to walk currently that we did discuss that she would have to be able to transition to be able to use a scooter which means she would have to make sure that her opposite leg is strong enough to be able to transition.

## 2021-06-02 NOTE — Progress Notes (Signed)
Spoke w/Amy Wilson she reports that she is still unable to walk. She would like to get into a rehab facility that she can stay to help her to build up her strength in her leg. Wanted to know if this is something that Dr. Madilyn Fireman can help with.

## 2021-06-02 NOTE — Progress Notes (Signed)
Virtual Visit via Video Note  I connected with Amy Wilson on 06/02/21 at  2:00 PM EDT by a video enabled telemedicine application and verified that I am speaking with the correct person using two identifiers.   I discussed the limitations of evaluation and management by telemedicine and the availability of in person appointments. The patient expressed understanding and agreed to proceed.  Patient location: at home Provider location: in office  Subjective:    CC: leg weakness  HPI: F/U Left leg weakness.  "Her symptoms have not improved and have been unchanged for approximately 1 year. 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 She has undergone some evaluation with her primary care to include x-rays of the left lower extremity, an EMG, and an MRI of the lumbar spine. These tests were unrevealing and did not demonstrate lumbar spine metastatic disease."    The addition of the gabapentin in AM and at noon has been helpful in controlling her pain.  She also takes 3 tabs at bedtime.  So she does at least feel like that is been helpful.  She did start back doing some aquatic therapy but says that it is really burdensome on her daughter to get her there and then its an hour and she has to come back and pick her up.  She wonders if she would benefit from being in some type of rehab facility for a couple of weeks to really try to get some forward motion on getting a little stronger.  She does have a neurology appointment coming up next week.  She did see the orthopedist and they felt like there was nothing that they could do to help her.  Past medical history, Surgical history, Family history not pertinant except as noted below, Social history, Allergies, and medications have been entered into the medical record, reviewed, and corrections made.    Objective:    General: Speaking clearly in complete sentences  without any shortness of breath.  Alert and oriented x3.  Normal judgment. No apparent acute distress.    Impression and Recommendations:    Left leg weakness Did encourage her to keep the neurology appointment just to see if they would make any further recommendations at this point we may be out of options besides just continuing formal physical therapy which I think is a bit of a struggle to get there and get back 3 times a week.  Even though she does feel like it somewhat helpful we discussed the neck step would be to consider something like a scooter since she is unable to walk currently that we did discuss that she would have to be able to transition to be able to use a scooter which means she would have to make sure that her opposite leg is strong enough to be able to transition.      No orders of the defined types were placed in this encounter.   No orders of the defined types were placed in this encounter.    I discussed the assessment and treatment plan with the patient. The patient was provided an opportunity to ask questions and all were answered. The patient agreed with the plan and demonstrated an understanding of the instructions.   The patient was advised to call back or seek an in-person evaluation if the symptoms worsen or if the condition fails to improve as anticipated.   [name], MD

## 2021-06-05 ENCOUNTER — Telehealth: Payer: Self-pay | Admitting: *Deleted

## 2021-06-05 DIAGNOSIS — M5417 Radiculopathy, lumbosacral region: Secondary | ICD-10-CM | POA: Diagnosis not present

## 2021-06-05 DIAGNOSIS — R269 Unspecified abnormalities of gait and mobility: Secondary | ICD-10-CM | POA: Diagnosis not present

## 2021-06-05 DIAGNOSIS — R634 Abnormal weight loss: Secondary | ICD-10-CM | POA: Diagnosis not present

## 2021-06-05 DIAGNOSIS — M79672 Pain in left foot: Secondary | ICD-10-CM | POA: Diagnosis not present

## 2021-06-05 DIAGNOSIS — R531 Weakness: Secondary | ICD-10-CM | POA: Diagnosis not present

## 2021-06-05 DIAGNOSIS — E559 Vitamin D deficiency, unspecified: Secondary | ICD-10-CM | POA: Diagnosis not present

## 2021-06-05 DIAGNOSIS — G609 Hereditary and idiopathic neuropathy, unspecified: Secondary | ICD-10-CM | POA: Diagnosis not present

## 2021-06-05 DIAGNOSIS — R202 Paresthesia of skin: Secondary | ICD-10-CM | POA: Diagnosis not present

## 2021-06-05 DIAGNOSIS — G603 Idiopathic progressive neuropathy: Secondary | ICD-10-CM | POA: Diagnosis not present

## 2021-06-05 DIAGNOSIS — G5601 Carpal tunnel syndrome, right upper limb: Secondary | ICD-10-CM | POA: Diagnosis not present

## 2021-06-05 NOTE — Telephone Encounter (Signed)
Spoke w/pt and she reports that she found a rehab place that she can go to she stated that she will need Dr. Madilyn Fireman to fill out an FL2, and provide the needed OV notes ect.   She stated that she was seen by Neuro today and they conducted another nerve conduction study? They felt that she would benefit by being in a rehab facility also.

## 2021-06-10 ENCOUNTER — Telehealth: Payer: Self-pay | Admitting: *Deleted

## 2021-06-10 NOTE — Chronic Care Management (AMB) (Signed)
  Chronic Care Management   Note  06/10/2021 Name: Amy Wilson MRN: [id number] DOB: Dec 25, 1950  Amy Wilson is a 70 y.o. year old female who is a primary care patient of Madilyn Fireman, Rene Kocher, MD. I reached out to Amy Wilson by phone today in response to a referral sent by Ms. Joellen Jersey, MD.     Ms. Mcswain was given information about Chronic Care Management services today including:  CCM service includes personalized support from designated clinical staff supervised by her physician, including individualized plan of care and coordination with other care providers 24/7 contact phone numbers for assistance for urgent and routine care needs. Service will only be billed when office clinical staff spend 20 minutes or more in a month to coordinate care. Only one practitioner may furnish and bill the service in a calendar month. The patient may stop CCM services at any time (effective at the end of the month) by phone call to the office staff. The patient will be responsible for cost sharing (co-pay) of up to 20% of the service fee (after annual deductible is met).  Patient agreed to services and verbal consent obtained.   Follow up plan: Telephone appointment with care management team member scheduled for:06/11/2021  [facility]

## 2021-06-11 ENCOUNTER — Ambulatory Visit: Payer: Medicare HMO

## 2021-06-11 DIAGNOSIS — C541 Malignant neoplasm of endometrium: Secondary | ICD-10-CM

## 2021-06-11 DIAGNOSIS — R29898 Other symptoms and signs involving the musculoskeletal system: Secondary | ICD-10-CM

## 2021-06-11 DIAGNOSIS — F339 Major depressive disorder, recurrent, unspecified: Secondary | ICD-10-CM

## 2021-06-12 NOTE — Chronic Care Management (AMB) (Signed)
Chronic Care Management   CCM RN Visit Note  06/12/2021 Name: MASSA PE MRN: [id number] DOB: 04-Feb-1951  Subjective: Amy Wilson is a 70 y.o. year old female who is a primary care patient of Metheney, Rene Kocher, MD. The care management team was consulted for assistance with disease management and care coordination needs.    Engaged with patient by telephone for initial visit in response to provider referral for case management and/or care coordination services.   Consent to Services:  The patient was given the following information about Chronic Care Management services today, agreed to services, and gave verbal consent: 1. CCM service includes personalized support from designated clinical staff supervised by the primary care provider, including individualized plan of care and coordination with other care providers 2. 24/7 contact phone numbers for assistance for urgent and routine care needs. 3. Service will only be billed when office clinical staff spend 20 minutes or more in a month to coordinate care. 4. Only one practitioner may furnish and bill the service in a calendar month. 5.The patient may stop CCM services at any time (effective at the end of the month) by phone call to the office staff. 6. The patient will be responsible for cost sharing (co-pay) of up to 20% of the service fee (after annual deductible is met). Patient agreed to services and consent obtained.  Patient agreed to services and verbal consent obtained.   Assessment: Review of patient past medical history, allergies, medications, health status, including review of consultants reports, laboratory and other test data, was performed as part of comprehensive evaluation and provision of chronic care management services.   SDOH (Social Determinants of Health) assessments and interventions performed:  SDOH Interventions    Flowsheet Row Most Recent Value  SDOH Interventions   Food Insecurity Interventions  Intervention Not Indicated  Transportation Interventions Intervention Not Indicated  Depression Interventions/Treatment  Medication        CCM Care Plan  Allergies  Allergen Reactions   Latex Itching and Rash    Outpatient Encounter Medications as of 06/11/2021  Medication Sig Note   calcium carbonate (OS-CAL - DOSED IN MG OF ELEMENTAL CALCIUM) 1250 (500 Ca) MG tablet Take 1 tablet by mouth daily.    esomeprazole (NEXIUM) 20 MG capsule Take 10 mg by mouth daily before breakfast. Take 10 mg 30 minutes before breakfast 06/11/2021: Reports takes one 20 mg capsule every morning   gabapentin (NEURONTIN) 100 MG capsule 1 in AM, 1 midday and 3 at bedtime. 06/11/2021: Reports takes 300 mg capsule 5 times/day.   magnesium oxide (MAG-OX) 400 MG tablet Take by mouth.    nitrofurantoin (MACRODANTIN) 50 MG capsule Take 1 capsule (50 mg total) by mouth at bedtime. For prophylaxis for bladder infection    ondansetron (ZOFRAN) 8 MG tablet Take by mouth. 06/11/2021: Reports takes prn   oxybutynin (DITROPAN) 5 MG tablet Take 1 tablet by mouth twice daily    PARoxetine (PAXIL) 20 MG tablet Take 1 tablet (20 mg total) by mouth daily.    potassium chloride (KLOR-CON) 10 MEQ tablet Take by mouth. 06/11/2021: Reports takes 1 tablet every day   senna-docusate (SENOKOT-S) 8.6-50 MG tablet Take 1 tablet by mouth daily. 06/11/2021: Reports takes as needed   No facility-administered encounter medications on file as of 06/11/2021.    Patient Active Problem List   Diagnosis Date Noted   Left leg weakness 06/02/2021   Recurrent UTI 11/27/2020   Neutropenia associated with infection ([place]) 11/23/2020   Mixed  stress and urge urinary incontinence 10/22/2020   Generalized weakness 10/22/2020   Pancytopenia due to antineoplastic chemotherapy ([place]) 10/13/2020   Cerebellar ataxia in diseases classified elsewhere ([place]) 08/08/2020   Retroperitoneal lymphadenopathy 08/08/2020   Peripheral neuropathic pain 07/22/2020   GERD  (gastroesophageal reflux disease) 07/03/2020   Stage 3b chronic kidney disease ([place]) 07/03/2020   Bilateral ureteral obstruction 05/27/2020   Hydronephrosis 05/18/2019   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[place]) 05/03/2019   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[place]) 05/03/2019   Endometrial adenocarcinoma ([place]) 03/03/2019   Closed compression fracture of body of L1 vertebra ([place]) 02/24/2019   Age-related osteoporosis with current pathological fracture with routine healing 02/24/2019   Fall 02/23/2019   Moderate to severe pulmonary hypertension ([place]) 02/21/2019   Pelvic mass 02/20/2019   Hiatal hernia 02/20/2019   Fibroid uterus 02/20/2019   Bladder wall thickening 02/20/2019   Aortic atherosclerosis ([place]) 02/20/2019   Obesity (BMI 30-39.9) 10/05/2014   ARM PAIN, LEFT 10/13/2010   INSOMNIA 05/13/2009   DIZZINESS 09/13/2007   Hyperlipidemia 08/19/2007   Depression, recurrent ([place]) 09/28/2006   HYPERTENSION, BENIGN SYSTEMIC 09/28/2006   MENOPAUSAL SYNDROME 09/28/2006   VASOVAGAL SYNCOPE 09/28/2006   FATIGUE/MALAISE 09/28/2006    Conditions to be addressed/monitored: fall risk/prevention, mobility issues, depression  Care Plan : General Plan of Care (Adult)  Updates made by Luretha Rued, RN since 06/12/2021 12:00 AM     Problem: Development of [place] for self management of health in a patient with history of endometrial cancer; limited mobility; fall risk   Priority: High     Long-Range Goal: Self-Management Plan Developed   Start Date: 06/11/2021  Expected End Date: 09/11/2021  Priority: High  Note:   Current Barriers:  Knowledge Deficits related to long term care plan for management of health in a patient with a history of metastatic endometrial cancer, limited mobility and fall risk, left leg weakness, depression, kidney stent, Chronic pain syndrome and HTN. Chronic pain syndrome managed by [facility].-Stable per last visit on  06/02/21. Client reports HTN is not an issue and reports she has been taken off all HTN medications. Declines education regarding HTN management. Client reports she lives with her daughter and son- [place]. Daughter works. She reports she spends most of her day in bed due to inability to move her left leg. She reports following chemotherapy treatments she started having mobility issues. Her daughter assist her with bathing and transfers. Client wears undergarments and is reports she gets up and transfers only with assistance. She states, "I don't walk, I can't walk". Client expresses it would take some of the stress off her daughter if she were more mobile and independent. Client declines information on personal care assistance at this time. PHQ2=6/PHQ9=11. She declines referral for counseling resources adding "I have tried counseling and it did not work for me". She reports she is taking her medications as prescribed. She states her primary goal is to go to an inpatient rehab so that she can become more mobile and independent again.  Care Coordination needs related to inpatient rehabilitation placement Unable to independently perform ADL's, transfer, ambulate. Nurse Case Manager Clinical Goal(s):  patient will work with CM clinical social worker regarding community resource needs the patient will demonstrate ongoing self health-care management ability Patient will work with providers as recommended Interventions:  1:1 collaboration with Hali Marry, MD regarding development and update of comprehensive plan of care as evidenced by provider attestation and co-signature  Inter-disciplinary care team collaboration (see longitudinal plan of care) Evaluation of current treatment plan related to patient's health and patient's adherence to plan as established by provider. Provided education to patient re: fall prevention strategies Reviewed medications with patient Reviewed scheduled/upcoming provider  appointments including: 06/16/21 Urology; 06/30/21 surgical services re: STENT change; 07/07/21 [facility]; 07/23/21 Dr. Queen Slough Social Work referral for assistance client with inpatient rehabilitation application Discussed plans with patient for ongoing care management follow up and provided patient with direct contact information for care management team Discussed the importance of good skin to prevent skin breakdown. Denies any skin issues. Discussed fall prevention strategies due to client at risk for falls due to left leg weakness. Patient Goals/Self-Care Activities - attend all scheduled provider appointments - take medications as prescribed - call provider office for new concerns or questions - work with LCSW to address care coordination needs and work with the clinical team to address health care and disease management related needs.   - review education material on fall prevention strategy. Plan to discuss at next telephone call - follow-up on any referrals for help I am given - think ahead to make sure my need does not become an emergency - Keep a note pad and pen by the phone make a note about what I need or questions  - have a back-up plan - make a list of family or friends that I can call as needed Follow Up Plan: Telephone follow up appointment with care management team member scheduled for: 07/02/21. The patient has been provided with contact information for the care management team and has been advised to call with any health related questions or concerns.      Plan:Telephone follow up appointment with care management team member scheduled for:  07/02/21 and The patient has been provided with contact information for the care management team and has been advised to call with any health related questions or concerns.   [name], RN, MSN, BSN, CCM Care Management Coordinator [facility] [phone]

## 2021-06-12 NOTE — Patient Instructions (Addendum)
Visit Information: Thank you for taking the time to speak with me today.   PATIENT GOALS:   Goals Addressed             This Visit's Progress    Develop long term care plan for managment of health       Timeframe:  Long-Range Goal Priority:  High Start Date:  06/11/21                           Expected End Date: 09/11/21                      Follow Up Date 07/02/21    - attend all scheduled provider appointments - take medications as prescribed - call provider office for new concerns or questions - work with LCSW to address care coordination needs and work with the clinical team to address health care and disease management related needs.   - review education material on fall prevention strategy. Plan to discuss at next telephone call - follow-up on any referrals for help I am given - think ahead to make sure my need does not become an emergency - Keep a note pad and pen by the phone make a note about what I need or questions  - have a back-up plan - make a list of family or friends that I can call as needed         Consent to CCM Services: Ms. Amy Wilson was given information about Chronic Care Management services today including:  CCM service includes personalized support from designated clinical staff supervised by her physician, including individualized plan of care and coordination with other care providers 24/7 contact phone numbers for assistance for urgent and routine care needs. Service will only be billed when office clinical staff spend 20 minutes or more in a month to coordinate care. Only one practitioner may furnish and bill the service in a calendar month. The patient may stop CCM services at any time (effective at the end of the month) by phone call to the office staff. The patient will be responsible for cost sharing (co-pay) of up to 20% of the service fee (after annual deductible is met).  Patient agreed to services and verbal consent obtained.   Patient  verbalizes understanding of instructions provided today and agrees to view in [place].   Telephone follow up appointment with care management team member scheduled for: 07/02/21 The patient has been provided with contact information for the care management team and has been advised to call with any health related questions or concerns.   [name], RN, MSN, BSN, CCM Care Management Coordinator [facility] [phone]   CLINICAL CARE PLAN: Patient Care Plan: General Plan of Care (Adult)     Problem Identified: Development of [place] for self management of health in a patient with history of endometrial cancer; limited mobility; fall risk   Priority: High     Long-Range Goal: Self-Management Plan Developed   Start Date: 06/11/2021  Expected End Date: 09/11/2021  Priority: High  Note:   Current Barriers:  Knowledge Deficits related to long term care plan for management of health in a patient with a history of metastatic endometrial cancer, limited mobility and fall risk, left leg weakness, depression, kidney stent, Chronic pain syndrome and HTN. Chronic pain syndrome managed by [facility].-Stable per last visit on 06/02/21. Client reports HTN is not an issue and  reports she has been taken off all HTN medications. Declines education regarding HTN management. Client reports she lives with her daughter and son- [place]. Daughter works. She reports she spends most of her day in bed due to inability to move her left leg. She reports following chemotherapy treatments she started having mobility issues. Her daughter assist her with bathing and transfers. Client wears undergarments and is reports she gets up and transfers only with assistance. She states, "I don't walk, I can't walk". Client expresses it would take some of the stress off her daughter if she were more mobile and independent. Client declines information on personal care assistance at this time.  PHQ2=6/PHQ9=11. She declines referral for counseling resources adding "I have tried counseling and it did not work for me". She reports she is taking her medications as prescribed. She states her primary goal is to go to an inpatient rehab so that she can become more mobile and independent again.  Care Coordination needs related to inpatient rehabilitation placement Unable to independently perform ADL's, transfer, ambulate. Nurse Case Manager Clinical Goal(s):  patient will work with CM clinical social worker regarding community resource needs the patient will demonstrate ongoing self health-care management ability Patient will work with providers as recommended Interventions:  1:1 collaboration with Hali Marry, MD regarding development and update of comprehensive plan of care as evidenced by provider attestation and co-signature Inter-disciplinary care team collaboration (see longitudinal plan of care) Evaluation of current treatment plan related to patient's health and patient's adherence to plan as established by provider. Provided education to patient re: fall prevention strategies Reviewed medications with patient Reviewed scheduled/upcoming provider appointments including: 06/16/21 Urology; 06/30/21 surgical services re: STENT change; 07/07/21 [facility]; 07/23/21 Dr. Queen Slough Social Work referral for assistance client with inpatient rehabilitation application Discussed plans with patient for ongoing care management follow up and provided patient with direct contact information for care management team Discussed the importance of good skin to prevent skin breakdown. Denies any skin issues. Discussed fall prevention strategies due to client at risk for falls due to left leg weakness. Patient Goals/Self-Care Activities - attend all scheduled provider appointments - take medications as prescribed - call provider office for new concerns or questions - work with LCSW  to address care coordination needs and work with the clinical team to address health care and disease management related needs.   - review education material on fall prevention strategy. Plan to discuss at next telephone call - follow-up on any referrals for help I am given - think ahead to make sure my need does not become an emergency - Keep a note pad and pen by the phone make a note about what I need or questions  - have a back-up plan - make a list of family or friends that I can call as needed Follow Up Plan: Telephone follow up appointment with care management team member scheduled for: 07/02/21. The patient has been provided with contact information for the care management team and has been advised to call with any health related questions or concerns.

## 2021-06-13 ENCOUNTER — Ambulatory Visit: Payer: Self-pay

## 2021-06-13 DIAGNOSIS — C541 Malignant neoplasm of endometrium: Secondary | ICD-10-CM

## 2021-06-13 DIAGNOSIS — R29898 Other symptoms and signs involving the musculoskeletal system: Secondary | ICD-10-CM

## 2021-06-13 DIAGNOSIS — F339 Major depressive disorder, recurrent, unspecified: Secondary | ICD-10-CM

## 2021-06-13 NOTE — Patient Instructions (Signed)
Social Worker Visit Information  Goals we discussed today:   Goals Addressed             This Visit's Progress    Mobility and Independence Optimized       Timeframe:  Short-Term Goal Priority:  High Start Date: 6.24.22                            Expected End Date: 7.24.22                       Patient Goals/Self-Care Activities patient will: .  - work with SW regarding SNF placement for inpatient rehab          Materials provided: Verbal education about placement process provided by phone  Ms. Leanos was given information about Chronic Care Management services today including:  CCM service includes personalized support from designated clinical staff supervised by her physician, including individualized plan of care and coordination with other care providers 24/7 contact phone numbers for assistance for urgent and routine care needs. Service will only be billed when office clinical staff spend 20 minutes or more in a month to coordinate care. Only one practitioner may furnish and bill the service in a calendar month. The patient may stop CCM services at any time (effective at the end of the month) by phone call to the office staff. The patient will be responsible for cost sharing (co-pay) of up to 20% of the service fee (after annual deductible is met).  Patient agreed to services and verbal consent obtained.   Patient verbalizes understanding of instructions provided today and agrees to view in [place].   Follow up plan: SW will follow up with patient by phone over the next 5 days  Daneen Schick, BSW, CDP Social Worker, Certified Dementia Practitioner [facility] [phone]

## 2021-06-13 NOTE — Chronic Care Management (AMB) (Signed)
Chronic Care Management    Social Work Note  06/13/2021 Name: ANETT RANKER MRN: [id number] DOB: 1951-11-08  [name] is a 70 y.o. year old female who is a primary care patient of Metheney, Rene Kocher, MD. The CCM team was consulted to assist the patient with chronic disease management and/or care coordination needs related to:  placement into rehab facility to address left leg weakness .   Engaged with patient by telephone for initial visit in response to provider referral for social work chronic care management and care coordination services.   Consent to Services:  The patient was given the following information about Chronic Care Management services today, agreed to services, and gave verbal consent: 1. CCM service includes personalized support from designated clinical staff supervised by the primary care provider, including individualized plan of care and coordination with other care providers 2. 24/7 contact phone numbers for assistance for urgent and routine care needs. 3. Service will only be billed when office clinical staff spend 20 minutes or more in a month to coordinate care. 4. Only one practitioner may furnish and bill the service in a calendar month. 5.The patient may stop CCM services at any time (effective at the end of the month) by phone call to the office staff. 6. The patient will be responsible for cost sharing (co-pay) of up to 20% of the service fee (after annual deductible is met). Patient agreed to services and consent obtained.  Patient agreed to services and consent obtained.   Assessment: Review of patient past medical history, allergies, medications, and health status, including review of relevant consultants reports was performed today as part of a comprehensive evaluation and provision of chronic care management and care coordination services.     SDOH (Social Determinants of Health) assessments and interventions performed:    Advanced Directives Status:  Not addressed in this encounter.  CCM Care Plan  Allergies  Allergen Reactions   Latex Itching and Rash    Outpatient Encounter Medications as of 06/13/2021  Medication Sig Note   calcium carbonate (OS-CAL - DOSED IN MG OF ELEMENTAL CALCIUM) 1250 (500 Ca) MG tablet Take 1 tablet by mouth daily.    esomeprazole (NEXIUM) 20 MG capsule Take 10 mg by mouth daily before breakfast. Take 10 mg 30 minutes before breakfast 06/11/2021: Reports takes one 20 mg capsule every morning   gabapentin (NEURONTIN) 100 MG capsule 1 in AM, 1 midday and 3 at bedtime. 06/11/2021: Reports takes 300 mg capsule 5 times/day.   magnesium oxide (MAG-OX) 400 MG tablet Take by mouth.    nitrofurantoin (MACRODANTIN) 50 MG capsule Take 1 capsule (50 mg total) by mouth at bedtime. For prophylaxis for bladder infection    ondansetron (ZOFRAN) 8 MG tablet Take by mouth. 06/11/2021: Reports takes prn   oxybutynin (DITROPAN) 5 MG tablet Take 1 tablet by mouth twice daily    PARoxetine (PAXIL) 20 MG tablet Take 1 tablet (20 mg total) by mouth daily.    potassium chloride (KLOR-CON) 10 MEQ tablet Take by mouth. 06/11/2021: Reports takes 1 tablet every day   senna-docusate (SENOKOT-S) 8.6-50 MG tablet Take 1 tablet by mouth daily. 06/11/2021: Reports takes as needed   No facility-administered encounter medications on file as of 06/13/2021.    Patient Active Problem List   Diagnosis Date Noted   Left leg weakness 06/02/2021   Recurrent UTI 11/27/2020   Neutropenia associated with infection ([place]) 11/23/2020   Mixed stress and urge urinary incontinence 10/22/2020   Generalized  weakness 10/22/2020   Pancytopenia due to antineoplastic chemotherapy ([place]) 10/13/2020   Cerebellar ataxia in diseases classified elsewhere ([place]) 08/08/2020   Retroperitoneal lymphadenopathy 08/08/2020   Peripheral neuropathic pain 07/22/2020   GERD (gastroesophageal reflux disease) 07/03/2020   Stage 3b chronic kidney disease ([place]) 07/03/2020   Bilateral  ureteral obstruction 05/27/2020   Hydronephrosis 05/18/2019   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[place]) 05/03/2019   Endometrial adenocarcinoma ([place]) 03/03/2019   Closed compression fracture of body of L1 vertebra ([place]) 02/24/2019   Age-related osteoporosis with current pathological fracture with routine healing 02/24/2019   Fall 02/23/2019   Moderate to severe pulmonary hypertension ([place]) 02/21/2019   Pelvic mass 02/20/2019   Hiatal hernia 02/20/2019   Fibroid uterus 02/20/2019   Bladder wall thickening 02/20/2019   Aortic atherosclerosis ([place]) 02/20/2019   Obesity (BMI 30-39.9) 10/05/2014   ARM PAIN, LEFT 10/13/2010   INSOMNIA 05/13/2009   DIZZINESS 09/13/2007   Hyperlipidemia 08/19/2007   Depression, recurrent ([place]) 09/28/2006   HYPERTENSION, BENIGN SYSTEMIC 09/28/2006   MENOPAUSAL SYNDROME 09/28/2006   VASOVAGAL SYNCOPE 09/28/2006   FATIGUE/MALAISE 09/28/2006    Conditions to be addressed/monitored: Depression and Endometrial Adenocarcinoma ;  Rehab needs due to left left weakness  Care Plan : Social Work [place]  Updates made by Daneen Schick since 06/13/2021 12:00 AM     Problem: Mobility and Independence      Goal: Mobility and Independence Optimized   Start Date: 06/13/2021  Expected End Date: 07/13/2021  This Visit's Progress: On track  Priority: High  Note:   Current Barriers:  Chronic disease management support and education needs related to Depression, Endometrial Adenocarcinoma, and left leg weakness ADL IADL limitations - 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 Ineffective past interventions including home health and aquatic therapy  Social Worker Clinical Goal(s):  patient will work with SW to identify and address any acute and/or chronic care coordination needs related to the self health management of  Depression, Endometrial  Adenocarcinoma, and left leg weakness   Patient will work with SW to identify options for inpatient rehab as desired by the patient  SW Interventions:  Inter-disciplinary care team collaboration (see longitudinal plan of care) Collaboration with Hali Marry, MD regarding development and update of comprehensive plan of care as evidenced by provider attestation and co-signature Collaboration with [place] who indicates patient is interested in working with SW to be placed in a local SNF for inpatient rehab Successful outbound call placed to the patient to assist with care coordination needs Discussed the patient has been experiencing neuropathy since beginning chemo therapy and is now unable to use her left leg  Reviewed patient has attempted to regain mobility with home health PT as well as outpatient aquatic therapy Patient has recently been evaluated by Ortho who deferred to neurology - patient reports she was seen by neurology who agreed inpatient rehab may be beneficial Discussed the patient has been in contact with [facility] and [facility] in [place] and was directed to have her primary care provider send and FL2 and H&P for review Patient reports she received a call from [facility] today and was informed they received information from the providers office. Patient stated "I don't know what I have to do next" Advised the patient SW would contact [facility] to assess status of paperwork and assist as needed Unsuccessful outbound  call placed to [facility] [phone]) voice message left requesting a return call from the admissions director Successful outbound call placed to the patient to advise SW has left a voice message for the admissions director and will follow up Monday if a return call is not received Discussed follow up with SW to address placement needs while patient remains actively involved with  RN Case  Manager  to address care management needs Collaboration with [place] regarding intervention and plan  Patient Goals/Self-Care Activities patient will:   -  work with SW regarding SNF placement for inpatient rehab  Follow Up Plan:  SW will follow up with the patient over the next 5 days       Follow Up Plan: SW will follow up with patient by phone over the next 5 days      Daneen Schick, BSW, CDP Social Worker, Certified Dementia Practitioner [place] [facility] [place] [facility] [phone]  Total time spent performing care coordination and/or care management activities with the patient by phone or face to face = 110 minutes.

## 2021-06-13 NOTE — Chronic Care Management (AMB) (Signed)
Erroneous encounter

## 2021-06-16 ENCOUNTER — Ambulatory Visit: Payer: Self-pay

## 2021-06-16 DIAGNOSIS — F339 Major depressive disorder, recurrent, unspecified: Secondary | ICD-10-CM

## 2021-06-16 DIAGNOSIS — C541 Malignant neoplasm of endometrium: Secondary | ICD-10-CM

## 2021-06-16 DIAGNOSIS — S32000A Wedge compression fracture of unspecified lumbar vertebra, initial encounter for closed fracture: Secondary | ICD-10-CM | POA: Diagnosis not present

## 2021-06-16 DIAGNOSIS — N39498 Other specified urinary incontinence: Secondary | ICD-10-CM | POA: Diagnosis not present

## 2021-06-16 DIAGNOSIS — Z01818 Encounter for other preprocedural examination: Secondary | ICD-10-CM | POA: Diagnosis not present

## 2021-06-16 DIAGNOSIS — R29898 Other symptoms and signs involving the musculoskeletal system: Secondary | ICD-10-CM

## 2021-06-16 NOTE — Chronic Care Management (AMB) (Signed)
Chronic Care Management    Social Work Note  06/16/2021 Name: Amy Wilson MRN: [id number] DOB: Feb 28, 1951  Amy Wilson is a 70 y.o. year old female who is a primary care patient of Metheney, Rene Kocher, MD. The CCM team was consulted to assist the patient with chronic disease management and/or care coordination needs related to: Level of Care Concerns.   Engaged with patient by telephone for follow up visit in response to provider referral for social work chronic care management and care coordination services.   Consent to Services:  The patient was given information about Chronic Care Management services, agreed to services, and gave verbal consent prior to initiation of services.  Please see initial visit note for detailed documentation.   Patient agreed to services and consent obtained.   Assessment: Review of patient past medical history, allergies, medications, and health status, including review of relevant consultants reports was performed today as part of a comprehensive evaluation and provision of chronic care management and care coordination services.     SDOH (Social Determinants of Health) assessments and interventions performed:    Advanced Directives Status: Not addressed in this encounter.  CCM Care Plan  Allergies  Allergen Reactions   Latex Itching and Rash    Outpatient Encounter Medications as of 06/16/2021  Medication Sig Note   calcium carbonate (OS-CAL - DOSED IN MG OF ELEMENTAL CALCIUM) 1250 (500 Ca) MG tablet Take 1 tablet by mouth daily.    esomeprazole (NEXIUM) 20 MG capsule Take 10 mg by mouth daily before breakfast. Take 10 mg 30 minutes before breakfast 06/11/2021: Reports takes one 20 mg capsule every morning   gabapentin (NEURONTIN) 100 MG capsule 1 in AM, 1 midday and 3 at bedtime. 06/11/2021: Reports takes 300 mg capsule 5 times/day.   magnesium oxide (MAG-OX) 400 MG tablet Take by mouth.    nitrofurantoin (MACRODANTIN) 50 MG capsule Take 1  capsule (50 mg total) by mouth at bedtime. For prophylaxis for bladder infection    ondansetron (ZOFRAN) 8 MG tablet Take by mouth. 06/11/2021: Reports takes prn   oxybutynin (DITROPAN) 5 MG tablet Take 1 tablet by mouth twice daily    PARoxetine (PAXIL) 20 MG tablet Take 1 tablet (20 mg total) by mouth daily.    potassium chloride (KLOR-CON) 10 MEQ tablet Take by mouth. 06/11/2021: Reports takes 1 tablet every day   senna-docusate (SENOKOT-S) 8.6-50 MG tablet Take 1 tablet by mouth daily. 06/11/2021: Reports takes as needed   No facility-administered encounter medications on file as of 06/16/2021.    Patient Active Problem List   Diagnosis Date Noted   Left leg weakness 06/02/2021   Recurrent UTI 11/27/2020   Neutropenia associated with infection ([place]) 11/23/2020   Mixed stress and urge urinary incontinence 10/22/2020   Generalized weakness 10/22/2020   Pancytopenia due to antineoplastic chemotherapy ([place]) 10/13/2020   Cerebellar ataxia in diseases classified elsewhere ([place]) 08/08/2020   Retroperitoneal lymphadenopathy 08/08/2020   Peripheral neuropathic pain 07/22/2020   GERD (gastroesophageal reflux disease) 07/03/2020   Stage 3b chronic kidney disease ([place]) 07/03/2020   Bilateral ureteral obstruction 05/27/2020   Hydronephrosis 05/18/2019   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[place]) 05/03/2019   Endometrial adenocarcinoma ([place]) 03/03/2019   Closed compression fracture of body of L1 vertebra ([place]) 02/24/2019   Age-related osteoporosis with current pathological fracture with routine healing 02/24/2019   Fall 02/23/2019   Moderate to severe pulmonary hypertension ([place]) 02/21/2019  Pelvic mass 02/20/2019   Hiatal hernia 02/20/2019   Fibroid uterus 02/20/2019   Bladder wall thickening 02/20/2019   Aortic atherosclerosis ([place]) 02/20/2019   Obesity (BMI 30-39.9) 10/05/2014   ARM PAIN, LEFT 10/13/2010   INSOMNIA  05/13/2009   DIZZINESS 09/13/2007   Hyperlipidemia 08/19/2007   Depression, recurrent ([place]) 09/28/2006   HYPERTENSION, BENIGN SYSTEMIC 09/28/2006   MENOPAUSAL SYNDROME 09/28/2006   VASOVAGAL SYNCOPE 09/28/2006   FATIGUE/MALAISE 09/28/2006    Conditions to be addressed/monitored:  Depression and Endometrial Adenocarcinoma ; Level of care concerns  Care Plan : Social Work [facility] Care Plan  Updates made by Daneen Schick since 06/16/2021 12:00 AM     Problem: Mobility and Independence      Goal: Mobility and Independence Optimized   Start Date: 06/13/2021  Expected End Date: 07/13/2021  Recent Progress: On track  Priority: High  Note:   Current Barriers:  Chronic disease management support and education needs related to Depression, Endometrial Adenocarcinoma, and left leg weakness ADL IADL limitations - 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 Ineffective past interventions including home health and aquatic therapy  Social Worker Clinical Goal(s):  patient will work with SW to identify and address any acute and/or chronic care coordination needs related to the self health management of  Depression, Endometrial Adenocarcinoma, and left leg weakness   Patient will work with SW to identify options for inpatient rehab as desired by the patient  SW Interventions:  Inter-disciplinary care team collaboration (see longitudinal plan of care) Collaboration with Hali Marry, MD regarding development and update of comprehensive plan of care as evidenced by provider attestation and co-signature Outbound call placed to [facility] - voice message left for admissions director Estill Bamberg requesting a return call Successful outbound call placed to the patient to advise of SW second attempt to contact [place] regarding admission into rehab Advised the patient SW would contact her once this SW speak with Estill Bamberg regarding next steps for  admission Encouraged the patient to contact SW as needed  Patient Goals/Self-Care Activities patient will:   -  work with SW regarding SNF placement for inpatient rehab  Follow Up Plan:  SW will follow up with the patient over the next 5 days       Follow Up Plan: SW will follow up with patient by phone over the next 5 days      Daneen Schick, BSW, CDP Social Worker, Certified Dementia Practitioner [place] [facility] [place] [facility] [phone]  Total time spent performing care coordination and/or care management activities with the patient by phone or face to face = 30 minutes.

## 2021-06-16 NOTE — Patient Instructions (Signed)
Social Worker Visit Information  Goals we discussed today:   Goals Addressed             This Visit's Progress    Mobility and Independence Optimized       Timeframe:  Short-Term Goal Priority:  High Start Date: 6.24.22                            Expected End Date: 7.24.22                       Patient Goals/Self-Care Activities patient will- .  - work with SW regarding SNF placement for inpatient rehab          Follow Up Plan: SW will follow up with patient by phone over the next 5 days  Daneen Schick, BSW, CDP Social Worker, Certified Dementia Practitioner [place] [facility] [place] [facility] [phone]

## 2021-06-17 ENCOUNTER — Telehealth: Payer: Self-pay | Admitting: Family Medicine

## 2021-06-17 NOTE — Telephone Encounter (Signed)
Dr Merideth Abbey with [facility] called and stated [facility] called them and wanted to go there for some Rehabilitation. They are needing an FL-2 along with history and physical and medication list for her before they can do an admission. I told her they would have to bring [place] an FL-2 form to be filled out her number is is [phone] and [phone]. Janiene is wanting short term rehab.   Jenny Reichmann

## 2021-06-18 ENCOUNTER — Ambulatory Visit: Payer: Self-pay

## 2021-06-18 DIAGNOSIS — F339 Major depressive disorder, recurrent, unspecified: Secondary | ICD-10-CM

## 2021-06-18 DIAGNOSIS — C541 Malignant neoplasm of endometrium: Secondary | ICD-10-CM

## 2021-06-18 DIAGNOSIS — R29898 Other symptoms and signs involving the musculoskeletal system: Secondary | ICD-10-CM

## 2021-06-18 NOTE — Chronic Care Management (AMB) (Signed)
Chronic Care Management    Social Work Note  06/18/2021 Name: Amy Wilson MRN: [id number] DOB: [birth year]  Amy Wilson is a 70 y.o. year old female who is a primary care patient of Metheney, Rene Kocher, MD. The CCM team was consulted to assist the patient with chronic disease management and/or care coordination needs related to:  Rehab placement .   Engaged with patient by telephone for follow up visit in response to provider referral for social work chronic care management and care coordination services.   Consent to Services:  The patient was given information about Chronic Care Management services, agreed to services, and gave verbal consent prior to initiation of services.  Please see initial visit note for detailed documentation.   Patient agreed to services and consent obtained.   Assessment: Review of patient past medical history, allergies, medications, and health status, including review of relevant consultants reports was performed today as part of a comprehensive evaluation and provision of chronic care management and care coordination services.     SDOH (Social Determinants of Health) assessments and interventions performed:    Advanced Directives Status: Not addressed in this encounter.  CCM Care Plan  Allergies  Allergen Reactions   Latex Itching and Rash    Outpatient Encounter Medications as of 06/18/2021  Medication Sig Note   calcium carbonate (OS-CAL - DOSED IN MG OF ELEMENTAL CALCIUM) 1250 (500 Ca) MG tablet Take 1 tablet by mouth daily.    esomeprazole (NEXIUM) 20 MG capsule Take 10 mg by mouth daily before breakfast. Take 10 mg 30 minutes before breakfast 06/11/2021: Reports takes one 20 mg capsule every morning   gabapentin (NEURONTIN) 100 MG capsule 1 in AM, 1 midday and 3 at bedtime. 06/11/2021: Reports takes 300 mg capsule 5 times/day.   magnesium oxide (MAG-OX) 400 MG tablet Take by mouth.    nitrofurantoin (MACRODANTIN) 50 MG capsule Take 1 capsule  (50 mg total) by mouth at bedtime. For prophylaxis for bladder infection    ondansetron (ZOFRAN) 8 MG tablet Take by mouth. 06/11/2021: Reports takes prn   oxybutynin (DITROPAN) 5 MG tablet Take 1 tablet by mouth twice daily    PARoxetine (PAXIL) 20 MG tablet Take 1 tablet (20 mg total) by mouth daily.    potassium chloride (KLOR-CON) 10 MEQ tablet Take by mouth. 06/11/2021: Reports takes 1 tablet every day   senna-docusate (SENOKOT-S) 8.6-50 MG tablet Take 1 tablet by mouth daily. 06/11/2021: Reports takes as needed   No facility-administered encounter medications on file as of 06/18/2021.    Patient Active Problem List   Diagnosis Date Noted   Left leg weakness 06/02/2021   Recurrent UTI 11/27/2020   Neutropenia associated with infection ([place]) 11/23/2020   Mixed stress and urge urinary incontinence 10/22/2020   Generalized weakness 10/22/2020   Pancytopenia due to antineoplastic chemotherapy ([place]) 10/13/2020   Cerebellar ataxia in diseases classified elsewhere ([place]) 08/08/2020   Retroperitoneal lymphadenopathy 08/08/2020   Peripheral neuropathic pain 07/22/2020   GERD (gastroesophageal reflux disease) 07/03/2020   Stage 3b chronic kidney disease ([place]) 07/03/2020   Bilateral ureteral obstruction 05/27/2020   Hydronephrosis 05/18/2019   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[place]) 05/03/2019   Endometrial adenocarcinoma ([place]) 03/03/2019   Closed compression fracture of body of L1 vertebra ([place]) 02/24/2019   Age-related osteoporosis with current pathological fracture with routine healing 02/24/2019   Fall 02/23/2019   Moderate to severe pulmonary hypertension ([place]) 02/21/2019  Pelvic mass 02/20/2019   Hiatal hernia 02/20/2019   Fibroid uterus 02/20/2019   Bladder wall thickening 02/20/2019   Aortic atherosclerosis ([place]) 02/20/2019   Obesity (BMI 30-39.9) 10/05/2014   ARM PAIN, LEFT 10/13/2010   INSOMNIA 05/13/2009    DIZZINESS 09/13/2007   Hyperlipidemia 08/19/2007   Depression, recurrent ([place]) 09/28/2006   HYPERTENSION, BENIGN SYSTEMIC 09/28/2006   MENOPAUSAL SYNDROME 09/28/2006   VASOVAGAL SYNCOPE 09/28/2006   FATIGUE/MALAISE 09/28/2006    Conditions to be addressed/monitored: Depression and Endometrial Adenocarcinoma ; ADL IADL limitations  Care Plan : Social Work [place]  Updates made by Daneen Schick since 06/18/2021 12:00 AM     Problem: Mobility and Independence      Goal: Mobility and Independence Optimized   Start Date: 06/13/2021  Expected End Date: 07/13/2021  This Visit's Progress: On track  Recent Progress: On track  Priority: High  Note:   Current Barriers:  Chronic disease management support and education needs related to Depression, Endometrial Adenocarcinoma, and left leg weakness ADL IADL limitations - 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 Ineffective past interventions including home health and aquatic therapy  Social Worker Clinical Goal(s):  patient will work with SW to identify and address any acute and/or chronic care coordination needs related to the self health management of  Depression, Endometrial Adenocarcinoma, and left leg weakness   Patient will work with SW to identify options for inpatient rehab as desired by the patient  SW Interventions:  Inter-disciplinary care team collaboration (see longitudinal plan of care) Collaboration with Hali Marry, MD regarding development and update of comprehensive plan of care as evidenced by provider attestation and co-signature Performed chart review to note primary care provider has been contacted by Estill Bamberg with [facility] requesting FL2 and H&P Collaboration with Dr. Madilyn Fireman advising SW will initiate FL2 and send to her for completion  Successful outbound call placed to the patient to assess for ADL abilities in order to complete FL2 Advised the patient  SW would collaborate with Dr. Madilyn Fireman to get [facility] and H&P sent to [facility] with [place] for review regarding placement into rehab - patient stated understanding Initiated FL2 and sent to patients primary providers office via secure fax for completion Collaboration with Audelia Hives, CMA to advise SW has sent paperwork. SW requested follow up if paperwork is not received. SW requested to be informed when completed FL2 is sent to [facility] for SW follow up SW will continued to follow   Patient Goals/Self-Care Activities patient will:   -  work with SW regarding SNF placement for inpatient rehab  Follow Up Plan:  SW will follow up with [facility] once paperwork is completed and faxed to confirm receipt       Follow Up Plan:  SW will continue to follow to assist with patient desire for rehab  Daneen Schick, BSW, CDP Social Worker, Certified Dementia Practitioner [place] / [place] [facility] [phone]

## 2021-06-18 NOTE — Telephone Encounter (Signed)
Social worker is filling out the form and will fax it to [place] to sign. See if can get one of the docs to sign off on it today . It if for short term rehabe.

## 2021-06-18 NOTE — Patient Instructions (Signed)
Social Worker Visit Information  Goals we discussed today:   Goals Addressed             This Visit's Progress    Mobility and Independence Optimized   On track    Timeframe:  Short-Term Goal Priority:  High Start Date: 6.24.22                            Expected End Date: 7.24.22                       Patient Goals/Self-Care Activities patient will- .  - Work with SW regarding SNF placement for inpatient rehab          Materials Provided: Verbal education about placement process provided by phone  Follow Up Plan:  SW will continue to follow   Daneen Schick, BSW, CDP Social Worker, Certified Dementia Practitioner [place] [facility] [place] [facility] [phone]

## 2021-06-19 ENCOUNTER — Ambulatory Visit: Payer: Self-pay

## 2021-06-19 DIAGNOSIS — R531 Weakness: Secondary | ICD-10-CM | POA: Diagnosis not present

## 2021-06-19 DIAGNOSIS — F339 Major depressive disorder, recurrent, unspecified: Secondary | ICD-10-CM

## 2021-06-19 DIAGNOSIS — G5601 Carpal tunnel syndrome, right upper limb: Secondary | ICD-10-CM | POA: Diagnosis not present

## 2021-06-19 DIAGNOSIS — R29898 Other symptoms and signs involving the musculoskeletal system: Secondary | ICD-10-CM

## 2021-06-19 DIAGNOSIS — M545 Low back pain, unspecified: Secondary | ICD-10-CM | POA: Diagnosis not present

## 2021-06-19 DIAGNOSIS — R269 Unspecified abnormalities of gait and mobility: Secondary | ICD-10-CM | POA: Diagnosis not present

## 2021-06-19 DIAGNOSIS — M79605 Pain in left leg: Secondary | ICD-10-CM | POA: Diagnosis not present

## 2021-06-19 DIAGNOSIS — C541 Malignant neoplasm of endometrium: Secondary | ICD-10-CM

## 2021-06-19 NOTE — Addendum Note (Signed)
Addended by: Luretha Rued on: 06/19/2021 04:18 PM   Modules accepted: Orders, Level of Service, SmartSet

## 2021-06-19 NOTE — Chronic Care Management (AMB) (Signed)
Chronic Care Management   CCM RN Visit Note  06/19/2021 Name: Amy Wilson MRN: [id number] DOB: 09/02/1951  Subjective: Amy Wilson is a 70 y.o. year old female who is a primary care patient of Metheney, Rene Kocher, MD. The care management team was consulted for assistance with disease management and care coordination needs.    Engaged with patient by telephone on 06/11/21 for initial visit in response to provider referral for case management and/or care coordination services.   Consent to Services:  The patient was given the following information about Chronic Care Management services today, agreed to services, and gave verbal consent: 1. CCM service includes personalized support from designated clinical staff supervised by the primary care provider, including individualized plan of care and coordination with other care providers 2. 24/7 contact phone numbers for assistance for urgent and routine care needs. 3. Service will only be billed when office clinical staff spend 20 minutes or more in a month to coordinate care. 4. Only one practitioner may furnish and bill the service in a calendar month. 5.The patient may stop CCM services at any time (effective at the end of the month) by phone call to the office staff. 6. The patient will be responsible for cost sharing (co-pay) of up to 20% of the service fee (after annual deductible is met). Patient agreed to services and consent obtained.  Patient agreed to services and verbal consent obtained.   Assessment: Review of patient past medical history, allergies, medications, health status, including review of consultants reports, laboratory and other test data, was performed as part of comprehensive evaluation and provision of chronic care management services.   SDOH (Social Determinants of Health) assessments and interventions performed:  SDOH Interventions    Flowsheet Row Most Recent Value  SDOH Interventions   Food Insecurity  Interventions Intervention Not Indicated  Transportation Interventions Intervention Not Indicated        CCM Care Plan  Allergies  Allergen Reactions   Latex Itching and Rash    Outpatient Encounter Medications as of 06/19/2021  Medication Sig Note   calcium carbonate (OS-CAL - DOSED IN MG OF ELEMENTAL CALCIUM) 1250 (500 Ca) MG tablet Take 1 tablet by mouth daily.    esomeprazole (NEXIUM) 20 MG capsule Take 10 mg by mouth daily before breakfast. Take 10 mg 30 minutes before breakfast 06/11/2021: Reports takes one 20 mg capsule every morning   gabapentin (NEURONTIN) 100 MG capsule 1 in AM, 1 midday and 3 at bedtime. 06/11/2021: Reports takes 300 mg capsule 5 times/day.   magnesium oxide (MAG-OX) 400 MG tablet Take by mouth.    nitrofurantoin (MACRODANTIN) 50 MG capsule Take 1 capsule (50 mg total) by mouth at bedtime. For prophylaxis for bladder infection    ondansetron (ZOFRAN) 8 MG tablet Take by mouth. 06/11/2021: Reports takes prn   oxybutynin (DITROPAN) 5 MG tablet Take 1 tablet by mouth twice daily    PARoxetine (PAXIL) 20 MG tablet Take 1 tablet (20 mg total) by mouth daily.    potassium chloride (KLOR-CON) 10 MEQ tablet Take by mouth. 06/11/2021: Reports takes 1 tablet every day   senna-docusate (SENOKOT-S) 8.6-50 MG tablet Take 1 tablet by mouth daily. 06/11/2021: Reports takes as needed   No facility-administered encounter medications on file as of 06/19/2021.    Patient Active Problem List   Diagnosis Date Noted   Left leg weakness 06/02/2021   Recurrent UTI 11/27/2020   Neutropenia associated with infection ([place]) 11/23/2020   Mixed stress and urge  urinary incontinence 10/22/2020   Generalized weakness 10/22/2020   Pancytopenia due to antineoplastic chemotherapy ([employer]) 10/13/2020   Cerebellar ataxia in diseases classified elsewhere ([place]) 08/08/2020   Retroperitoneal lymphadenopathy 08/08/2020   Peripheral neuropathic pain 07/22/2020   GERD (gastroesophageal reflux disease)  07/03/2020   Stage 3b chronic kidney disease ([place]) 07/03/2020   Bilateral ureteral obstruction 05/27/2020   Hydronephrosis 05/18/2019   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[place]) 05/03/2019   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[place]) 05/03/2019   Endometrial adenocarcinoma ([place]) 03/03/2019   Closed compression fracture of body of L1 vertebra ([place]) 02/24/2019   Age-related osteoporosis with current pathological fracture with routine healing 02/24/2019   Fall 02/23/2019   Moderate to severe pulmonary hypertension ([place]) 02/21/2019   Pelvic mass 02/20/2019   Hiatal hernia 02/20/2019   Fibroid uterus 02/20/2019   Bladder wall thickening 02/20/2019   Aortic atherosclerosis ([place]) 02/20/2019   Obesity (BMI 30-39.9) 10/05/2014   ARM PAIN, LEFT 10/13/2010   INSOMNIA 05/13/2009   DIZZINESS 09/13/2007   Hyperlipidemia 08/19/2007   Depression, recurrent ([place]) 09/28/2006   HYPERTENSION, BENIGN SYSTEMIC 09/28/2006   MENOPAUSAL SYNDROME 09/28/2006   VASOVAGAL SYNCOPE 09/28/2006   FATIGUE/MALAISE 09/28/2006    Conditions to be addressed/monitored:Depression and endometrial  cancer with history of falls and limited mobility  Patient Care Plan: General Plan of Care (Adult)     Problem Identified: Development of [place] for self management of health in a patient with history of endometrial cancer; limited mobility; fall risk   Priority: High     Long-Range Goal: Self-Management Plan Developed   Start Date: 06/11/2021  Expected End Date: 09/11/2021  Priority: High  Note:   Current Barriers:  Knowledge Deficits related to long term care plan for management of health in a patient with a history of metastatic endometrial cancer, limited mobility and fall risk, left leg weakness, depression, kidney stent, Chronic pain syndrome and HTN. Chronic pain syndrome managed by [facility].-Stable per last visit on 06/02/21. Client reports HTN is not an issue and reports she  has been taken off all HTN medications. Declines education regarding HTN management. Client reports she lives with her daughter and son- [place]. Daughter works. She reports she spends most of her day in bed due to inability to move her left leg. She reports following chemotherapy treatments she started having mobility issues. Her daughter assist her with bathing and transfers. Client wears undergarments and is reports she gets up and transfers only with assistance. She states, "I don't walk, I can't walk". Client expresses it would take some of the stress off her daughter if she were more mobile and independent. Client declines information on personal care assistance at this time. PHQ2=6/PHQ9=11. She declines referral for counseling resources adding "I have tried counseling and it did not work for me". She reports she is taking her medications as prescribed. She states her primary goal is to go to an inpatient rehab so that she can become more mobile and independent again.  Care Coordination needs related to inpatient rehabilitation placement Unable to independently perform ADL's, transfer, ambulate. Nurse Case Manager Clinical Goal(s):  patient will work with CM clinical social worker regarding community resource needs the patient will demonstrate ongoing self health-care management ability Patient will work with providers as recommended Interventions:  1:1 collaboration with Hali Marry, MD regarding development and update of comprehensive plan of care as evidenced by provider attestation and co-signature Inter-disciplinary care team collaboration (see longitudinal plan of  care) Evaluation of current treatment plan related to patient's health and patient's adherence to plan as established by provider. Provided education to patient re: fall prevention strategies Reviewed medications with patient Reviewed scheduled/upcoming provider appointments including: 06/16/21 Urology; 06/30/21 surgical  services re: STENT change; 07/07/21 [facility]; 07/23/21 Dr. Queen [facility] Social Work referral for assistance client with inpatient rehabilitation application Discussed plans with patient for ongoing care management follow up and provided patient with direct contact information for care management team Discussed the importance of good skin to prevent skin breakdown. Denies any skin issues. Discussed fall prevention strategies due to client at risk for falls due to left leg weakness. Patient Goals/Self-Care Activities - attend all scheduled provider appointments - take medications as prescribed - call provider office for new concerns or questions - work with LCSW to address care coordination needs and work with the clinical team to address health care and disease management related needs.   - review education material on fall prevention strategy. Plan to discuss at next telephone call - follow-up on any referrals for help I am given - think ahead to make sure my need does not become an emergency - Keep a note pad and pen by the phone make a note about what I need or questions  - have a back-up plan - make a list of family or friends that I can call as needed Follow Up Plan: Telephone follow up appointment with care management team member scheduled for: 07/02/21. The patient has been provided with contact information for the care management team and has been advised to call with any health related questions or concerns.     Patient Care Plan: Social Work 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 Ineffective past interventions including home health and aquatic therapy  Social Worker Clinical Goal(s):  patient will work with SW to identify and address any acute and/or chronic care coordination needs related to the self health management of  Depression, Endometrial Adenocarcinoma, and left leg weakness   Patient will work with SW to identify options for inpatient rehab as desired by the patient  SW Interventions:  Inter-disciplinary care team collaboration (see longitudinal plan of care) Collaboration with Hali Marry, MD regarding development and update of comprehensive plan of care as evidenced by provider attestation and co-signature Performed chart review to note primary care provider has been contacted by Estill Bamberg with [facility] requesting FL2 and H&P Collaboration with Dr. Madilyn Fireman advising SW will initiate FL2 and send to her for completion  Successful outbound call placed to the patient to assess for ADL abilities in order to complete FL2 Advised the patient SW would collaborate with Dr. Madilyn Fireman to get [facility] and H&P sent to [facility] with [facility] for review regarding placement into rehab - patient stated understanding Initiated FL2 and sent to patients primary providers office via secure fax for completion Collaboration with Audelia Hives, CMA to advise SW has sent paperwork. SW requested follow up if paperwork is not received. SW requested to be  informed when completed FL2 is sent to [facility] for SW follow up SW will continued to follow   Patient Goals/Self-Care Activities patient will:   -  work with SW regarding SNF placement for inpatient rehab  Follow Up Plan:  SW will follow up with [facility] once paperwork is completed and faxed to confirm receipt       Plan:Telephone follow up appointment with care management team member scheduled for:  07/02/21 and The patient  has been provided with contact information for the care management team and has been advised to call with any health related questions or concerns.   [name], RN, MSN, BSN, CCM Care Management Coordinator [facility] [phone]

## 2021-06-19 NOTE — Patient Instructions (Addendum)
Visit Information   PATIENT GOALS:   Goals      Develop long term care plan for managment of health     Timeframe:  Long-Range Goal Priority:  High Start Date:  06/11/21                           Expected End Date: 09/11/21                      Follow Up Date 07/02/21    - attend all scheduled provider appointments - take medications as prescribed - call provider office for new concerns or questions - work with LCSW to address care coordination needs and work with the clinical team to address health care and disease management related needs.   - review education material on fall prevention strategy. Plan to discuss at next telephone call - follow-up on any referrals for help I am given - think ahead to make sure my need does not become an emergency - Keep a note pad and pen by the phone make a note about what I need or questions  - have a back-up plan - make a list of family or friends that I can call as needed      Mobility and Independence Optimized     Timeframe:  Short-Term Goal Priority:  High Start Date: 6.24.22                            Expected End Date: 7.24.22                       Patient Goals/Self-Care Activities patient will- .  - Work with SW regarding SNF placement for inpatient rehab            Consent to CCM Services: Amy Wilson was given information about Chronic Care Management services today including:  CCM service includes personalized support from designated clinical staff supervised by her physician, including individualized plan of care and coordination with other care providers 24/7 contact phone numbers for assistance for urgent and routine care needs. Service will only be billed when office clinical staff spend 20 minutes or more in a month to coordinate care. Only one practitioner may furnish and bill the service in a calendar month. The patient may stop CCM services at any time (effective at the end of the month) by phone call to the office  staff. The patient will be responsible for cost sharing (co-pay) of up to 20% of the service fee (after annual deductible is met).  Patient agreed to services and verbal consent obtained.   Patient verbalizes understanding of instructions provided today and agrees to view in [place].   Telephone follow up appointment with care management team member scheduled for: 07/02/21 The patient has been provided with contact information for the care management team and has been advised to call with any health related questions or concerns.   [name], RN, MSN, BSN, CCM Care Management Coordinator [facility] [phone]   CLINICAL CARE PLAN: Patient Care Plan: General Plan of Care (Adult)     Problem Identified: Development of [place] for self management of health in a patient with history of endometrial cancer; limited mobility; fall risk   Priority: High     Long-Range Goal: Self-Management Plan Developed   Start Date: 06/11/2021  Expected End Date: 09/11/2021  Priority: High  Note:   Current Barriers:  Knowledge Deficits related to long term care plan for management of health in a patient with a history of metastatic endometrial cancer, limited mobility and fall risk, left leg weakness, depression, kidney stent, Chronic pain syndrome and HTN. Chronic pain syndrome managed by [facility].-Stable per last visit on 06/02/21. Client reports HTN is not an issue and reports she has been taken off all HTN medications. Declines education regarding HTN management. Client reports she lives with her daughter and son- [place]. Daughter works. She reports she spends most of her day in bed due to inability to move her left leg. She reports following chemotherapy treatments she started having mobility issues. Her daughter assist her with bathing and transfers. Client wears undergarments and is reports she gets up and transfers only with assistance. She states, "I  don't walk, I can't walk". Client expresses it would take some of the stress off her daughter if she were more mobile and independent. Client declines information on personal care assistance at this time. PHQ2=6/PHQ9=11. She declines referral for counseling resources adding "I have tried counseling and it did not work for me". She reports she is taking her medications as prescribed. She states her primary goal is to go to an inpatient rehab so that she can become more mobile and independent again.  Care Coordination needs related to inpatient rehabilitation placement Unable to independently perform ADL's, transfer, ambulate. Nurse Case Manager Clinical Goal(s):  patient will work with CM clinical social worker regarding community resource needs the patient will demonstrate ongoing self health-care management ability Patient will work with providers as recommended Interventions:  1:1 collaboration with Hali Marry, MD regarding development and update of comprehensive plan of care as evidenced by provider attestation and co-signature Inter-disciplinary care team collaboration (see longitudinal plan of care) Evaluation of current treatment plan related to patient's health and patient's adherence to plan as established by provider. Provided education to patient re: fall prevention strategies Reviewed medications with patient Reviewed scheduled/upcoming provider appointments including: 06/16/21 Urology; 06/30/21 surgical services re: STENT change; 07/07/21 [facility]; 07/23/21 Dr. Queen [facility] Social Work referral for assistance client with inpatient rehabilitation application Discussed plans with patient for ongoing care management follow up and provided patient with direct contact information for care management team Discussed the importance of good skin to prevent skin breakdown. Denies any skin issues. Discussed fall prevention strategies due to client at risk for falls due to  left leg weakness. Patient Goals/Self-Care Activities - attend all scheduled provider appointments - take medications as prescribed - call provider office for new concerns or questions - work with LCSW to address care coordination needs and work with the clinical team to address health care and disease management related needs.   - review education material on fall prevention strategy. Plan to discuss at next telephone call - follow-up on any referrals for help I am given - think ahead to make sure my need does not become an emergency - Keep a note pad and pen by the phone make a note about what I need or questions  - have a back-up plan - make a list of family or friends that I can call as needed Follow Up Plan: Telephone follow up appointment with care management team member scheduled for: 07/02/21. The patient has been provided with contact information for the care management team and has been advised to call with any health related questions or concerns.  Understanding Your Risk for Falls Each year, millions of people have serious injuries from falls. It is important to understand your risk for falling. Talk with your health care provider about your risk and what you can do to lower it. There are actions you can take athome to lower your risk and prevent falls. If you do have a serious fall, make sure to tell your health care provider.Falling once raises your risk of falling again. How can falls affect me? Serious injuries from falls are common. These include: Broken bones, such as hip fractures. Head injuries, such as traumatic brain injuries (TBI) or concussion. A fear of falling can cause you to avoid activities and stay at home. This canmake your muscles weaker and actually raise your risk for a fall. What can increase my risk? There are a number of risk factors that increase your risk for falling. The more risk factors you have, the higher your risk of falling. Serious injuries  from a fall happen most often to people older than age 23. Children and youngadults ages 44-29 are also at higher risk. Common risk factors include: Weakness in the lower body. Lack (deficiency) of vitamin D. Being generally weak or confused due to long-term (chronic) illness. Dizziness or balance problems. Poor vision. Medicines that cause dizziness or drowsiness. These can include medicines for your blood pressure, heart, anxiety, insomnia, or edema, as well as pain medicines and muscle relaxants. Other risk factors include: Drinking alcohol. Having had a fall in the past. Having depression. Having foot pain or wearing improper footwear. Working at a dangerous job. Having any of the following in your home: Tripping hazards, such as floor clutter or loose rugs. Poor lighting. Pets. Having dementia or memory loss. What actions can I take to lower my risk of falling?     Physical activity Maintain physical fitness. Do strength and balance exercises. Consider taking aregular class to build strength and balance. Yoga and tai chi are good options. Vision Have your eyes checked every year and your vision prescription updated asneeded. Walking aids and footwear Wear nonskid shoes. Do not wear high heels. Do not walk around the house in socks or slippers. Use a cane or walker as told by your health care provider. Home safety Attach secure railings on both sides of your stairs. Install grab bars for your tub, shower, and toilet. Use a bath mat in your tub or shower. Use good lighting in all rooms. Keep a flashlight near your bed. Make sure there is a clear path from your bed to the bathroom. Use night-lights. Do not use throw rugs. Make sure all carpeting is taped or tacked down securely. Remove all clutter from walkways and stairways, including extension cords. Repair uneven or broken steps. Avoid walking on icy or slippery surfaces. Walk on the grass instead of on icy or slick  sidewalks. Use ice melt to get rid of ice on walkways. Use a cordless phone. Questions to ask your health care provider Can you help me check my risk for a fall? Do any of my medicines make me more likely to fall? Should I take a vitamin D supplement? What exercises can I do to improve my strength and balance? Should I make an appointment to have my vision checked? Do I need a bone density test to check for weak bones or osteoporosis? Would it help to use a cane or a walker? Where to find more information Centers for Disease Control and Prevention, [employer]: http://www.wolf.info/ Community-Based Fall Prevention  Programs: http://www.wolf.info/ National Institute on Aging: http://kim-miller.com/ Contact a health care provider if: You fall at home. You are afraid of falling at home. You feel weak, drowsy, or dizzy. Summary Serious injuries from a fall happen most often to people older than age 36. Children and young adults ages 8-29 are also at higher risk. Talk with your health care provider about your risks for falling and how to lower those risks. Taking certain precautions at home can lower your risk for falling. If you fall, always tell your health care provider. This information is not intended to replace advice given to you by your health care provider. Make sure you discuss any questions you have with your healthcare provider. Document Revised: 07/10/2020 Document Reviewed: 07/10/2020 Elsevier Patient Education  [place].

## 2021-06-19 NOTE — Progress Notes (Signed)
This encounter was created in error - please disregard.

## 2021-06-20 NOTE — Telephone Encounter (Signed)
Fl2 faxed to [place] at [employer]. Confirmation received

## 2021-06-24 ENCOUNTER — Telehealth: Payer: Self-pay

## 2021-06-24 ENCOUNTER — Ambulatory Visit (INDEPENDENT_AMBULATORY_CARE_PROVIDER_SITE_OTHER): Payer: Medicare HMO

## 2021-06-24 DIAGNOSIS — F339 Major depressive disorder, recurrent, unspecified: Secondary | ICD-10-CM

## 2021-06-24 DIAGNOSIS — R29898 Other symptoms and signs involving the musculoskeletal system: Secondary | ICD-10-CM

## 2021-06-24 DIAGNOSIS — C541 Malignant neoplasm of endometrium: Secondary | ICD-10-CM

## 2021-06-24 NOTE — Telephone Encounter (Signed)
  Care Management   Follow Up Note   06/24/2021 Name: Amy Wilson MRN: [id number] DOB: 11-24-1951   Referred by: Hali Marry, MD Reason for referral : Chronic Care Management   Collaboration with Nat Christen, CSW to update on progress of patient goal. This Probation officer has been covering for [name] while on PAL. This writer will no longer follow patient.  Follow Up Plan: Patient Social Work needs will be managed by [employer], CSW.  Daneen Schick, BSW, CDP Social Worker, Certified Dementia Practitioner [place] / [place] [facility] [phone]

## 2021-06-24 NOTE — Patient Instructions (Signed)
Social Worker Visit Information  Goals we discussed today:   Goals Addressed             This Visit's Progress    Mobility and Independence Optimized       Timeframe:  Short-Term Goal Priority:  High Start Date: 6.24.22                            Expected End Date: 7.24.22                       Patient Goals/Self-Care Activities patient will:   - Engage with Nat Christen, CSW for ongoing chronic care management needs related to rehab placement          Materials Provided: Verbal education about placement process provided by phone  Follow Up Plan:  No follow up planned by this Probation officer. The patient will remain engaged with RN Care Manager [name]. The patient will be assisted by Nat Christen, CSW for ongoing assistance with rehab placement.   Daneen Schick, BSW, CDP Social Worker, Certified Dementia Practitioner [place] / [place] [facility] [phone]

## 2021-06-24 NOTE — Chronic Care Management (AMB) (Signed)
Chronic Care Management    Social Work Note  06/24/2021 Name: Amy Wilson MRN: [id number] DOB: 1951/04/03  Amy Wilson is a 70 y.o. year old female who is a primary care patient of Metheney, Rene Kocher, MD. The CCM team was consulted to assist the patient with chronic disease management and/or care coordination needs related to: Level of Care Concerns.   Engaged with patient by telephone for follow up visit in response to provider referral for social work chronic care management and care coordination services.   Consent to Services:  The patient was given information about Chronic Care Management services, agreed to services, and gave verbal consent prior to initiation of services.  Please see initial visit note for detailed documentation.   Patient agreed to services and consent obtained.   Assessment: Review of patient past medical history, allergies, medications, and health status, including review of relevant consultants reports was performed today as part of a comprehensive evaluation and provision of chronic care management and care coordination services.     SDOH (Social Determinants of Health) assessments and interventions performed:    Advanced Directives Status: Not addressed in this encounter.  CCM Care Plan  Allergies  Allergen Reactions   Latex Itching and Rash    Outpatient Encounter Medications as of 06/24/2021  Medication Sig Note   calcium carbonate (OS-CAL - DOSED IN MG OF ELEMENTAL CALCIUM) 1250 (500 Ca) MG tablet Take 1 tablet by mouth daily.    esomeprazole (NEXIUM) 20 MG capsule Take 10 mg by mouth daily before breakfast. Take 10 mg 30 minutes before breakfast 06/11/2021: Reports takes one 20 mg capsule every morning   gabapentin (NEURONTIN) 100 MG capsule 1 in AM, 1 midday and 3 at bedtime. 06/11/2021: Reports takes 300 mg capsule 5 times/day.   magnesium oxide (MAG-OX) 400 MG tablet Take by mouth.    nitrofurantoin (MACRODANTIN) 50 MG capsule Take 1  capsule (50 mg total) by mouth at bedtime. For prophylaxis for bladder infection    ondansetron (ZOFRAN) 8 MG tablet Take by mouth. 06/11/2021: Reports takes prn   oxybutynin (DITROPAN) 5 MG tablet Take 1 tablet by mouth twice daily    PARoxetine (PAXIL) 20 MG tablet Take 1 tablet (20 mg total) by mouth daily.    potassium chloride (KLOR-CON) 10 MEQ tablet Take by mouth. 06/11/2021: Reports takes 1 tablet every day   senna-docusate (SENOKOT-S) 8.6-50 MG tablet Take 1 tablet by mouth daily. 06/11/2021: Reports takes as needed   No facility-administered encounter medications on file as of 06/24/2021.    Patient Active Problem List   Diagnosis Date Noted   Left leg weakness 06/02/2021   Recurrent UTI 11/27/2020   Neutropenia associated with infection (Amy Wilson) 11/23/2020   Mixed stress and urge urinary incontinence 10/22/2020   Generalized weakness 10/22/2020   Pancytopenia due to antineoplastic chemotherapy (Amy Wilson) 10/13/2020   Cerebellar ataxia in diseases classified elsewhere (Amy Wilson) 08/08/2020   Retroperitoneal lymphadenopathy 08/08/2020   Peripheral neuropathic pain 07/22/2020   GERD (gastroesophageal reflux disease) 07/03/2020   Stage 3b chronic kidney disease (Amy Wilson) 07/03/2020   Bilateral ureteral obstruction 05/27/2020   Hydronephrosis 05/18/2019   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Amy Wilson) 05/03/2019   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Amy Wilson) 05/03/2019   Endometrial adenocarcinoma (Amy Wilson) 03/03/2019   Closed compression fracture of body of L1 vertebra (Amy Wilson) 02/24/2019   Age-related osteoporosis with current pathological fracture with routine healing 02/24/2019   Fall 02/23/2019   Moderate to severe pulmonary hypertension (Amy Wilson) 02/21/2019  Pelvic mass 02/20/2019   Hiatal hernia 02/20/2019   Fibroid uterus 02/20/2019   Bladder wall thickening 02/20/2019   Aortic atherosclerosis (Amy Wilson) 02/20/2019   Obesity (BMI 30-39.9) 10/05/2014   ARM PAIN, LEFT 10/13/2010   INSOMNIA  05/13/2009   DIZZINESS 09/13/2007   Hyperlipidemia 08/19/2007   Depression, recurrent (Amy Wilson) 09/28/2006   HYPERTENSION, BENIGN SYSTEMIC 09/28/2006   MENOPAUSAL SYNDROME 09/28/2006   VASOVAGAL SYNCOPE 09/28/2006   FATIGUE/MALAISE 09/28/2006    Conditions to be addressed/monitored: Depression and Endometrial Adenocarcinoma ; ADL IADL limitations  Care Plan : Social Work Kenny Amy  Updates made by Amy Wilson since 06/24/2021 12:00 AM     Problem: Mobility and Independence      Goal: Mobility and Independence Optimized   Start Date: 06/13/2021  Expected End Date: 07/13/2021  Recent Progress: On track  Priority: High  Note:   Current Barriers:  Chronic disease management support and education needs related to Depression, Endometrial Adenocarcinoma, and left leg weakness ADL IADL limitations - 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 Ineffective past interventions including home health and aquatic therapy  Social Worker Clinical Goal(s):  patient will work with SW to identify and address any acute and/or chronic care coordination needs related to the self health management of  Depression, Endometrial Adenocarcinoma, and left leg weakness   Patient will work with SW to identify options for inpatient rehab as desired by the patient  SW Interventions:  Inter-disciplinary care team collaboration (see longitudinal plan of care) Collaboration with Hali Marry, MD regarding development and update of comprehensive plan of care as evidenced by provider attestation and co-signature Performed chart review to note primary care provider office faxed patient FL2 to [facility] rehab on July 1 Unsuccessful outbound call placed to [place], Engineer, site with [employer]. Voice message left requesting a return call to confirm receipt of faxed FL2 as well as to assess outcome of insurance authorization Successful outbound call placed to the  patient to update the patient Discussed the patient has attempted to contact Amanda x 4 today without a response - advised the patient she may be out of the office today due to the holiday Discussed plans for this writer to sign off as the Education officer, museum, Para Skeans will return from vacation on 7.6.22 and will assist with patient needs Collaboration with [place] [name] and embedded Eva to advise of this writers intervention and plan  Patient Goals/Self-Care Activities patient will:   -  Engage with Nat Christen, CSW for ongoing chronic care management needs related to rehab placement  Follow Up Plan:  Patient will be contacted by CSW Nat Christen to assist with ongoing care management needs       Follow Up Plan:  No further follow up planned by this Probation officer. The patient will be followed by Nat Christen, CSW for ongoing care management needs.      Amy Wilson, BSW, CDP Social Worker, Certified Dementia Practitioner [place] / [place] Management [phone]

## 2021-06-30 DIAGNOSIS — Z8542 Personal history of malignant neoplasm of other parts of uterus: Secondary | ICD-10-CM | POA: Diagnosis not present

## 2021-06-30 DIAGNOSIS — K219 Gastro-esophageal reflux disease without esophagitis: Secondary | ICD-10-CM | POA: Diagnosis not present

## 2021-06-30 DIAGNOSIS — N131 Hydronephrosis with ureteral stricture, not elsewhere classified: Secondary | ICD-10-CM | POA: Diagnosis not present

## 2021-06-30 DIAGNOSIS — Z87448 Personal history of other diseases of urinary system: Secondary | ICD-10-CM | POA: Diagnosis not present

## 2021-06-30 DIAGNOSIS — Z923 Personal history of irradiation: Secondary | ICD-10-CM | POA: Diagnosis not present

## 2021-06-30 DIAGNOSIS — Z466 Encounter for fitting and adjustment of urinary device: Secondary | ICD-10-CM | POA: Diagnosis not present

## 2021-06-30 DIAGNOSIS — N135 Crossing vessel and stricture of ureter without hydronephrosis: Secondary | ICD-10-CM | POA: Diagnosis not present

## 2021-06-30 DIAGNOSIS — Z90710 Acquired absence of both cervix and uterus: Secondary | ICD-10-CM | POA: Diagnosis not present

## 2021-06-30 DIAGNOSIS — Z9221 Personal history of antineoplastic chemotherapy: Secondary | ICD-10-CM | POA: Diagnosis not present

## 2021-06-30 DIAGNOSIS — Z8589 Personal history of malignant neoplasm of other organs and systems: Secondary | ICD-10-CM | POA: Diagnosis not present

## 2021-07-02 ENCOUNTER — Telehealth: Payer: Medicare HMO

## 2021-07-02 ENCOUNTER — Ambulatory Visit: Payer: Medicare HMO

## 2021-07-02 DIAGNOSIS — I10 Essential (primary) hypertension: Secondary | ICD-10-CM

## 2021-07-02 DIAGNOSIS — F339 Major depressive disorder, recurrent, unspecified: Secondary | ICD-10-CM | POA: Diagnosis not present

## 2021-07-02 DIAGNOSIS — R29898 Other symptoms and signs involving the musculoskeletal system: Secondary | ICD-10-CM

## 2021-07-02 DIAGNOSIS — C541 Malignant neoplasm of endometrium: Secondary | ICD-10-CM | POA: Diagnosis not present

## 2021-07-02 NOTE — Patient Instructions (Signed)
Visit Information  PATIENT GOALS:  Goals Addressed             This Visit's Progress    Develop long term care plan for managment of health   On track    Timeframe:  Long-Range Goal Priority:  High Start Date:  06/11/21                           Expected End Date: 09/11/21                      Follow Up Date 07/30/21   - continue to attend all scheduled provider appointments - continue to take medications as prescribed - call provider office for new concerns or questions - continue to work with LCSW to address care coordination needs and work with the clinical team to address health care and disease management related needs.   - continue to follow-up on any referrals for help you are given - continue to think ahead to make sure your need does not become an emergency - keep a note pad and pen by the phone make a note about what you need or questions  - have a back-up plan - make a list of family or friends that you can call as needed         Patient verbalizes understanding of instructions provided today and agrees to view in [place].   Telephone follow up appointment with care management team member scheduled for: The patient has been provided with contact information for the care management team and has been advised to call with any health related questions or concerns.   [name], RN, MSN, BSN, CCM Care Management Coordinator [facility] [phone]

## 2021-07-02 NOTE — Chronic Care Management (AMB) (Addendum)
Chronic Care Management   CCM RN Visit Note  07/02/2021 Name: Amy Wilson MRN: [id number] DOB: 06-15-1951  Subjective: Amy Wilson is a 70 y.o. year old female who is a primary care patient of Wilson, Amy Kocher, MD. The care management team was consulted for assistance with disease management and care coordination needs.    Engaged with patient by telephone for follow up visit in response to provider referral for case management and/or care coordination services.   Consent to Services:  The patient was given information about Chronic Care Management services, agreed to services, and gave verbal consent prior to initiation of services.  Please see initial visit note for detailed documentation.   Patient agreed to services and verbal consent obtained.   Assessment: Review of patient past medical history, allergies, medications, health status, including review of consultants reports, laboratory and other test data, was performed as part of comprehensive evaluation and provision of chronic care management services.   SDOH (Social Determinants of Health) assessments and interventions performed:    CCM Care Plan  Allergies  Allergen Reactions   Latex Itching and Rash    Outpatient Encounter Medications as of 07/02/2021  Medication Sig Note   calcium carbonate (OS-CAL - DOSED IN MG OF ELEMENTAL CALCIUM) 1250 (500 Ca) MG tablet Take 1 tablet by mouth daily.    esomeprazole (NEXIUM) 20 MG capsule Take 10 mg by mouth daily before breakfast. Take 10 mg 30 minutes before breakfast 06/11/2021: Reports takes one 20 mg capsule every morning   gabapentin (NEURONTIN) 100 MG capsule 1 in AM, 1 midday and 3 at bedtime. 06/11/2021: Reports takes 300 mg capsule 5 times/day.   magnesium oxide (MAG-OX) 400 MG tablet Take by mouth.    nitrofurantoin (MACRODANTIN) 50 MG capsule Take 1 capsule (50 mg total) by mouth at bedtime. For prophylaxis for bladder infection    ondansetron (ZOFRAN) 8 MG tablet  Take by mouth. 06/11/2021: Reports takes prn   oxybutynin (DITROPAN) 5 MG tablet Take 1 tablet by mouth twice daily    PARoxetine (PAXIL) 20 MG tablet Take 1 tablet (20 mg total) by mouth daily.    potassium chloride (KLOR-CON) 10 MEQ tablet Take by mouth. 06/11/2021: Reports takes 1 tablet every day   senna-docusate (SENOKOT-S) 8.6-50 MG tablet Take 1 tablet by mouth daily. 06/11/2021: Reports takes as needed   No facility-administered encounter medications on file as of 07/02/2021.    Patient Active Problem List   Diagnosis Date Noted   Left leg weakness 06/02/2021   Recurrent UTI 11/27/2020   Neutropenia associated with infection ([place]) 11/23/2020   Mixed stress and urge urinary incontinence 10/22/2020   Generalized weakness 10/22/2020   Pancytopenia due to antineoplastic chemotherapy ([place]) 10/13/2020   Cerebellar ataxia in diseases classified elsewhere ([place]) 08/08/2020   Retroperitoneal lymphadenopathy 08/08/2020   Peripheral neuropathic pain 07/22/2020   GERD (gastroesophageal reflux disease) 07/03/2020   Stage 3b chronic kidney disease ([place]) 07/03/2020   Bilateral ureteral obstruction 05/27/2020   Hydronephrosis 05/18/2019   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[place]) 05/03/2019   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[place]) 05/03/2019   Endometrial adenocarcinoma ([place]) 03/03/2019   Closed compression fracture of body of L1 vertebra ([place]) 02/24/2019   Age-related osteoporosis with current pathological fracture with routine healing 02/24/2019   Fall 02/23/2019   Moderate to severe pulmonary hypertension ([place]) 02/21/2019   Pelvic mass 02/20/2019   Hiatal hernia 02/20/2019   Fibroid uterus 02/20/2019   Bladder wall thickening 02/20/2019  Aortic atherosclerosis ([place]) 02/20/2019   Obesity (BMI 30-39.9) 10/05/2014   ARM PAIN, LEFT 10/13/2010   INSOMNIA 05/13/2009   DIZZINESS 09/13/2007   Hyperlipidemia 08/19/2007   Depression, recurrent ([place]) 09/28/2006   HYPERTENSION,  BENIGN SYSTEMIC 09/28/2006   MENOPAUSAL SYNDROME 09/28/2006   VASOVAGAL SYNCOPE 09/28/2006   FATIGUE/MALAISE 09/28/2006    Conditions to be addressed/monitored: fall risk/prevention, mobility issues, depression, history of endometrial cancer  Care Plan : General Plan of Care (Adult)  Updates made by Amy Rued, RN since 07/02/2021 12:00 AM     Problem: Development of [place] for self management of health in a patient with history of endometrial cancer; limited mobility; fall risk   Priority: High     Long-Range Goal: Self-Management Plan Developed   Start Date: 06/11/2021  Expected End Date: 09/11/2021  This Visit's Progress: On track  Priority: High  Note:   Current Barriers:  Knowledge Deficits related to long term care plan for management of health in a patient with a history of metastatic endometrial cancer, limited mobility and fall risk, left leg weakness, depression, kidney stent, Chronic pain syndrome and HTN. Chronic pain syndrome managed by [facility] per last visit on 06/02/21. Next visit scheduled for 07/07/21. Client reports HTN is not an issue and reports she has been taken off all HTN medications. Declines education regarding HTN management. Client lives with her daughter and son- in Sports coach. Daughter works. She reports she spends most of her day in bed due to inability to move her left leg. She reports following chemotherapy treatments she started having mobility issues. Her daughter assist her with bathing and transfers. Client wears undergarments and is reports she gets up and transfers only with assistance. Client expresses it would take some of the stress off her daughter if she were more mobile and independent. Client declines information on personal care assistance at this time. PHQ2=6/PHQ9=11. She declines referral for counseling resources adding "I have tried counseling and it did not work for me". She reports she is taking her medications  as prescribed. She states her primary goal is to go to an inpatient rehab so that she can become more mobile and independent again. Bilateral Ureteral Stent change done on 06/30/21. She reports this is done every 6 months. Client denies any problems related to procedure. Primary focus for client continues to be getting into a rehab facility she states she spoke with Estill Bamberg (administrative office at [facility]) on yesterday and was told that all the paper work has been turned in and they are awaiting approval from the insurance.  Care Coordination needs related to inpatient rehabilitation placement Unable to independently perform ADL's, transfer, ambulate. Nurse Case Manager Clinical Goal(s):  patient will work with CM clinical social worker regarding community resource needs the patient will demonstrate ongoing self health-care management ability Patient will work with providers as recommended Interventions:  1:1 collaboration with Hali Marry, MD regarding development and update of comprehensive plan of care as evidenced by provider attestation and co-signature Inter-disciplinary care team collaboration (see longitudinal plan of care) Evaluation of current treatment plan related to patient's health and patient's adherence to plan as established by provider. Reviewed medications with patient Reviewed scheduled/upcoming provider appointments.  Encouraged continued collaboration with clinic Associate Professor at [employer]. Initiated discussion regarding alternative plan if not approved for rehab facility such as home health or outpatient rehab. Reiterated importance of fall prevention  Reinforced the importance of good skin  care and mobility in prevention of skin breakdown. Discussed plans with patient for ongoing care management follow up and provided patient with direct contact information for care management team Patient Goals/Self-Care Activities - continue  to attend all scheduled provider appointments - continue to take medications as prescribed - call provider office for new concerns or questions - continue to work with LCSW to address care coordination needs and work with the clinical team to address health care and disease management related needs.   - continue to follow-up on any referrals for help you are given - continue to think ahead to make sure your need does not become an emergency - keep a note pad and pen by the phone make a note about what you need or questions  - have a back-up plan - make a list of family or friends that you can call as needed Follow Up Plan: Telephone follow up appointment with care management team member scheduled for: 07/30/21.    Plan:Telephone follow up appointment with care management team member scheduled for:  07/30/21 and The patient has been provided with contact information for the care management team and has been advised to call with any health related questions or concerns.   [name], RN, MSN, BSN, CCM Care Management Coordinator [facility] [phone]

## 2021-07-04 ENCOUNTER — Telehealth: Payer: Medicare HMO

## 2021-07-04 ENCOUNTER — Ambulatory Visit: Payer: Medicare HMO | Admitting: *Deleted

## 2021-07-04 DIAGNOSIS — R29898 Other symptoms and signs involving the musculoskeletal system: Secondary | ICD-10-CM

## 2021-07-04 DIAGNOSIS — F339 Major depressive disorder, recurrent, unspecified: Secondary | ICD-10-CM

## 2021-07-04 DIAGNOSIS — R531 Weakness: Secondary | ICD-10-CM

## 2021-07-04 DIAGNOSIS — C541 Malignant neoplasm of endometrium: Secondary | ICD-10-CM

## 2021-07-04 DIAGNOSIS — N1832 Chronic kidney disease, stage 3b: Secondary | ICD-10-CM

## 2021-07-04 DIAGNOSIS — I10 Essential (primary) hypertension: Secondary | ICD-10-CM

## 2021-07-04 DIAGNOSIS — E669 Obesity, unspecified: Secondary | ICD-10-CM

## 2021-07-04 DIAGNOSIS — M8000XD Age-related osteoporosis with current pathological fracture, unspecified site, subsequent encounter for fracture with routine healing: Secondary | ICD-10-CM

## 2021-07-04 DIAGNOSIS — R42 Dizziness and giddiness: Secondary | ICD-10-CM

## 2021-07-04 NOTE — Chronic Care Management (AMB) (Signed)
Chronic Care Management    Clinical Social Work Note  07/04/2021 Name: Amy Wilson MRN: [id number] DOB: 06/20/1951  Amy Wilson is a 70 y.o. year old female who is a primary care patient of Metheney, Rene Kocher, MD. The CCM team was consulted to assist the patient with chronic disease management and/or care coordination needs related to: [employer], Level of Care Concerns, and Mental Health Counseling and Resources.   Engaged with patient by telephone for follow up visit in response to provider referral for social work chronic care management and care coordination services.   Consent to Services:  The patient was given information about Chronic Care Management services, agreed to services, and gave verbal consent prior to initiation of services.  Please see initial visit note for detailed documentation.   Patient agreed to services and consent obtained.   Assessment: Review of patient past medical history, allergies, medications, and health status, including review of relevant consultants reports was performed today as part of a comprehensive evaluation and provision of chronic care management and care coordination services.     SDOH (Social Determinants of Health) assessments and interventions performed:  SDOH Interventions    Flowsheet Row Most Recent Value  SDOH Interventions   Food Insecurity Interventions Intervention Not Indicated  Financial Strain Interventions Intervention Not Indicated  Housing Interventions Intervention Not Indicated  Intimate Partner Violence Interventions Intervention Not Indicated  Physical Activity Interventions Patient Refused, Other (Comments)  [Assisting patient with skilled nursing placement to receive rehabiltiative services.]  Stress Interventions Intervention Not Indicated, Offered [employer], Patient Refused  Social Connections Interventions Intervention Not Indicated, Patient Refused  Transportation Interventions  Intervention Not Indicated  Depression Interventions/Treatment  Medication, Counseling, Patient refuses Treatment  [Patient reported, "I won't be depressed and have such little interest in performing activities if I could just walk again".]        Advanced Directives Status: See Care Plan for related entries.  CCM Care Plan  Allergies  Allergen Reactions   Latex Itching and Rash    Outpatient Encounter Medications as of 07/04/2021  Medication Sig Note   calcium carbonate (OS-CAL - DOSED IN MG OF ELEMENTAL CALCIUM) 1250 (500 Ca) MG tablet Take 1 tablet by mouth daily.    esomeprazole (NEXIUM) 20 MG capsule Take 10 mg by mouth daily before breakfast. Take 10 mg 30 minutes before breakfast 06/11/2021: Reports takes one 20 mg capsule every morning   gabapentin (NEURONTIN) 100 MG capsule 1 in AM, 1 midday and 3 at bedtime. 06/11/2021: Reports takes 300 mg capsule 5 times/day.   magnesium oxide (MAG-OX) 400 MG tablet Take by mouth.    nitrofurantoin (MACRODANTIN) 50 MG capsule Take 1 capsule (50 mg total) by mouth at bedtime. For prophylaxis for bladder infection    ondansetron (ZOFRAN) 8 MG tablet Take by mouth. 06/11/2021: Reports takes prn   oxybutynin (DITROPAN) 5 MG tablet Take 1 tablet by mouth twice daily    PARoxetine (PAXIL) 20 MG tablet Take 1 tablet (20 mg total) by mouth daily.    potassium chloride (KLOR-CON) 10 MEQ tablet Take by mouth. 06/11/2021: Reports takes 1 tablet every day   senna-docusate (SENOKOT-S) 8.6-50 MG tablet Take 1 tablet by mouth daily. 06/11/2021: Reports takes as needed   No facility-administered encounter medications on file as of 07/04/2021.    Patient Active Problem List   Diagnosis Date Noted   Left leg weakness 06/02/2021   Recurrent UTI 11/27/2020   Neutropenia associated with infection ([place]) 11/23/2020  Mixed stress and urge urinary incontinence 10/22/2020   Generalized weakness 10/22/2020   Pancytopenia due to antineoplastic chemotherapy ([place])  10/13/2020   Cerebellar ataxia in diseases classified elsewhere ([place]) 08/08/2020   Retroperitoneal lymphadenopathy 08/08/2020   Peripheral neuropathic pain 07/22/2020   GERD (gastroesophageal reflux disease) 07/03/2020   Stage 3b chronic kidney disease ([place]) 07/03/2020   Bilateral ureteral obstruction 05/27/2020   Hydronephrosis 05/18/2019   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[place]) 05/03/2019   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[place]) 05/03/2019   Endometrial adenocarcinoma ([place]) 03/03/2019   Closed compression fracture of body of L1 vertebra ([place]) 02/24/2019   Age-related osteoporosis with current pathological fracture with routine healing 02/24/2019   Fall 02/23/2019   Moderate to severe pulmonary hypertension ([place]) 02/21/2019   Pelvic mass 02/20/2019   Hiatal hernia 02/20/2019   Fibroid uterus 02/20/2019   Bladder wall thickening 02/20/2019   Aortic atherosclerosis ([place]) 02/20/2019   Obesity (BMI 30-39.9) 10/05/2014   ARM PAIN, LEFT 10/13/2010   INSOMNIA 05/13/2009   DIZZINESS 09/13/2007   Hyperlipidemia 08/19/2007   Depression, recurrent ([place]) 09/28/2006   HYPERTENSION, BENIGN SYSTEMIC 09/28/2006   MENOPAUSAL SYNDROME 09/28/2006   VASOVAGAL SYNCOPE 09/28/2006   FATIGUE/MALAISE 09/28/2006    Conditions to be addressed/monitored: HTN, CKD Stage III, and Depression.  Level of Care Concerns, ADL/IADL Limitations, Mental Health Concerns, Limited Access to Caregiver, and Lacks Knowledge of [employer].  Care Plan : LCSW Plan of Care  Updates made by Francis Gaines, LCSW since 07/04/2021 12:00 AM     Problem: Mobility and Independence.   Priority: High     Goal: Mobility and Independence Optimized.   Start Date: 06/13/2021  Expected End Date: 07/11/2021  This Visit's Progress: On track  Recent Progress: On track  Priority: High  Note:   Current Barriers:  Chronic disease management support and education needs related to Mobility Issues, Fall  Risk, Endometrial Adenocarcinoma, Depression, Hypertension, Left Leg Weakness, Fatigue, Stage III Chronic Kidney Disease, Osteoporosis, Obesity, Generalized Weakness and Dizziness. 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   Prior interventions have been Ineffective (I.e home health physical and occupational therapy and aquatic therapy) Clinical Goal(s):  Patient will work with LCSW to identify and address any acute and chronic care coordination needs related to the self-health management of Depression, Endometrial Adenocarcinoma, Fatigue, Left Leg Weakness, Dizziness and Osteoporosis.    Patient desires to receive inpatient rehabilitative services in a skilled nursing facility, and will work with LCSW to identify options.   LCSW provided patient with a list of skilled nursing facilities within a 50-mile radius that offer short-term rehabilitative services. LCSW will obtain a completed and signed FL-2 Form from patient's Primary Care Physician, Dr. [name]. Once completed and signed FL-2 Form is obtained, LCSW agreed to fax to all skilled nursing facilities of interest to try and pursue bed offers. LCSW will submit appropriate paperwork to [facility] to try and obtain prior approval and authorization for short-term skilled nursing placement. LCSW Interventions:  Inter-disciplinary care team collaboration (see longitudinal plan of care). Collaboration with Primary Care Physician, Dr. [name] regarding development and update of comprehensive plan of care as evidenced by provider attestation and co-signature. Performed thorough chart review. Faxed completed and signed FL-2 Form to patient's one facility of interest, [facility], on 06/20/2021.   Left HIPAA compliant voicemail messages for Estill Bamberg, Admissions Director at [facility], requesting to know the status of patient's insurance authorization and bed availability.  Supportive counseling and attentive listening skills were utilized when patient voiced concerns about not having received any return calls from [place], Engineer, site at [facility], after 4 failed attempts.   Collaboration with Embedded RNCM, [name] regarding initiation of placement for patient.   LCSW will continue to try and make contact with Estill Bamberg, Admissions Director at [facility], to coordinate short-term skilled nursing placement for patient. LCSW will continue to try and make contact with a representative at [facility] to obtain prior approval and authorization for short-term skilled nursing placement for patient. Patient Goals/Self-Care Activities: Receive weekly calls from LCSW in an effort to coordinate skilled nursing placement for short-term rehabilitative services. Continue with your attempts in trying to make contact with Estill Bamberg, Admissions Director at [facility], to inquire about the status of your application for admission. Contact LCSW directly (# [id number]) if you have questions or if additional social work needs are identified. Follow-Up Plan:  LCSW will make contact with patient on 07/11/2021 at 2:30pm.      Follow Up Plan:  LCSW will follow-up with patient by phone on 07/11/2021 at 2:30pm.      Nat Christen LCSW Licensed Clinical Social Worker [place] [phone]

## 2021-07-04 NOTE — Patient Instructions (Signed)
Visit Information   PATIENT GOALS:   Goals Addressed               This Visit's Progress     Mobility and Independence Optimized. (pt-stated)   On track     Timeframe:  Short-Term Goal Priority:  High Start Date:   06/13/2021                         Expected End Date:  07/11/2021      Follow-Up Date:  07/11/2021 at 2:30pm     Patient Goals/Self-Care Activities: Receive weekly calls from LCSW in an effort to coordinate skilled nursing placement for short-term rehabilitative services. Continue with your attempts in trying to make contact with Estill Bamberg, Admissions Director at [facility], to inquire about the status of your application for admission. Contact LCSW directly (# [id number]) if you have questions or if additional social work needs are identified.         Consent to CCM Services: Ms. Vanpelt was given information about Chronic Care Management services today including:  CCM service includes personalized support from designated clinical staff supervised by her physician, including individualized plan of care and coordination with other care providers 24/7 contact phone numbers for assistance for urgent and routine care needs. Service will only be billed when office clinical staff spend 20 minutes or more in a month to coordinate care. Only one practitioner may furnish and bill the service in a calendar month. The patient may stop CCM services at any time (effective at the end of the month) by phone call to the office staff. The patient will be responsible for cost sharing (co-pay) of up to 20% of the service fee (after annual deductible is met).  Patient agreed to services and verbal consent obtained.   Patient verbalizes understanding of instructions provided today and agrees to view in [place].   Telephone follow up appointment with care management team member scheduled for: 07/11/2021 at 2:30pm.  Nat Christen LCSW Licensed Clinical Social Worker [place] [phone]   CLINICAL CARE PLAN: Patient Care Plan: General Plan of Care (Adult)     Problem Identified: Development of [place] for self management of health in a patient with history of endometrial cancer; limited mobility; fall risk   Priority: High     Long-Range Goal: Self-Management Plan Developed   Start Date: 06/11/2021  Expected End Date: 09/11/2021  This Visit's Progress: On track  Priority: High  Note:   Current Barriers:  Knowledge Deficits related to long term care plan for management of health in a patient with a history of metastatic endometrial cancer, limited mobility and fall risk, left leg weakness, depression, kidney stent, Chronic pain syndrome and HTN. Chronic pain syndrome managed by [facility] per last visit on 06/02/21. Next visit scheduled for 07/07/21. Client reports HTN is not an issue and reports she has been taken off all HTN medications. Declines education regarding HTN management. Client lives with her daughter and son- in [place]. Daughter works. She reports she spends most of her day in bed due to inability to move her left leg. She reports following chemotherapy treatments she started having mobility issues. Her daughter assist her with bathing and transfers. Client wears undergarments and is reports she gets up and transfers only with assistance. Client expresses it would take some of the stress off her daughter if she were more mobile and independent. Client declines information on  personal care assistance at this time. PHQ2=6/PHQ9=11. She declines referral for counseling resources adding "I have tried counseling and it did not work for me". She reports she is taking her medications as prescribed. She states her primary goal is to go to an inpatient rehab so that she can become more mobile and independent again. Bilateral Ureteral Stent change done on 06/30/21. She reports this is done every 6 months. Client denies  any problems related to procedure. Primary focus for client continues to be getting into a rehab facility she states she spoke with Estill Bamberg (administrative office at [facility]) on yesterday and was told that all the paper work has been turned in and they are awaiting approval from the insurance.  Care Coordination needs related to inpatient rehabilitation placement Unable to independently perform ADL's, transfer, ambulate. Nurse Case Manager Clinical Goal(s):  patient will work with CM clinical social worker regarding community resource needs the patient will demonstrate ongoing self health-care management ability Patient will work with providers as recommended Interventions:  1:1 collaboration with Hali Marry, MD regarding development and update of comprehensive plan of care as evidenced by provider attestation and co-signature Inter-disciplinary care team collaboration (see longitudinal plan of care) Evaluation of current treatment plan related to patient's health and patient's adherence to plan as established by provider. Reviewed medications with patient Reviewed scheduled/upcoming provider appointments.  Encouraged continued collaboration with clinic Associate Professor at [employer]. Initiated discussion regarding alternative plan if not approved for rehab facility such as home health or outpatient rehab. Reiterated importance of fall prevention  Reinforced the importance of good skin care and mobility in prevention of skin breakdown. Discussed plans with patient for ongoing care management follow up and provided patient with direct contact information for care management team Patient Goals/Self-Care Activities - continue to attend all scheduled provider appointments - continue to take medications as prescribed - call provider office for new concerns or questions - continue to work with LCSW to address care coordination needs and work with the  clinical team to address health care and disease management related needs.   - continue to follow-up on any referrals for help you are given - continue to think ahead to make sure your need does not become an emergency - keep a note pad and pen by the phone make a note about what you need or questions  - have a back-up plan - make a list of family or friends that you can call as needed Follow Up Plan: Telephone follow up appointment with care management team member scheduled for: 07/30/21.     Patient Care Plan: LCSW Plan of Care     Problem Identified: Mobility and Independence.   Priority: High     Goal: Mobility and Independence Optimized.   Start Date: 06/13/2021  Expected End Date: 07/11/2021  This Visit's Progress: On track  Recent Progress: On track  Priority: High  Note:   Current Barriers:  Chronic disease management support and education needs related to Mobility Issues, Fall Risk, Endometrial Adenocarcinoma, Depression, Hypertension, Left Leg Weakness, Fatigue, Stage III Chronic Kidney Disease, Osteoporosis, Obesity, Generalized Weakness and Dizziness. 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   Prior interventions have been Ineffective (I.e home health physical and occupational therapy and aquatic therapy) Clinical Goal(s):  Patient will work with LCSW to identify and address any acute and chronic care coordination needs related to the self-health management of Depression, Endometrial Adenocarcinoma, Fatigue,  Left Leg Weakness, Dizziness and Osteoporosis.    Patient desires to receive inpatient rehabilitative services in a skilled nursing facility, and will work with LCSW to identify options.   LCSW provided patient with a list of skilled nursing facilities within a 50-mile radius that offer short-term rehabilitative services. LCSW will obtain a completed and signed FL-2 Form from patient's Primary Care Physician, Dr.  [name]. Once completed and signed FL-2 Form is obtained, LCSW agreed to fax to all skilled nursing facilities of interest to try and pursue bed offers. LCSW will submit appropriate paperwork to [facility] to try and obtain prior approval and authorization for short-term skilled nursing placement. LCSW Interventions:  Inter-disciplinary care team collaboration (see longitudinal plan of care). Collaboration with Primary Care Physician, Dr. [name] regarding development and update of comprehensive plan of care as evidenced by provider attestation and co-signature. Performed thorough chart review. Faxed completed and signed FL-2 Form to patient's one facility of interest, [facility], on 06/20/2021.   Left HIPAA compliant voicemail messages for Estill Bamberg, Admissions Director at [facility], requesting to know the status of patient's insurance authorization and bed availability.   Supportive counseling and attentive listening skills were utilized when patient voiced concerns about not having received any return calls from [place], Engineer, site at [facility], after 4 failed attempts.   Collaboration with Embedded RNCM, [name] regarding initiation of placement for patient.   LCSW will continue to try and make contact with Estill Bamberg, Admissions Director at [facility], to coordinate short-term skilled nursing placement for patient. LCSW will continue to try and make contact with a representative at [facility] to obtain prior approval and authorization for short-term skilled nursing placement for patient. Patient Goals/Self-Care Activities: Receive weekly calls from LCSW in an effort to coordinate skilled nursing placement for short-term rehabilitative services. Continue with your attempts in trying to make contact with Estill Bamberg, Admissions Director at [facility], to inquire about the status of your application for admission. Contact LCSW directly (# [id number]) if  you have questions or if additional social work needs are identified. Follow-Up Plan:  LCSW will make contact with patient on 07/11/2021 at 2:30pm.

## 2021-07-11 ENCOUNTER — Ambulatory Visit: Payer: Medicare HMO | Admitting: *Deleted

## 2021-07-11 DIAGNOSIS — M79672 Pain in left foot: Secondary | ICD-10-CM

## 2021-07-11 DIAGNOSIS — R29898 Other symptoms and signs involving the musculoskeletal system: Secondary | ICD-10-CM

## 2021-07-11 DIAGNOSIS — R5382 Chronic fatigue, unspecified: Secondary | ICD-10-CM

## 2021-07-11 DIAGNOSIS — I10 Essential (primary) hypertension: Secondary | ICD-10-CM

## 2021-07-11 DIAGNOSIS — E669 Obesity, unspecified: Secondary | ICD-10-CM

## 2021-07-11 DIAGNOSIS — R531 Weakness: Secondary | ICD-10-CM

## 2021-07-11 DIAGNOSIS — M8000XD Age-related osteoporosis with current pathological fracture, unspecified site, subsequent encounter for fracture with routine healing: Secondary | ICD-10-CM

## 2021-07-11 DIAGNOSIS — S32010A Wedge compression fracture of first lumbar vertebra, initial encounter for closed fracture: Secondary | ICD-10-CM

## 2021-07-11 DIAGNOSIS — I272 Pulmonary hypertension, unspecified: Secondary | ICD-10-CM

## 2021-07-11 DIAGNOSIS — R42 Dizziness and giddiness: Secondary | ICD-10-CM

## 2021-07-11 DIAGNOSIS — N1832 Chronic kidney disease, stage 3b: Secondary | ICD-10-CM

## 2021-07-11 DIAGNOSIS — G9332 Myalgic encephalomyelitis/chronic fatigue syndrome: Secondary | ICD-10-CM

## 2021-07-11 NOTE — Chronic Care Management (AMB) (Signed)
Chronic Care Management    Clinical Social Work Note  07/11/2021 Name: Amy Wilson MRN: [id number] DOB: [birth year]  Amy Wilson is a 70 y.o. year old female who is a primary care patient of Metheney, Rene Kocher, MD. The CCM team was consulted to assist the patient with chronic disease management and/or care coordination needs related to: Level of Care Concerns.   Engaged with patient by telephone for follow up visit in response to provider referral for social work chronic care management and care coordination services.   Consent to Services:  The patient was given information about Chronic Care Management services, agreed to services, and gave verbal consent prior to initiation of services.  Please see initial visit note for detailed documentation.   Patient agreed to services and consent obtained.   Assessment: Review of patient past medical history, allergies, medications, and health status, including review of relevant consultants reports was performed today as part of a comprehensive evaluation and provision of chronic care management and care coordination services.     SDOH (Social Determinants of Health) assessments and interventions performed:    Advanced Directives Status: Not addressed in this encounter.  CCM Care Plan  Allergies  Allergen Reactions   Latex Itching and Rash    Outpatient Encounter Medications as of 07/11/2021  Medication Sig Note   calcium carbonate (OS-CAL - DOSED IN MG OF ELEMENTAL CALCIUM) 1250 (500 Ca) MG tablet Take 1 tablet by mouth daily.    esomeprazole (NEXIUM) 20 MG capsule Take 10 mg by mouth daily before breakfast. Take 10 mg 30 minutes before breakfast 06/11/2021: Reports takes one 20 mg capsule every morning   gabapentin (NEURONTIN) 100 MG capsule 1 in AM, 1 midday and 3 at bedtime. 06/11/2021: Reports takes 300 mg capsule 5 times/day.   magnesium oxide (MAG-OX) 400 MG tablet Take by mouth.    nitrofurantoin (MACRODANTIN) 50 MG capsule  Take 1 capsule (50 mg total) by mouth at bedtime. For prophylaxis for bladder infection    ondansetron (ZOFRAN) 8 MG tablet Take by mouth. 06/11/2021: Reports takes prn   oxybutynin (DITROPAN) 5 MG tablet Take 1 tablet by mouth twice daily    PARoxetine (PAXIL) 20 MG tablet Take 1 tablet (20 mg total) by mouth daily.    potassium chloride (KLOR-CON) 10 MEQ tablet Take by mouth. 06/11/2021: Reports takes 1 tablet every day   senna-docusate (SENOKOT-S) 8.6-50 MG tablet Take 1 tablet by mouth daily. 06/11/2021: Reports takes as needed   No facility-administered encounter medications on file as of 07/11/2021.    Patient Active Problem List   Diagnosis Date Noted   Left leg weakness 06/02/2021   Recurrent UTI 11/27/2020   Neutropenia associated with infection ([place]) 11/23/2020   Mixed stress and urge urinary incontinence 10/22/2020   Generalized weakness 10/22/2020   Pancytopenia due to antineoplastic chemotherapy ([place]) 10/13/2020   Cerebellar ataxia in diseases classified elsewhere ([place]) 08/08/2020   Retroperitoneal lymphadenopathy 08/08/2020   Peripheral neuropathic pain 07/22/2020   GERD (gastroesophageal reflux disease) 07/03/2020   Stage 3b chronic kidney disease ([place]) 07/03/2020   Bilateral ureteral obstruction 05/27/2020   Hydronephrosis 05/18/2019   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[place]) 05/03/2019   Endometrial adenocarcinoma ([place]) 03/03/2019   Closed compression fracture of body of L1 vertebra ([place]) 02/24/2019   Age-related osteoporosis with current pathological fracture with routine healing 02/24/2019   Fall 02/23/2019   Moderate to severe pulmonary hypertension ([place]) 02/21/2019  Pelvic mass 02/20/2019   Hiatal hernia 02/20/2019   Fibroid uterus 02/20/2019   Bladder wall thickening 02/20/2019   Aortic atherosclerosis ([place]) 02/20/2019   Obesity (BMI 30-39.9) 10/05/2014   ARM PAIN, LEFT 10/13/2010    INSOMNIA 05/13/2009   DIZZINESS 09/13/2007   Hyperlipidemia 08/19/2007   Depression, recurrent ([place]) 09/28/2006   HYPERTENSION, BENIGN SYSTEMIC 09/28/2006   MENOPAUSAL SYNDROME 09/28/2006   VASOVAGAL SYNCOPE 09/28/2006   FATIGUE/MALAISE 09/28/2006    Conditions to be addressed/monitored: HTN and CKD Stage III.  Level of Care Concerns, Limited Access to Caregiver and ADL/IADL Limitations.  Care Plan : LCSW Plan of Care  Updates made by Francis Gaines, LCSW since 07/11/2021 12:00 AM     Problem: Mobility and Independence.   Priority: High     Goal: Mobility and Independence Optimized.   Start Date: 06/13/2021  Expected End Date: 07/24/2021  This Visit's Progress: On track  Recent Progress: On track  Priority: High  Note:   Current Barriers:  Chronic disease management support and education needs related to Mobility Issues, Fall Risk, Endometrial Adenocarcinoma, Depression, Hypertension, Left Leg Weakness, Fatigue, Stage III Chronic Kidney Disease, Osteoporosis, Obesity, Generalized Weakness and Dizziness. 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   Prior interventions have been Ineffective (I.e home health physical and occupational therapy and aquatic therapy) Clinical Goal(s):  Patient will work with LCSW to identify and address any acute and chronic care coordination needs related to the self-health management of Depression, Endometrial Adenocarcinoma, Fatigue, Left Leg Weakness, Dizziness and Osteoporosis.    Patient desires to receive inpatient rehabilitative services in a skilled nursing facility, and will work with LCSW to identify options.   LCSW provided patient with a list of skilled nursing facilities within a 50-mile radius that offer short-term rehabilitative services. LCSW will obtain a completed and signed FL-2 Form from patient's Primary Care Physician, Dr. [name]. Once completed and signed FL-2 Form  is obtained, LCSW agreed to fax to all skilled nursing facilities of interest to try and pursue bed offers. LCSW will submit appropriate paperwork to [facility] to try and obtain prior approval and authorization for short-term skilled nursing placement. LCSW Interventions:  Inter-disciplinary care team collaboration (see longitudinal plan of care). Collaboration with Primary Care Physician, Dr. [name] regarding development and update of comprehensive plan of care as evidenced by provider attestation and co-signature. Performed thorough chart review. Faxed completed and signed FL-2 Form to patient's one facility of interest, [facility], on 06/20/2021.   Left HIPAA compliant voicemail messages for Estill Bamberg, Admissions Director at [facility], requesting to know the status of patient's insurance authorization and bed availability.   Supportive counseling and attentive listening skills were utilized when patient voiced concerns about not having received any return calls from [place], Engineer, site at [facility], after 4 failed attempts.   Collaboration with Embedded RNCM, [name] regarding initiation of placement for patient.   LCSW will continue to try and make contact with Estill Bamberg, Admissions Director at [facility], to coordinate short-term skilled nursing placement for patient. LCSW will continue to try and make contact with a representative at [facility] to obtain prior approval and authorization for short-term skilled nursing placement for patient. Patient Goals/Self-Care Activities: Receive weekly calls from LCSW in an effort to coordinate skilled nursing placement for short-term rehabilitative services. Continue with your attempts in trying to make contact with Estill Bamberg, Admissions Director at [facility], to inquire about the status of your  application for admission. LCSW will continue with her attempts in trying to make contact with Estill Bamberg, Admissions Director at  [facility], to inquire about the status of your application for admission. Patient encouraged to consider alternative skilled nursing facilities to receive short-term rehabilitative services. Patient provided with a complete list of skilled nursing facilities within a 50-mile radius, offering short-term rehabilitative services, and encouraged to provide LCSW with at least 4 facilities of interest, during our next scheduled telephone outreach call. Contact LCSW directly (# [id number]) if you have questions or if additional social work needs are identified. Follow-Up Plan:  LCSW will make contact with patient on 07/24/2021 at 9:00am.      Follow-Up Plan:  LCSW will follow-up with patient by phone on 07/24/2021 at [place] [phone]

## 2021-07-11 NOTE — Patient Instructions (Signed)
Visit Information  PATIENT GOALS:  Goals Addressed               This Visit's Progress     Mobility and Independence Optimized. (pt-stated)   On track     Timeframe:  Short-Term Goal Priority:  High Start Date:   06/13/2021                         Expected End Date:  07/24/2021    Follow-Up Date:  07/24/2021 at 9:00am.  Patient Goals/Self-Care Activities: Receive weekly calls from LCSW in an effort to coordinate skilled nursing placement for short-term rehabilitative services. Continue with your attempts in trying to make contact with Estill Bamberg, Admissions Director at [facility], to inquire about the status of your application for admission. LCSW will continue with her attempts in trying to make contact with Estill Bamberg, Admissions Director at [facility], to inquire about the status of your application for admission. Patient encouraged to consider alternative skilled nursing facilities to receive short-term rehabilitative services. Patient provided with a complete list of skilled nursing facilities within a 50-mile radius, offering short-term rehabilitative services, and encouraged to provide LCSW with at least 4 facilities of interest, during our next scheduled telephone outreach call. Contact LCSW directly (# [phone]) if you have questions or if additional social work needs are identified.         Patient verbalizes understanding of instructions provided today and agrees to view in [place].   Telephone follow-up appointment with care management team member scheduled for: 07/24/2021 at [place] LCSW Licensed Clinical Social Worker [place] [phone]

## 2021-07-14 NOTE — Telephone Encounter (Signed)
This is way too much back and forth. She is welcome to schedule an appt

## 2021-07-15 IMAGING — MR MR LUMBAR SPINE W/O CM
4 of 5 series · 26 of 48 positions shown · non-contrast
Comparison: None.

CLINICAL DATA: Lumbar radiculopathy. Left leg weakness for greater
than 6 weeks. Unable to bear weight.

EXAM:
MRI LUMBAR SPINE WITHOUT CONTRAST
TECHNIQUE: Multiplanar, multisequence MR imaging of the lumbar spine was
performed. No intravenous contrast was administered.

[Series 3: T2 · sagittal · 4.0mm · 0.81mm/px · 6 of 15 slices shown (1 of 2)]
[im 1/15]
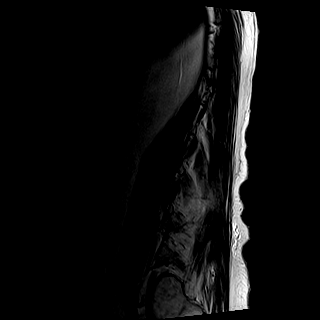
[im 3/15]
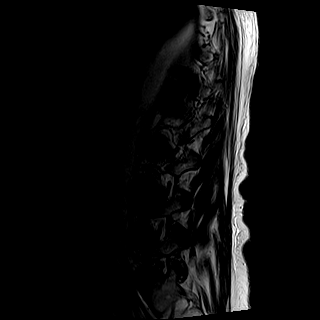
[im 6/15]
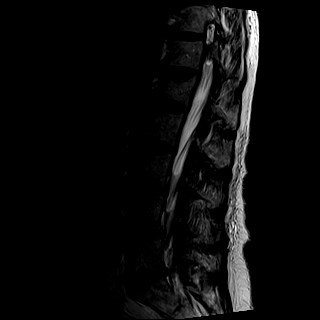
[im 9/15]
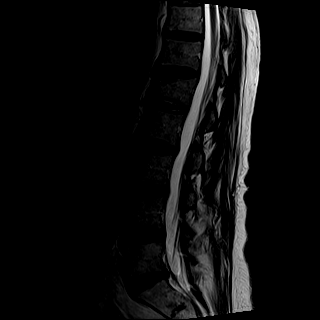
[im 12/15]
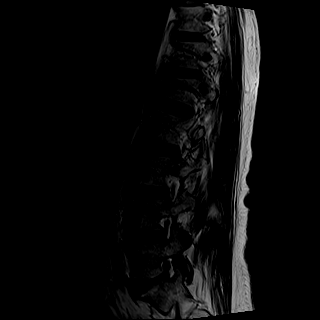
[im 15/15]
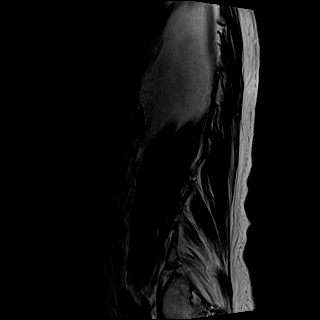

[Series 4: T1 · sagittal · 4.0mm · 0.41mm/px · 6 of 15 slices shown (1 of 2)]
[im 1/15]
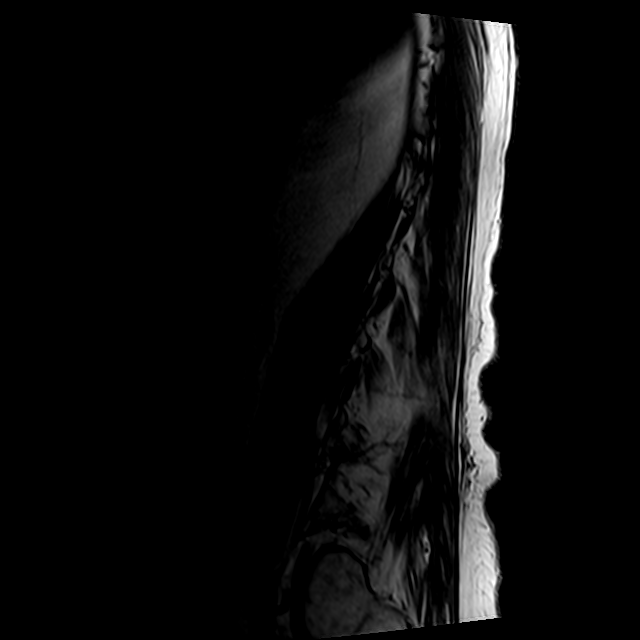
[im 3/15]
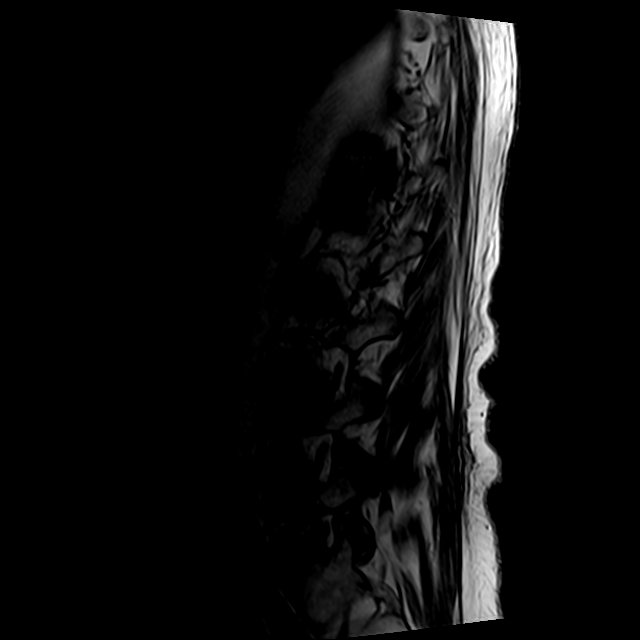
[im 6/15]
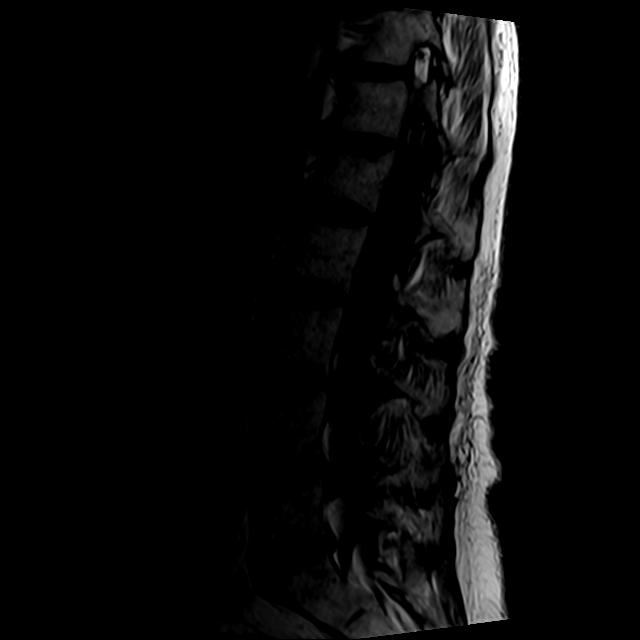
[im 9/15]
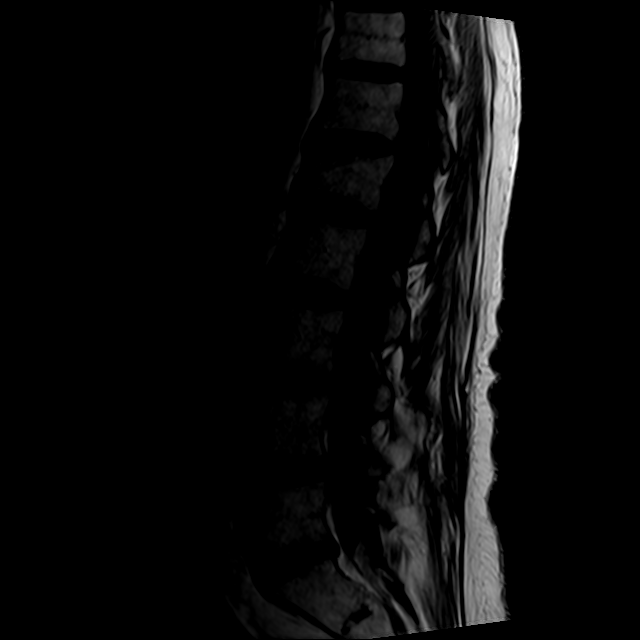
[im 12/15]
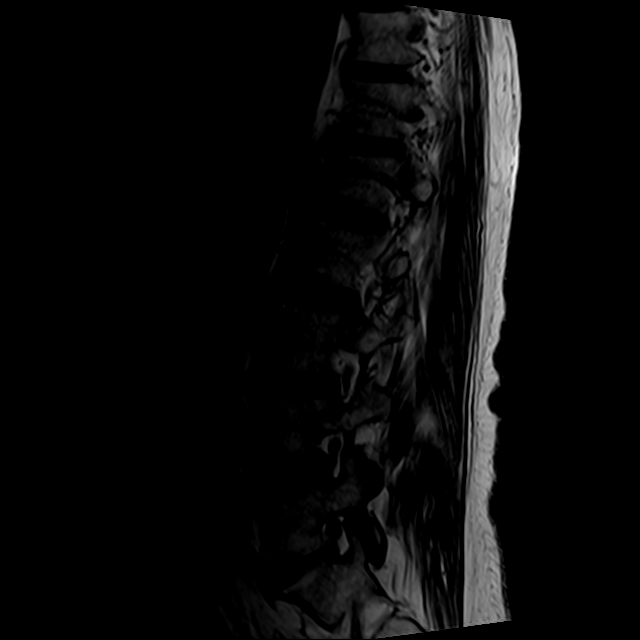
[im 15/15]
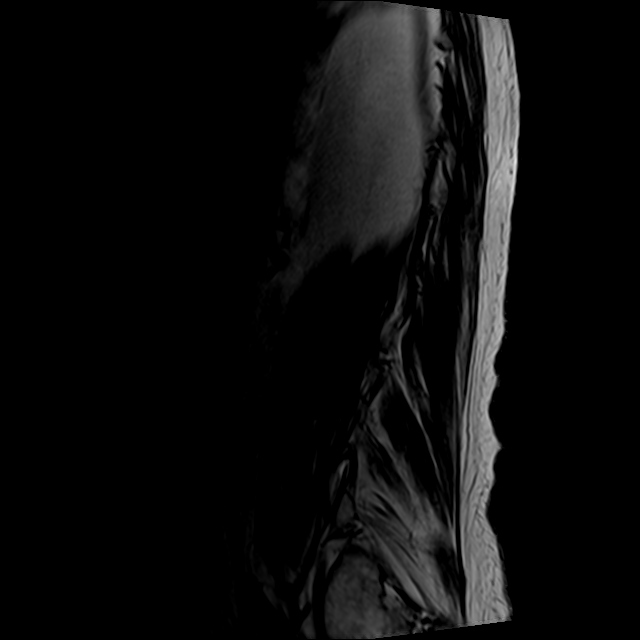

[Series 6: T2 · axial · 4.0mm · 0.78mm/px · z∈[-72,+132]mm · 9 of 40 slices shown (2 of 2)]
[im 1/40]
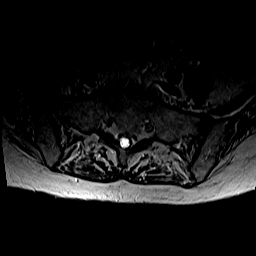
[im 6/40]
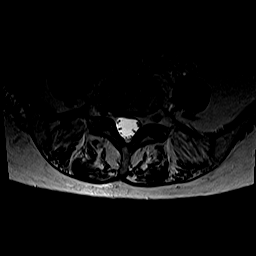
[im 12/40]
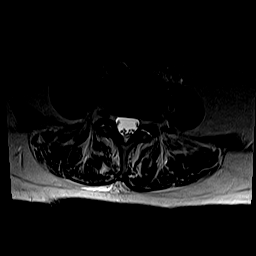
[im 17/40]
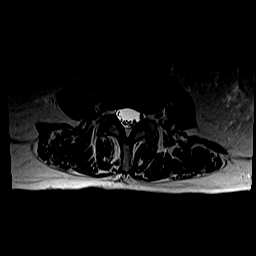
[im 20/40]
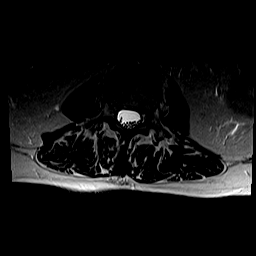
[im 23/40]
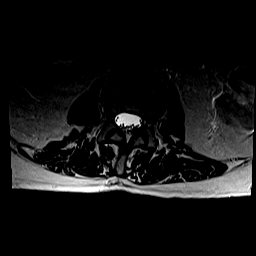
[im 28/40]
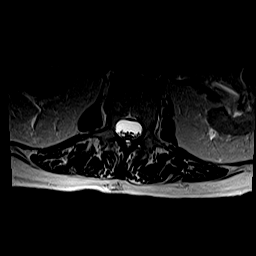
[im 34/40]
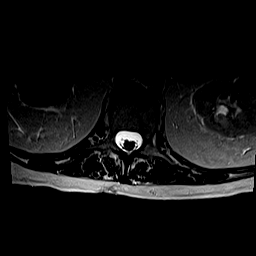
[im 40/40]
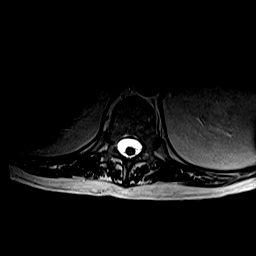

[Series 7: T1 · axial · 4.0mm · 0.39mm/px · z∈[-72,+102]mm · 5 of 40 slices shown (2 of 2)]
[im 1/40]
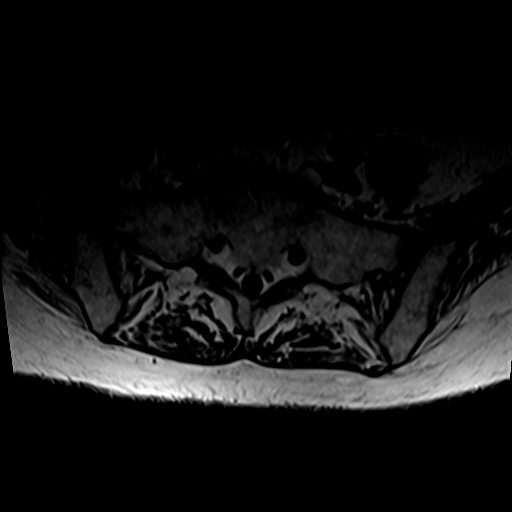
[im 6/40]
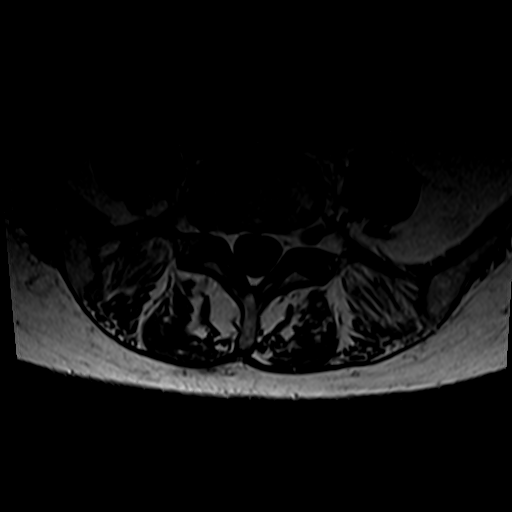
[im 12/40]
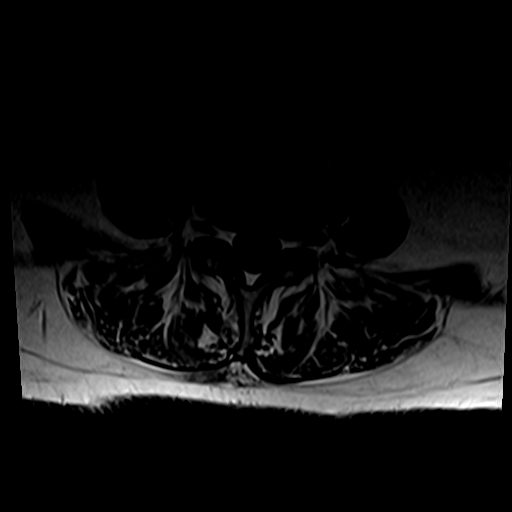
[im 20/40]
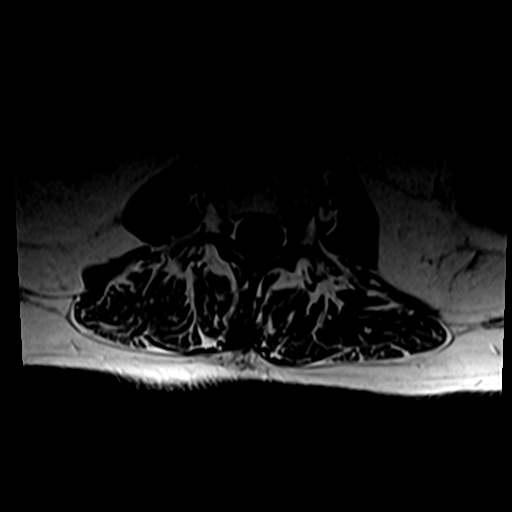
[im 34/40]
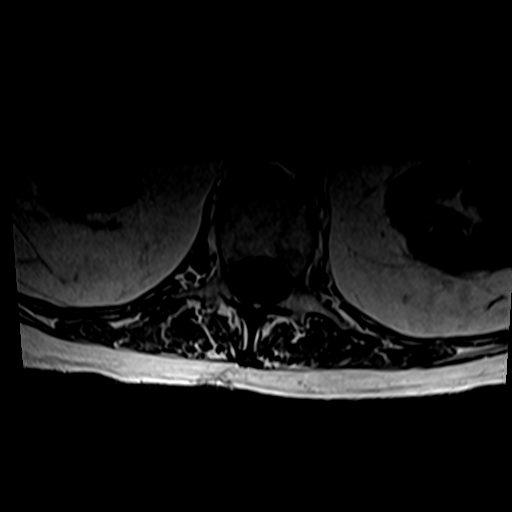

[26 of 48 positions shown; findings below may reference images not displayed]

FINDINGS: Segmentation: 5 non rib-bearing lumbar type vertebral bodies are
present. The lowest fully formed vertebral body is L5.

Alignment: No significant listhesis is present. There is slight
straightening of the normal lumbar lordosis.

Vertebrae: Remote superior endplate compression fractures present at
L1 40% loss of height is present anteriorly. No retropulsed bone is
present. Posterior elements are intact. Marrow signal and vertebral
body heights are otherwise normal.

Conus medullaris and cauda equina: Conus extends to the L2 level.
Conus and cauda equina appear normal.

Paraspinal and other soft tissues: Limited imaging the abdomen is
unremarkable. There is no significant adenopathy. No solid organ
lesions are present.

Disc levels:

L1-2: Negative.

L2-3: Mild disc bulging and facet hypertrophy is present without
significant stenosis.

L3-4: Mild disc bulging and facet hypertrophy is present without
significant stenosis.

L4-5: Mild broad-based disc bulging is present. Central annular tear
noted. Mild facet hypertrophy present bilaterally. Minimal left
foraminal narrowing is present. The central canal and right foramen
are patent.

L5-S1: Mild broad-based disc bulging is present. Central annular
tear is present. No significant central canal stenosis is present.
Mild right foraminal narrowing is present.
IMPRESSION: 1. Minimal left foraminal narrowing at L4-5.
2. Mild right foraminal narrowing at L5-S1.
3. Central annular tears at L4-5 and L5-S[DATE] contribute to
irritation and inflammation of the nerve roots.

## 2021-07-16 DIAGNOSIS — S32000A Wedge compression fracture of unspecified lumbar vertebra, initial encounter for closed fracture: Secondary | ICD-10-CM | POA: Diagnosis not present

## 2021-07-17 DIAGNOSIS — R29898 Other symptoms and signs involving the musculoskeletal system: Secondary | ICD-10-CM | POA: Diagnosis not present

## 2021-07-17 DIAGNOSIS — R201 Hypoesthesia of skin: Secondary | ICD-10-CM | POA: Diagnosis not present

## 2021-07-17 DIAGNOSIS — M5417 Radiculopathy, lumbosacral region: Secondary | ICD-10-CM | POA: Diagnosis not present

## 2021-07-17 DIAGNOSIS — G620 Drug-induced polyneuropathy: Secondary | ICD-10-CM | POA: Diagnosis not present

## 2021-07-22 ENCOUNTER — Other Ambulatory Visit: Payer: Self-pay | Admitting: Specialist

## 2021-07-22 DIAGNOSIS — R292 Abnormal reflex: Secondary | ICD-10-CM

## 2021-07-24 ENCOUNTER — Ambulatory Visit (INDEPENDENT_AMBULATORY_CARE_PROVIDER_SITE_OTHER): Payer: Medicare HMO | Admitting: *Deleted

## 2021-07-24 DIAGNOSIS — R531 Weakness: Secondary | ICD-10-CM

## 2021-07-24 DIAGNOSIS — S32010A Wedge compression fracture of first lumbar vertebra, initial encounter for closed fracture: Secondary | ICD-10-CM

## 2021-07-24 DIAGNOSIS — M792 Neuralgia and neuritis, unspecified: Secondary | ICD-10-CM

## 2021-07-24 DIAGNOSIS — R5382 Chronic fatigue, unspecified: Secondary | ICD-10-CM

## 2021-07-24 DIAGNOSIS — R42 Dizziness and giddiness: Secondary | ICD-10-CM

## 2021-07-24 DIAGNOSIS — I272 Pulmonary hypertension, unspecified: Secondary | ICD-10-CM

## 2021-07-24 DIAGNOSIS — I10 Essential (primary) hypertension: Secondary | ICD-10-CM

## 2021-07-24 DIAGNOSIS — E669 Obesity, unspecified: Secondary | ICD-10-CM

## 2021-07-24 DIAGNOSIS — M8000XD Age-related osteoporosis with current pathological fracture, unspecified site, subsequent encounter for fracture with routine healing: Secondary | ICD-10-CM

## 2021-07-24 DIAGNOSIS — C541 Malignant neoplasm of endometrium: Secondary | ICD-10-CM

## 2021-07-24 DIAGNOSIS — R29898 Other symptoms and signs involving the musculoskeletal system: Secondary | ICD-10-CM

## 2021-07-24 DIAGNOSIS — G9332 Myalgic encephalomyelitis/chronic fatigue syndrome: Secondary | ICD-10-CM

## 2021-07-24 DIAGNOSIS — N1832 Chronic kidney disease, stage 3b: Secondary | ICD-10-CM

## 2021-07-24 NOTE — Progress Notes (Signed)
Orders Placed This Encounter  Procedures   Ambulatory referral to Home Health    Referral Priority:   Routine    Referral Type:   Home Health Care    Referral Reason:   Specialty Services Required    Requested Specialty:   Home Health Services    Number of Visits Requested:   1    

## 2021-07-24 NOTE — Addendum Note (Signed)
Addended by: [name] D on: 07/24/2021 01:14 PM   Modules accepted: Orders

## 2021-07-24 NOTE — Patient Instructions (Signed)
Visit Information  PATIENT GOALS:  Goals Addressed               This Visit's Progress     Mobility and Independence Optimized. (pt-stated)   On track     Timeframe:  Short-Term Goal Priority:  High Start Date:   06/13/2021                         Expected End Date:   09/12/2021  Follow-Up Date:  07/31/2021 at 2:00pm.  Patient Goals/Self-Care Activities: Receive weekly calls from LCSW in an effort to coordinate skilled nursing placement for short-term rehabilitative services. LCSW will begin to pursue other skilled nursing facilities for short-term rehabilitative services, now that Estill Bamberg, Engineer, site at [facility], has denied admission. Patient provided with a complete list of skilled nursing facilities offering short-term rehabilitative services, and encouraged to provide LCSW with at least 4 facilities of interest, during our next scheduled telephone outreach call. LCSW collaboration with Primary Care Physician, Dr. [name] to request an order for a motorized scooter. LCSW collaboration with Primary Care Physician, Dr. [name] to request order and arrangement of home health physical therapy and occupational therapy services. LCSW will submit home health physical and occupational therapy notes to [facility] to try and obtain prior approval/authorization for short-term skilled nursing placement for rehabilitative services. Keep appointment for MRI of head, neck and spine, scheduled for 07/26/2021. LCSW collaboration with Primary Care Physician, Dr. [name] to request completion of an FL-2 Form. Keep appointment with Primary Care Physician, Dr. [name], scheduled for 08/04/2021, as she will need to meet with you face-to-face to complete the FL-2 Form. Once completed and signed FL-2 Form is obtained, LCSW will fax to all skilled nursing facilities of interest to try and pursue bed offers for short-term rehabilitative  services. Contact LCSW directly (# [phone]) if you have questions, need assistance, or if additional social work needs are identified.         Patient verbalizes understanding of instructions provided today and agrees to view in [place].   Telephone follow-up appointment with care management team member scheduled for:  07/31/2021 at 2:00pm.  Nat Christen LCSW Licensed Clinical Social Worker [place] [phone]

## 2021-07-24 NOTE — Chronic Care Management (AMB) (Signed)
Chronic Care Management    Clinical Social Work Note  07/24/2021 Name: Amy Wilson MRN: [id number] DOB: November 28, 1951  Amy Wilson is a 70 y.o. year old female who is a primary care patient of Metheney, Amy Kocher, MD. The CCM team was consulted to assist the patient with chronic disease management and/or care coordination needs related to: Level of Care Concerns.   Engaged with patient by telephone for follow-up visit in response to provider referral for social work chronic care management and care coordination services.   Consent to Services:  The patient was given information about Chronic Care Management services, agreed to services, and gave verbal consent prior to initiation of services.  Please see initial visit note for detailed documentation.   Patient agreed to services and consent obtained.   Assessment: Review of patient past medical history, allergies, medications, and health status, including review of relevant consultants reports was performed today as part of a comprehensive evaluation and provision of chronic care management and care coordination services.     SDOH (Social Determinants of Health) assessments and interventions performed:    Advanced Directives Status: Not addressed in this encounter.  CCM Care Plan  Allergies  Allergen Reactions   Latex Itching and Rash    Outpatient Encounter Medications as of 07/24/2021  Medication Sig Note   calcium carbonate (OS-CAL - DOSED IN MG OF ELEMENTAL CALCIUM) 1250 (500 Ca) MG tablet Take 1 tablet by mouth daily.    esomeprazole (NEXIUM) 20 MG capsule Take 10 mg by mouth daily before breakfast. Take 10 mg 30 minutes before breakfast 06/11/2021: Reports takes one 20 mg capsule every morning   gabapentin (NEURONTIN) 100 MG capsule 1 in AM, 1 midday and 3 at bedtime. 06/11/2021: Reports takes 300 mg capsule 5 times/day.   magnesium oxide (MAG-OX) 400 MG tablet Take by mouth.    nitrofurantoin (MACRODANTIN) 50 MG capsule Take  1 capsule (50 mg total) by mouth at bedtime. For prophylaxis for bladder infection    ondansetron (ZOFRAN) 8 MG tablet Take by mouth. 06/11/2021: Reports takes prn   oxybutynin (DITROPAN) 5 MG tablet Take 1 tablet by mouth twice daily    PARoxetine (PAXIL) 20 MG tablet Take 1 tablet (20 mg total) by mouth daily.    potassium chloride (KLOR-CON) 10 MEQ tablet Take by mouth. 06/11/2021: Reports takes 1 tablet every day   senna-docusate (SENOKOT-S) 8.6-50 MG tablet Take 1 tablet by mouth daily. 06/11/2021: Reports takes as needed   No facility-administered encounter medications on file as of 07/24/2021.    Patient Active Problem List   Diagnosis Date Noted   Left leg weakness 06/02/2021   Recurrent UTI 11/27/2020   Neutropenia associated with infection ([place]) 11/23/2020   Mixed stress and urge urinary incontinence 10/22/2020   Generalized weakness 10/22/2020   Pancytopenia due to antineoplastic chemotherapy ([place]) 10/13/2020   Cerebellar ataxia in diseases classified elsewhere ([place]) 08/08/2020   Retroperitoneal lymphadenopathy 08/08/2020   Peripheral neuropathic pain 07/22/2020   GERD (gastroesophageal reflux disease) 07/03/2020   Stage 3b chronic kidney disease ([place]) 07/03/2020   Bilateral ureteral obstruction 05/27/2020   Hydronephrosis 05/18/2019   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[place]) 05/03/2019   Endometrial adenocarcinoma ([place]) 03/03/2019   Closed compression fracture of body of L1 vertebra ([place]) 02/24/2019   Age-related osteoporosis with current pathological fracture with routine healing 02/24/2019   Fall 02/23/2019   Moderate to severe pulmonary hypertension ([place]) 02/21/2019  Pelvic mass 02/20/2019   Hiatal hernia 02/20/2019   Fibroid uterus 02/20/2019   Bladder wall thickening 02/20/2019   Aortic atherosclerosis ([place]) 02/20/2019   Obesity (BMI 30-39.9) 10/05/2014   ARM PAIN, LEFT 10/13/2010   INSOMNIA  05/13/2009   DIZZINESS 09/13/2007   Hyperlipidemia 08/19/2007   Depression, recurrent ([place]) 09/28/2006   HYPERTENSION, BENIGN SYSTEMIC 09/28/2006   MENOPAUSAL SYNDROME 09/28/2006   VASOVAGAL SYNCOPE 09/28/2006   FATIGUE/MALAISE 09/28/2006    Conditions to be addressed/monitored: HTN, CKD Stage 4 and Inability to Ambulate.  Level of Care Concerns, ADL/ADL Limitations, and Limited Access to Caregiver.  Care Plan : LCSW Plan of Care  Updates made by Amy Gaines, LCSW since 07/24/2021 12:00 AM     Problem: Mobility and Independence.   Priority: High     Goal: Mobility and Independence Optimized.   Start Date: 06/13/2021  Expected End Date: 09/12/2021  This Visit's Progress: On track  Recent Progress: On track  Priority: High  Note:   Current Barriers:  Chronic disease management support and education needs related to Mobility Issues, Fall Risk, Endometrial Adenocarcinoma, Depression, Hypertension, Left Leg Weakness, Fatigue, Stage III Chronic Kidney Disease, Osteoporosis, Obesity, Generalized Weakness and Dizziness. 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   Prior interventions have been Ineffective (I.e home health physical and occupational therapy and aquatic therapy) Clinical Goal(s):  Patient will work with LCSW to identify and address any acute and chronic care coordination needs related to the self-health management of Depression, Endometrial Adenocarcinoma, Fatigue, Left Leg Weakness, Dizziness and Osteoporosis.    Patient desires to receive inpatient rehabilitative services in a skilled nursing facility, and will work with LCSW to identify options.   LCSW provided patient with a list of skilled nursing facilities within a 50-mile radius that offer short-term rehabilitative services. LCSW will obtain a completed and signed FL-2 Form from patient's Primary Care Physician, Dr. Beatrice Wilson. Once completed and signed  FL-2 Form is obtained, LCSW agreed to fax to all skilled nursing facilities of interest to try and pursue bed offers. LCSW will submit appropriate paperwork to [facility] to try and obtain prior approval and authorization for short-term skilled nursing placement. LCSW Interventions:  Inter-disciplinary care team collaboration (see longitudinal plan of care). Collaboration with Primary Care Physician, Dr. Beatrice Wilson regarding development and update of comprehensive plan of care as evidenced by provider attestation and co-signature. Performed thorough chart review. Faxed completed and signed FL-2 Form to patient's one facility of interest, [facility], on 06/20/2021.   Left HIPAA compliant voicemail messages for Estill Bamberg, Admissions Director at [facility], requesting to know the status of patient's insurance authorization and bed availability.   Supportive counseling and attentive listening skills were utilized when patient voiced concerns about not having received any return calls from [place], Engineer, site at [facility], after 4 failed attempts.   Collaboration with Embedded RNCM, [name] regarding initiation of placement for patient.   LCSW will continue to try and make contact with Estill Bamberg, Admissions Director at [facility], to coordinate short-term skilled nursing placement for patient. LCSW will continue to try and make contact with a representative at [facility] to obtain prior approval and authorization for short-term skilled nursing placement for patient. Patient Goals/Self-Care Activities: Receive weekly calls from LCSW in an effort to coordinate skilled nursing placement for short-term rehabilitative services. LCSW will begin to pursue other skilled nursing facilities for short-term rehabilitative services, now that [place], Engineer, site at [employer] [facility], has denied admission. Patient provided with a complete list of skilled nursing facilities offering  short-term rehabilitative services, and encouraged to provide LCSW with at least 4 facilities of interest, during our next scheduled telephone outreach call. LCSW collaboration with Primary Care Physician, Dr. Beatrice Wilson to request an order for a motorized scooter. LCSW collaboration with Primary Care Physician, Dr. Beatrice Wilson to request order and arrangement of home health physical therapy and occupational therapy services. LCSW will submit home health physical and occupational therapy notes to [facility] to try and obtain prior approval/authorization for short-term skilled nursing placement for rehabilitative services. Keep appointment for MRI of head, neck and spine, scheduled for 07/26/2021. LCSW collaboration with Primary Care Physician, Dr. Beatrice Wilson to request completion of an FL-2 Form. Keep appointment with Primary Care Physician, Dr. Beatrice Wilson, scheduled for 08/04/2021, as she will need to meet with you face-to-face to complete the FL-2 Form. Once completed and signed FL-2 Form is obtained, LCSW will fax to all skilled nursing facilities of interest to try and pursue bed offers for short-term rehabilitative services. Contact LCSW directly (# [phone]) if you have questions, need assistance, or if additional social work needs are identified. Follow-Up Plan:  LCSW will make contact with patient on 07/31/2021 at 2:00pm.      Follow-Up Plan:  LCSW will follow-up with patient by phone on 07/31/2021 at 2:00pm.      Nat Christen LCSW Licensed Clinical Social Worker [place] [phone]

## 2021-07-26 ENCOUNTER — Ambulatory Visit (INDEPENDENT_AMBULATORY_CARE_PROVIDER_SITE_OTHER): Payer: Medicare HMO

## 2021-07-26 ENCOUNTER — Other Ambulatory Visit: Payer: Self-pay

## 2021-07-26 DIAGNOSIS — I619 Nontraumatic intracerebral hemorrhage, unspecified: Secondary | ICD-10-CM | POA: Diagnosis not present

## 2021-07-26 DIAGNOSIS — M50222 Other cervical disc displacement at C5-C6 level: Secondary | ICD-10-CM | POA: Diagnosis not present

## 2021-07-26 DIAGNOSIS — Z8542 Personal history of malignant neoplasm of other parts of uterus: Secondary | ICD-10-CM | POA: Diagnosis not present

## 2021-07-26 DIAGNOSIS — R292 Abnormal reflex: Secondary | ICD-10-CM | POA: Diagnosis not present

## 2021-07-26 DIAGNOSIS — G9589 Other specified diseases of spinal cord: Secondary | ICD-10-CM | POA: Diagnosis not present

## 2021-07-26 DIAGNOSIS — I739 Peripheral vascular disease, unspecified: Secondary | ICD-10-CM | POA: Diagnosis not present

## 2021-07-26 DIAGNOSIS — M50221 Other cervical disc displacement at C4-C5 level: Secondary | ICD-10-CM | POA: Diagnosis not present

## 2021-07-30 ENCOUNTER — Telehealth: Payer: Medicare HMO

## 2021-07-31 ENCOUNTER — Telehealth: Payer: Self-pay | Admitting: Family Medicine

## 2021-07-31 ENCOUNTER — Ambulatory Visit: Payer: Medicare HMO | Admitting: *Deleted

## 2021-07-31 DIAGNOSIS — E669 Obesity, unspecified: Secondary | ICD-10-CM

## 2021-07-31 DIAGNOSIS — R269 Unspecified abnormalities of gait and mobility: Secondary | ICD-10-CM | POA: Diagnosis not present

## 2021-07-31 DIAGNOSIS — R29898 Other symptoms and signs involving the musculoskeletal system: Secondary | ICD-10-CM

## 2021-07-31 DIAGNOSIS — I10 Essential (primary) hypertension: Secondary | ICD-10-CM

## 2021-07-31 DIAGNOSIS — R531 Weakness: Secondary | ICD-10-CM

## 2021-07-31 DIAGNOSIS — G9332 Myalgic encephalomyelitis/chronic fatigue syndrome: Secondary | ICD-10-CM

## 2021-07-31 DIAGNOSIS — I272 Pulmonary hypertension, unspecified: Secondary | ICD-10-CM

## 2021-07-31 DIAGNOSIS — R42 Dizziness and giddiness: Secondary | ICD-10-CM

## 2021-07-31 DIAGNOSIS — R5382 Chronic fatigue, unspecified: Secondary | ICD-10-CM

## 2021-07-31 DIAGNOSIS — Z993 Dependence on wheelchair: Secondary | ICD-10-CM | POA: Diagnosis not present

## 2021-07-31 NOTE — Progress Notes (Signed)
  Care Management  Note   07/31/2021 Name: HAYES REHFELDT MRN: [id number] DOB: January 23, 1951  [name] is a 70 y.o. year old female who is a primary care patient of Metheney, Rene Kocher, MD. The care management team was consulted for assistance with chronic disease management and care coordination needs.   Ms. Hoelting was given information about Care Management services today including:  CCM service includes personalized support from designated clinical staff supervised by the physician, including individualized plan of care and coordination with other care providers 24/7 contact phone numbers for assistance for urgent and routine care needs. Service will only be billed when office clinical staff spend 20 minutes or more in a month to coordinate care. Only one practitioner may furnish and bill the service in a calendar month. The patient may stop CCM services at amy time (effective at the end of the month) by phone call to the office staff. The patient will be responsible for cost sharing (co-pay) or up to 20% of the service fee (after annual deductible is met)  Patient agreed to services and verbal consent obtained.  Follow up plan:   Face to Face appointment with care management team member scheduled for: 08/18/21 _0  pm  Noelle Penner [employer] Scheduler

## 2021-07-31 NOTE — Patient Instructions (Signed)
Visit Information  PATIENT GOALS:  Goals Addressed               This Visit's Progress     Mobility and Independence Optimized. (pt-stated)   On track     Timeframe:  Short-Term Goal Priority:  High Start Date:   06/13/2021                         Expected End Date:   09/12/2021  Follow-Up Date:  08/14/2021 at 12:30pm  Patient Goals/Self-Care Activities: Receive weekly/bi-weekly calls from LCSW in an effort to coordinate skilled nursing placement for short-term rehabilitative services. LCSW collaboration with Primary Care Physician, Dr. [name] to request completion of an FL-2 Form.  Per patient, request to hold-off on FL-2 Form completion until further notice. Keep appointment with Primary Care Physician, Dr. [name], scheduled for 08/04/2021, as she will need to meet with you face-to-face to complete the FL-2 Form. Once completed and signed FL-2 Form is obtained, LCSW will fax to all skilled nursing facilities of interest to try and pursue bed offers for short-term rehabilitative services.  Per patient, request to hold off on faxing out FL-2 Form until further notice. Follow-up with neurologist today to discuss scheduling a surgical procedure to remove the cyst on your spine, preventing you from being able to walk. Contact LCSW directly (# [phone]) if you have questions, need assistance, or if additional social work needs are identified.         Patient verbalizes understanding of instructions provided today and agrees to view in [place].   Telephone follow-up appointment with care management team member scheduled for:  08/14/2021 at 12:30pm.  Nat Christen LCSW Licensed Clinical Social Worker [place] [phone]

## 2021-07-31 NOTE — Chronic Care Management (AMB) (Signed)
Chronic Care Management    Clinical Social Work Note  07/31/2021 Name: Amy Wilson MRN: [id number] DOB: 08-23-1951  Amy Wilson is a 70 y.o. year old female who is a primary care patient of Metheney, Rene Kocher, MD. The CCM team was consulted to assist the patient with chronic disease management and/or care coordination needs related to: Level of Care Concerns.   Engaged with patient by telephone for follow-up visit in response to provider referral for social work chronic care management and care coordination services.   Consent to Services:  The patient was given information about Chronic Care Management services, agreed to services, and gave verbal consent prior to initiation of services.  Please see initial visit note for detailed documentation.   Patient agreed to services and consent obtained.   Assessment: Review of patient past medical history, allergies, medications, and health status, including review of relevant consultants reports was performed today as part of a comprehensive evaluation and provision of chronic care management and care coordination services.     SDOH (Social Determinants of Health) assessments and interventions performed:    Advanced Directives Status: Not addressed in this encounter.  CCM Care Plan  Allergies  Allergen Reactions   Latex Itching and Rash    Outpatient Encounter Medications as of 07/31/2021  Medication Sig Note   calcium carbonate (OS-CAL - DOSED IN MG OF ELEMENTAL CALCIUM) 1250 (500 Ca) MG tablet Take 1 tablet by mouth daily.    esomeprazole (NEXIUM) 20 MG capsule Take 10 mg by mouth daily before breakfast. Take 10 mg 30 minutes before breakfast 06/11/2021: Reports takes one 20 mg capsule every morning   gabapentin (NEURONTIN) 100 MG capsule 1 in AM, 1 midday and 3 at bedtime. 06/11/2021: Reports takes 300 mg capsule 5 times/day.   magnesium oxide (MAG-OX) 400 MG tablet Take by mouth.    nitrofurantoin (MACRODANTIN) 50 MG capsule  Take 1 capsule (50 mg total) by mouth at bedtime. For prophylaxis for bladder infection    ondansetron (ZOFRAN) 8 MG tablet Take by mouth. 06/11/2021: Reports takes prn   oxybutynin (DITROPAN) 5 MG tablet Take 1 tablet by mouth twice daily    PARoxetine (PAXIL) 20 MG tablet Take 1 tablet (20 mg total) by mouth daily.    potassium chloride (KLOR-CON) 10 MEQ tablet Take by mouth. 06/11/2021: Reports takes 1 tablet every day   senna-docusate (SENOKOT-S) 8.6-50 MG tablet Take 1 tablet by mouth daily. 06/11/2021: Reports takes as needed   No facility-administered encounter medications on file as of 07/31/2021.    Patient Active Problem List   Diagnosis Date Noted   Left leg weakness 06/02/2021   Recurrent UTI 11/27/2020   Neutropenia associated with infection ([place]) 11/23/2020   Mixed stress and urge urinary incontinence 10/22/2020   Generalized weakness 10/22/2020   Pancytopenia due to antineoplastic chemotherapy ([place]) 10/13/2020   Cerebellar ataxia in diseases classified elsewhere ([place]) 08/08/2020   Retroperitoneal lymphadenopathy 08/08/2020   Peripheral neuropathic pain 07/22/2020   GERD (gastroesophageal reflux disease) 07/03/2020   Stage 3b chronic kidney disease ([place]) 07/03/2020   Bilateral ureteral obstruction 05/27/2020   Hydronephrosis 05/18/2019   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[place]) 05/03/2019   Endometrial adenocarcinoma ([place]) 03/03/2019   Closed compression fracture of body of L1 vertebra ([place]) 02/24/2019   Age-related osteoporosis with current pathological fracture with routine healing 02/24/2019   Fall 02/23/2019   Moderate to severe pulmonary hypertension ([place]) 02/21/2019  Pelvic mass 02/20/2019   Hiatal hernia 02/20/2019   Fibroid uterus 02/20/2019   Bladder wall thickening 02/20/2019   Aortic atherosclerosis ([place]) 02/20/2019   Obesity (BMI 30-39.9) 10/05/2014   ARM PAIN, LEFT 10/13/2010    INSOMNIA 05/13/2009   DIZZINESS 09/13/2007   Hyperlipidemia 08/19/2007   Depression, recurrent ([place]) 09/28/2006   HYPERTENSION, BENIGN SYSTEMIC 09/28/2006   MENOPAUSAL SYNDROME 09/28/2006   VASOVAGAL SYNCOPE 09/28/2006   FATIGUE/MALAISE 09/28/2006    Conditions to be addressed/monitored: Tumor on Spine Preventing Mobility. Level of Care Concerns, ADL/IADL Limitations, and Limited Access to Caregiver.  Care Plan : LCSW Plan of Care  Updates made by Francis Gaines, LCSW since 07/31/2021 12:00 AM     Problem: Mobility and Independence.   Priority: High     Goal: Mobility and Independence Optimized.   Start Date: 06/13/2021  Expected End Date: 09/12/2021  This Visit's Progress: On track  Recent Progress: On track  Priority: High  Note:   Current Barriers:  Chronic disease management support and education needs related to Mobility Issues, Fall Risk, Endometrial Adenocarcinoma, Depression, Hypertension, Left Leg Weakness, Fatigue, Stage III Chronic Kidney Disease, Osteoporosis, Obesity, Generalized Weakness and Dizziness. 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   Prior interventions have been Ineffective (I.e home health physical and occupational therapy and aquatic therapy) Clinical Goal(s):  Patient will work with LCSW to identify and address any acute and chronic care coordination needs related to the self-health management of Depression, Endometrial Adenocarcinoma, Fatigue, Left Leg Weakness, Dizziness and Osteoporosis.    Patient desires to receive inpatient rehabilitative services in a skilled nursing facility, and will work with LCSW to identify options.   LCSW provided patient with a list of skilled nursing facilities within a 50-mile radius that offer short-term rehabilitative services. LCSW will obtain a completed and signed FL-2 Form from patient's Primary Care Physician, Dr. [name]. Once completed and  signed FL-2 Form is obtained, LCSW agreed to fax to all skilled nursing facilities of interest to try and pursue bed offers. LCSW will submit appropriate paperwork to [facility] to try and obtain prior approval and authorization for short-term skilled nursing placement. LCSW Interventions:  Inter-disciplinary care team collaboration (see longitudinal plan of care). Collaboration with Primary Care Physician, Dr. [name] regarding development and update of comprehensive plan of care as evidenced by provider attestation and co-signature. Performed thorough chart review. Faxed completed and signed FL-2 Form to patient's one facility of interest, [facility], on 06/20/2021.   Left HIPAA compliant voicemail messages for Estill Bamberg, Admissions Director at [facility], requesting to know the status of patient's insurance authorization and bed availability.   Supportive counseling and attentive listening skills were utilized when patient voiced concerns about not having received any return calls from [place], Engineer, site at [facility], after 4 failed attempts.   Collaboration with Embedded RNCM, [name] regarding initiation of placement for patient.   LCSW will continue to try and make contact with Estill Bamberg, Admissions Director at [facility], to coordinate short-term skilled nursing placement for patient. LCSW will continue to try and make contact with a representative at [facility] to obtain prior approval and authorization for short-term skilled nursing placement for patient. Patient Goals/Self-Care Activities: Receive weekly/bi-weekly calls from LCSW in an effort to coordinate skilled nursing placement for short-term rehabilitative services. LCSW collaboration with Primary Care Physician, Dr. [name] to request completion of an FL-2 Form.  Per patient, request to hold-off on FL-2  Form completion until further notice. Keep appointment with Primary Care Physician, Dr.  [name], scheduled for 08/04/2021, as she will need to meet with you face-to-face to complete the FL-2 Form. Once completed and signed FL-2 Form is obtained, LCSW will fax to all skilled nursing facilities of interest to try and pursue bed offers for short-term rehabilitative services.  Per patient, request to hold off on faxing out FL-2 Form until further notice. Follow-up with neurologist today to discuss scheduling a surgical procedure to remove the cyst on your spine, preventing you from being able to walk. Contact LCSW directly (# [phone]) if you have questions, need assistance, or if additional social work needs are identified. Follow-Up Plan:  LCSW will make contact with patient on 08/14/2021 at 12:30pm.      Follow-Up Plan:  LCSW will follow-up with patient by phone on 08/14/2021 at 12:30pm.      Nat Christen LCSW Licensed Clinical Social Worker [place] [phone]

## 2021-08-04 ENCOUNTER — Encounter: Payer: Self-pay | Admitting: Family Medicine

## 2021-08-04 ENCOUNTER — Ambulatory Visit (INDEPENDENT_AMBULATORY_CARE_PROVIDER_SITE_OTHER): Payer: Medicare HMO | Admitting: Family Medicine

## 2021-08-04 ENCOUNTER — Other Ambulatory Visit: Payer: Self-pay

## 2021-08-04 VITALS — BP 110/63 | HR 85 | Ht 65.0 in

## 2021-08-04 DIAGNOSIS — R7309 Other abnormal glucose: Secondary | ICD-10-CM | POA: Diagnosis not present

## 2021-08-04 DIAGNOSIS — Z23 Encounter for immunization: Secondary | ICD-10-CM | POA: Diagnosis not present

## 2021-08-04 DIAGNOSIS — D497 Neoplasm of unspecified behavior of endocrine glands and other parts of nervous system: Secondary | ICD-10-CM | POA: Insufficient documentation

## 2021-08-04 DIAGNOSIS — I10 Essential (primary) hypertension: Secondary | ICD-10-CM

## 2021-08-04 LAB — POCT GLYCOSYLATED HEMOGLOBIN (HGB A1C): Hemoglobin A1C: 5 % (ref 4.0–5.6)

## 2021-08-04 NOTE — Assessment & Plan Note (Signed)
Will likely end up having surgery with Dr. Owens Shark.  They are waiting a consultation I am happy to assist in any way that we can.

## 2021-08-04 NOTE — Assessment & Plan Note (Signed)
Well controlled. Continue current regimen. Follow up in  6 mo  

## 2021-08-04 NOTE — Progress Notes (Signed)
Established Patient Office Visit  Subjective:  Patient ID: Amy Wilson, female    DOB: 06/17/1951  Age: 70 y.o. MRN: [id number]  CC:  Chief Complaint  Patient presents with   Follow-up    HPI FARRIS LANES presents for Left leak weakness.  Unfortunately her symptoms have continued to progress since I last saw her.  Now starting to get some problems with her right leg as well.  She saw Dr. 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 Owens Shark, neurosurgeon for consultation.  They dropped off the CT imaging today.  She is currently on prednisone to reduce inflammation she says she is actually noticed a major improvement in her voice since being on the prednisone in fact it got significantly better today.  Hypertension- Pt denies chest pain, SOB, dizziness, or heart palpitations.  Taking meds as directed w/o problems.  Denies medication side effects.    Elevated glucose-no increased thirst or urination. No symptoms consistent with hypoglycemia.   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 This is most consistent with a meningioma, but contrast administration may be helpful for confirmation.  Past Medical History:  Diagnosis Date   Cancer [facility])    uterine    Past Surgical History:  Procedure Laterality Date   ABDOMINAL HYSTERECTOMY     kidney stents     KNEE ARTHROSCOPY Right     Family History  Problem Relation Age of Onset   Heart disease Father    Colon cancer Maternal Grandmother    Stomach cancer Maternal Grandmother     Social History   Socioeconomic History   Marital status: Divorced    Spouse name: Not on file    Number of children: 2   Years of education: 12   Highest education level: 12th grade  Occupational History   Occupation: Scientist, research (medical)    Comment: retired  Tobacco Use   Smoking status: Never   Smokeless tobacco: Never  Scientific laboratory technician Use: Never used  Substance and Sexual Activity   Alcohol use: Never   Drug use: No   Sexual activity: Never  Other Topics Concern   Not on file  Social History Narrative   Patient has cancer and doing chemo and radiation. Stays tired all the time   Lives with daughter and son-in-law   Social Determinants of Health   Financial Resource Strain: Low Risk    Difficulty of Paying Living Expenses: Not hard at all  Food Insecurity: No Food Insecurity   Worried About Charity fundraiser in the Last Year: Never true   Arboriculturist in the Last Year: Never true  Transportation Needs: No Transportation Needs   Lack of Transportation (Medical): No   Lack of Transportation (Non-Medical): No  Physical Activity: Inactive   Days of Exercise per Week: 0 days   Minutes of Exercise per Session: 0 min  Stress: No Stress Concern Present   Feeling of Stress : Only a little  Social Connections: Socially Isolated   Frequency of Communication with Friends and Family: More than three times a week   Frequency of Social Gatherings with Friends and  Family: More than three times a week   Attends Religious Services: Never   Active Member of Clubs or Organizations: No   Attends Archivist Meetings: Never   Marital Status: Divorced  Human resources officer Violence: Not At Risk   Fear of Current or Ex-Partner: No   Emotionally Abused: No   Physically Abused: No   Sexually Abused: No    Outpatient Medications Prior to Visit  Medication Sig Dispense Refill   gabapentin (NEURONTIN) 300 MG capsule Take 300 mg by mouth in the morning, at noon, and at bedtime.     predniSONE (DELTASONE) 20 MG tablet Take 20 mg by mouth every morning.     calcium carbonate (OS-CAL -  DOSED IN MG OF ELEMENTAL CALCIUM) 1250 (500 Ca) MG tablet Take 1 tablet by mouth daily.     esomeprazole (NEXIUM) 20 MG capsule Take 10 mg by mouth daily before breakfast. Take 10 mg 30 minutes before breakfast     magnesium oxide (MAG-OX) 400 MG tablet Take by mouth.     nitrofurantoin (MACRODANTIN) 50 MG capsule Take 1 capsule (50 mg total) by mouth at bedtime. For prophylaxis for bladder infection 90 capsule 0   ondansetron (ZOFRAN) 8 MG tablet Take by mouth.     oxybutynin (DITROPAN) 5 MG tablet Take 1 tablet by mouth twice daily 60 tablet 5   PARoxetine (PAXIL) 20 MG tablet Take 1 tablet (20 mg total) by mouth daily. 90 tablet 1   potassium chloride (KLOR-CON) 10 MEQ tablet Take by mouth.     senna-docusate (SENOKOT-S) 8.6-50 MG tablet Take 1 tablet by mouth daily.     gabapentin (NEURONTIN) 100 MG capsule 1 in AM, 1 midday and 3 at bedtime. 150 capsule 5   No facility-administered medications prior to visit.    Allergies  Allergen Reactions   Latex Itching and Rash    ROS Review of Systems    Objective:    Physical Exam Constitutional:      Appearance: Normal appearance. She is well-developed.  HENT:     Head: Normocephalic and atraumatic.  Cardiovascular:     Rate and Rhythm: Normal rate and regular rhythm.     Heart sounds: Normal heart sounds.  Pulmonary:     Effort: Pulmonary effort is normal.     Breath sounds: Normal breath sounds.  Skin:    General: Skin is warm and dry.  Neurological:     Mental Status: She is alert and oriented to person, place, and time.  Psychiatric:        Behavior: Behavior normal.    BP 110/63   Pulse 85   Ht '5\' 5"'$  (1.651 m)   SpO2 99%   BMI 24.13 kg/m  Wt Readings from Last 3 Encounters:  04/21/21 145 lb (65.8 kg)  03/06/21 145 lb (65.8 kg)  08/08/20 152 lb (68.9 kg)     Health Maintenance Due  Topic Date Due   TETANUS/TDAP  Never done   Zoster Vaccines- Shingrix (1 of 2) Never done   COLONOSCOPY (Pts 45-59yr  Insurance coverage will need to be confirmed)  Never done   MAMMOGRAM  03/04/2019   INFLUENZA VACCINE  07/21/2021    There are no preventive care reminders to display for this patient.  Lab Results  Component Value Date   TSH 3.70 03/05/2020   Lab Results  Component Value Date   WBC 7.2 03/05/2020   HGB 13.1 03/05/2020   HCT 39.8 03/05/2020   MCV 88.6 03/05/2020  PLT 158 03/05/2020   Lab Results  Component Value Date   NA 139 04/22/2021   K 3.6 04/22/2021   CO2 24 04/22/2021   GLUCOSE 122 (H) 04/22/2021   BUN 21 04/22/2021   CREATININE 0.99 (H) 04/22/2021   BILITOT 0.3 04/22/2021   ALKPHOS 81 02/19/2017   AST 14 04/22/2021   ALT 9 04/22/2021   PROT 7.4 04/22/2021   ALBUMIN 4.2 02/19/2017   CALCIUM 9.4 04/22/2021   Lab Results  Component Value Date   CHOL 174 04/22/2021   Lab Results  Component Value Date   HDL 39 (L) 04/22/2021   Lab Results  Component Value Date   LDLCALC 108 (H) 04/22/2021   Lab Results  Component Value Date   TRIG 157 (H) 04/22/2021   Lab Results  Component Value Date   CHOLHDL 4.5 04/22/2021   Lab Results  Component Value Date   HGBA1C 5.0 08/04/2021      Assessment & Plan:   Problem List Items Addressed This Visit       Cardiovascular and Mediastinum   HYPERTENSION, BENIGN SYSTEMIC    Well controlled. Continue current regimen. Follow up in  6 mo         Nervous and Auditory   Meningeal tumor    Will likely end up having surgery with Dr. Owens Shark.  They are waiting a consultation I am happy to assist in any way that we can.      Other Visit Diagnoses     Need for pneumococcal vaccination    -  Primary   Relevant Orders   Pneumococcal conjugate vaccine 20-valent (Prevnar 20) (Completed)   Abnormal glucose       Relevant Orders   POCT glycosylated hemoglobin (Hb A1C) (Completed)      Meningeal tumor-hopefully will get an appointment later this week with Dr. Owens Shark, neurosurgical consultation but they do feel like  this is the most likely cause of her left leg weakness.  Abnormal glucose-hemoglobin A1c looks phenomenal today so really encouraged her to liberalize her diet and try to really work on increasing her protein intake.  Prevnar 20 given today.    No orders of the defined types were placed in this encounter.   Follow-up: No follow-ups on file.    [name], MD

## 2021-08-04 NOTE — Patient Instructions (Signed)
Please get your Tdap updated at the pharmacy.

## 2021-08-04 NOTE — Progress Notes (Signed)
Pt reports that Dr. Trula Ore discovered a Tumor on

## 2021-08-05 DIAGNOSIS — D492 Neoplasm of unspecified behavior of bone, soft tissue, and skin: Secondary | ICD-10-CM | POA: Diagnosis not present

## 2021-08-06 ENCOUNTER — Ambulatory Visit: Payer: Medicare HMO

## 2021-08-06 DIAGNOSIS — F339 Major depressive disorder, recurrent, unspecified: Secondary | ICD-10-CM

## 2021-08-06 DIAGNOSIS — D497 Neoplasm of unspecified behavior of endocrine glands and other parts of nervous system: Secondary | ICD-10-CM

## 2021-08-06 DIAGNOSIS — E785 Hyperlipidemia, unspecified: Secondary | ICD-10-CM

## 2021-08-06 NOTE — Chronic Care Management (AMB) (Addendum)
Chronic Care Management   CCM RN Visit Note  08/06/2021 Name: Amy Wilson MRN: [id number] DOB: 1951-10-21  Subjective: Amy Wilson is a 70 y.o. year old female who is a primary care patient of Metheney, Rene Kocher, MD. The care management team was consulted for assistance with disease management and care coordination needs.    Engaged with patient by telephone for follow up visit in response to provider referral for case management and/or care coordination services.   Consent to Services:  The patient was given information about Chronic Care Management services, agreed to services, and gave verbal consent prior to initiation of services.  Please see initial visit note for detailed documentation.   Patient agreed to services and verbal consent obtained.   Assessment: Review of patient past medical history, allergies, medications, health status, including review of consultants reports, laboratory and other test data, was performed as part of comprehensive evaluation and provision of chronic care management services.   SDOH (Social Determinants of Health) assessments and interventions performed:    CCM Care Plan  Allergies  Allergen Reactions   Latex Itching and Rash    Outpatient Encounter Medications as of 08/06/2021  Medication Sig Note   calcium carbonate (OS-CAL - DOSED IN MG OF ELEMENTAL CALCIUM) 1250 (500 Ca) MG tablet Take 1 tablet by mouth daily.    esomeprazole (NEXIUM) 20 MG capsule Take 10 mg by mouth daily before breakfast. Take 10 mg 30 minutes before breakfast 06/11/2021: Reports takes one 20 mg capsule every morning   gabapentin (NEURONTIN) 300 MG capsule Take 300 mg by mouth in the morning, at noon, and at bedtime.    magnesium oxide (MAG-OX) 400 MG tablet Take by mouth.    nitrofurantoin (MACRODANTIN) 50 MG capsule Take 1 capsule (50 mg total) by mouth at bedtime. For prophylaxis for bladder infection    ondansetron (ZOFRAN) 8 MG tablet Take by mouth. 06/11/2021:  Reports takes prn   oxybutynin (DITROPAN) 5 MG tablet Take 1 tablet by mouth twice daily    PARoxetine (PAXIL) 20 MG tablet Take 1 tablet (20 mg total) by mouth daily.    potassium chloride (KLOR-CON) 10 MEQ tablet Take by mouth. 06/11/2021: Reports takes 1 tablet every day   predniSONE (DELTASONE) 20 MG tablet Take 20 mg by mouth every morning.    senna-docusate (SENOKOT-S) 8.6-50 MG tablet Take 1 tablet by mouth daily. 06/11/2021: Reports takes as needed   No facility-administered encounter medications on file as of 08/06/2021.    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   Left leg weakness 06/02/2021   Recurrent UTI 11/27/2020   Neutropenia associated with infection ([place]) 11/23/2020   Mixed stress and urge urinary incontinence 10/22/2020   Generalized weakness 10/22/2020   Pancytopenia due to antineoplastic chemotherapy ([place]) 10/13/2020   Cerebellar ataxia in diseases classified elsewhere ([place]) 08/08/2020   Retroperitoneal lymphadenopathy 08/08/2020   Peripheral neuropathic pain 07/22/2020   GERD (gastroesophageal reflux disease) 07/03/2020   Stage 3b chronic kidney disease ([place]) 07/03/2020   Bilateral ureteral obstruction 05/27/2020   Hydronephrosis 05/18/2019   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[place]) 05/03/2019   Endometrial adenocarcinoma ([place]) 03/03/2019   Closed compression fracture of body of L1 vertebra ([place]) 02/24/2019   Age-related osteoporosis with current pathological fracture with routine healing 02/24/2019   Fall 02/23/2019   Moderate to severe pulmonary hypertension ([place]) 02/21/2019   Pelvic mass 02/20/2019  Hiatal hernia 02/20/2019   Fibroid uterus 02/20/2019   Bladder wall thickening 02/20/2019   Aortic atherosclerosis ([place]) 02/20/2019   Obesity (BMI 30-39.9) 10/05/2014   ARM PAIN, LEFT 10/13/2010   INSOMNIA 05/13/2009   DIZZINESS 09/13/2007    Hyperlipidemia 08/19/2007   Depression, recurrent ([place]) 09/28/2006   HYPERTENSION, BENIGN SYSTEMIC 09/28/2006   MENOPAUSAL SYNDROME 09/28/2006   VASOVAGAL SYNCOPE 09/28/2006   FATIGUE/MALAISE 09/28/2006    Conditions to be addressed/monitored:HLD and Depression  Care Plan : General Plan of Care (Adult)  Updates made by Luretha Rued, RN since 08/06/2021 12:00 AM     Problem: Development of [place] for self management of health in a patient with history of endometrial cancer; limited mobility; fall risk   Priority: High     Long-Range Goal: Self-Management Plan Developed   Start Date: 06/11/2021  Expected End Date: 09/11/2021  This Visit's Progress: On track  Recent Progress: On track  Priority: High  Note:   Current Barriers:  Knowledge Deficits related to long term care plan for management of health in a patient with a history of metastatic endometrial cancer, limited mobility and fall risk, left leg weakness, depression, kidney stent, Chronic pain syndrome and HTN. Chronic pain syndrome managed by [facility]. Client reports HTN is not an issue and reports she has been taken off all HTN medications. Declines education regarding HTN management. Client lives with her daughter and son- in law who she describes as very supportive.  Recent diagnosis of meningeal tumor spinal cord T3-T4 affecting mobility, sensation. She reports they are in the process of determining if surgery is recommended. [name] states she wants to have the surgery to have it removed. Primary focus for [name] at this time. Last office visit with primary care provider 07/31/21. Unable to independently perform ADL's, transfer, ambulate.  Short term rehab from home on hold at this time - Awaiting call from surgeon regarding plan of care for management of tumor, possible surgery. Nurse Case Manager Clinical Goal(s):  patient will work with CM clinical social worker regarding community  resource needs the patient will demonstrate ongoing self health-care management ability Patient will work with providers as recommended Interventions:  1:1 collaboration with Hali Marry, MD regarding development and update of comprehensive plan of care as evidenced by provider attestation and co-signature Inter-disciplinary care team collaboration (see longitudinal plan of care) Evaluation of current treatment plan related to patient's health and patient's adherence to plan as established by provider. Reviewed medications with patient and encouraged to continue to take as prescribed. Reviewed scheduled/upcoming provider appointments.  Reiterated importance of maintaining fall prevention strategies Discussed and reinforced the importance of good skin care and mobility in prevention of skin breakdown. Discussed diet and encouraged healthy foods.  Discussed plans with patient for ongoing care management follow up and provided patient with direct contact information for care management team Patient Goals/Self-Care Activities - continue to attend all scheduled provider appointments - continue to maintain good skin care - continue to take medications as prescribed - plan to eat healthy meals-lean meats or protein from other sources such as beans, fruits, vegetables, healthy fats - call provider office for new concerns or questions - continue to work with care management clinical team to address health care and disease management related needs.   Follow Up Plan: Telephone follow up appointment with care management team member scheduled for: 08/27/21.    Plan:The patient has been provided with contact information for the care  management team and has been advised to call with any health related questions or concerns.  and The care management team will reach out to the patient again over the next 30 days.  [name], RN, MSN, BSN, CCM Care Management Coordinator [facility]  [place] [facility] [phone]

## 2021-08-06 NOTE — Patient Instructions (Addendum)
Visit Information  PATIENT GOALS:  Goals Addressed             This Visit's Progress    Develop long term care plan for managment of health   On track    Timeframe:  Long-Range Goal Priority:  High Start Date:  06/11/21                           Expected End Date: 12/06/21                     Follow Up Date 08/27/21   Patient Goals: - continue to attend all scheduled provider appointments - continue to maintain good skin care - continue to take medications as prescribed - plan to eat healthy meals-lean meats or protein from other sources such as beans, fruits, vegetables, healthy fats - call provider office for new concerns or questions - continue to work with care management clinical team to address health care and disease management related needs.          Healthy Eating Following a healthy eating pattern may help you to achieve and maintain a healthy body weight, reduce the risk of chronic disease, and live a long and productive life. It is important to follow a healthy eating pattern at an appropriate calorie level for your body. Your nutritional needs should be metprimarily through food by choosing a variety of nutrient-rich foods. What are tips for following this plan? Reading food labels Read labels and choose the following: Reduced or low sodium. Juices with 100% fruit juice. Foods with low saturated fats and high polyunsaturated and monounsaturated fats. Foods with whole grains, such as whole wheat, cracked wheat, brown rice, and wild rice. Whole grains that are fortified with folic acid. This is recommended for women who are pregnant or who want to become pregnant. Read labels and avoid the following: Foods with a lot of added sugars. These include foods that contain brown sugar, corn sweetener, corn syrup, dextrose, fructose, glucose, high-fructose corn syrup, honey, invert sugar, lactose, malt syrup, maltose, molasses, raw sugar, sucrose, trehalose, or turbinado  sugar. Do not eat more than the following amounts of added sugar per day: 6 teaspoons (25 g) for women. 9 teaspoons (38 g) for men. Foods that contain processed or refined starches and grains. Refined grain products, such as white flour, degermed cornmeal, white bread, and white rice. Shopping Choose nutrient-rich snacks, such as vegetables, whole fruits, and nuts. Avoid high-calorie and high-sugar snacks, such as potato chips, fruit snacks, and candy. Use oil-based dressings and spreads on foods instead of solid fats such as butter, stick margarine, or cream cheese. Limit pre-made sauces, mixes, and "instant" products such as flavored rice, instant noodles, and ready-made pasta. Try more plant-protein sources, such as tofu, tempeh, black beans, edamame, lentils, nuts, and seeds. Explore eating plans such as the Mediterranean diet or vegetarian diet. Cooking Use oil to saut or stir-fry foods instead of solid fats such as butter, stick margarine, or lard. Try baking, boiling, grilling, or broiling instead of frying. Remove the fatty part of meats before cooking. Steam vegetables in water or broth. Meal planning  At meals, imagine dividing your plate into fourths: One-half of your plate is fruits and vegetables. One-fourth of your plate is whole grains. One-fourth of your plate is protein, especially lean meats, poultry, eggs, tofu, beans, or nuts. Include low-fat dairy as part of your daily diet.  Lifestyle Choose healthy options  in all settings, including home, work, school, restaurants, or stores. Prepare your food safely: Wash your hands after handling raw meats. Keep food preparation surfaces clean by regularly washing with hot, soapy water. Keep raw meats separate from ready-to-eat foods, such as fruits and vegetables. Cook seafood, meat, poultry, and eggs to the recommended internal temperature. Store foods at safe temperatures. In general: Keep cold foods at 48F (4.4C) or  below. Keep hot foods at 148F (60C) or above. Keep your freezer at [facility] (-17.8C) or below. Foods are no longer safe to eat when they have been between the temperatures of 40-148F (4.4-60C) for more than 2 hours. What foods should I eat? Fruits Aim to eat 2 cup-equivalents of fresh, canned (in natural juice), or frozen fruits each day. Examples of 1 cup-equivalent of fruit include 1 small apple, 8large strawberries, 1 cup canned fruit,  cup dried fruit, or 1 cup 100% juice. Vegetables Aim to eat 2-3 cup-equivalents of fresh and frozen vegetables each day, including different varieties and colors. Examples of 1 cup-equivalent of vegetables include 2 medium carrots, 2 cups raw, leafy greens, 1 cup choppedvegetable (raw or cooked), or 1 medium baked potato. Grains Aim to eat 6 ounce-equivalents of whole grains each day. Examples of 1 ounce-equivalent of grains include 1 slice of bread, 1 cup ready-to-eat cereal,3 cups popcorn, or  cup cooked rice, pasta, or cereal. Meats and other proteins Aim to eat 5-6 ounce-equivalents of protein each day. Examples of 1 ounce-equivalent of protein include 1 egg, 1/2 cup nuts or seeds, or 1 tablespoon (16 g) peanut butter. A cut of meat or fish that is the size of a deck of cards is about 3-4 ounce-equivalents. Of the protein you eat each week, try to have at least 8 ounces come from seafood. This includes salmon, trout, herring, and anchovies. Dairy Aim to eat 3 cup-equivalents of fat-free or low-fat dairy each day. Examples of 1 cup-equivalent of dairy include 1 cup (240 mL) milk, 8 ounces (250 g) yogurt,1 ounces (44 g) natural cheese, or 1 cup (240 mL) fortified soy milk. Fats and oils Aim for about 5 teaspoons (21 g) per day. Choose monounsaturated fats, such as canola and olive oils, avocados, peanut butter, and most nuts, or polyunsaturated fats, such as sunflower, corn, and soybean oils, walnuts, pine nuts, sesame seeds, sunflower seeds, and  flaxseed. Beverages Aim for six 8-oz glasses of water per day. Limit coffee to three to five 8-oz cups per day. Limit caffeinated beverages that have added calories, such as soda and energy drinks. Limit alcohol intake to no more than 1 drink a day for nonpregnant women and 2 drinks a day for men. One drink equals 12 oz of beer (355 mL), 5 oz of wine (148 mL), or 1 oz of hard liquor (44 mL). Seasoning and other foods Avoid adding excess amounts of salt to your foods. Try flavoring foods with herbs and spices instead of salt. Avoid adding sugar to foods. Try using oil-based dressings, sauces, and spreads instead of solid fats. This information is based on general [place] nutrition guidelines. For more information, visit Build[employer].es. Exact amounts may vary based on your nutrition needs. Summary A healthy eating plan may help you to maintain a healthy weight, reduce the risk of chronic diseases, and stay active throughout your life. Plan your meals. Make sure you eat the right portions of a variety of nutrient-rich foods. Try baking, boiling, grilling, or broiling instead of frying. Choose healthy options in all settings, including  home, work, school, restaurants, or stores. This information is not intended to replace advice given to you by your health care provider. Make sure you discuss any questions you have with your healthcare provider. Document Revised: 03/21/2018 Document Reviewed: 03/21/2018 Elsevier Patient Education  2022 [place].   Patient verbalizes understanding of instructions provided today and agrees to view in [place].   Telephone follow up appointment with care management team member scheduled for:08/27/21 The patient has been provided with contact information for the care management team and has been advised to call with any health related questions or concerns.   [name], RN, MSN, BSN, CCM Care Management Coordinator [facility] [place] [facility] [phone]

## 2021-08-13 DIAGNOSIS — C541 Malignant neoplasm of endometrium: Secondary | ICD-10-CM | POA: Diagnosis not present

## 2021-08-13 DIAGNOSIS — Z01810 Encounter for preprocedural cardiovascular examination: Secondary | ICD-10-CM | POA: Diagnosis not present

## 2021-08-13 DIAGNOSIS — Z79899 Other long term (current) drug therapy: Secondary | ICD-10-CM | POA: Diagnosis not present

## 2021-08-13 DIAGNOSIS — M549 Dorsalgia, unspecified: Secondary | ICD-10-CM | POA: Diagnosis not present

## 2021-08-13 DIAGNOSIS — Z01812 Encounter for preprocedural laboratory examination: Secondary | ICD-10-CM | POA: Diagnosis not present

## 2021-08-13 DIAGNOSIS — R300 Dysuria: Secondary | ICD-10-CM | POA: Diagnosis not present

## 2021-08-13 DIAGNOSIS — C772 Secondary and unspecified malignant neoplasm of intra-abdominal lymph nodes: Secondary | ICD-10-CM | POA: Diagnosis not present

## 2021-08-13 DIAGNOSIS — I272 Pulmonary hypertension, unspecified: Secondary | ICD-10-CM | POA: Diagnosis not present

## 2021-08-13 DIAGNOSIS — D497 Neoplasm of unspecified behavior of endocrine glands and other parts of nervous system: Secondary | ICD-10-CM | POA: Diagnosis not present

## 2021-08-13 DIAGNOSIS — Z01818 Encounter for other preprocedural examination: Secondary | ICD-10-CM | POA: Diagnosis not present

## 2021-08-13 DIAGNOSIS — R791 Abnormal coagulation profile: Secondary | ICD-10-CM | POA: Diagnosis not present

## 2021-08-14 ENCOUNTER — Ambulatory Visit: Payer: Medicare HMO | Admitting: *Deleted

## 2021-08-14 DIAGNOSIS — I272 Pulmonary hypertension, unspecified: Secondary | ICD-10-CM

## 2021-08-14 DIAGNOSIS — S32010A Wedge compression fracture of first lumbar vertebra, initial encounter for closed fracture: Secondary | ICD-10-CM

## 2021-08-14 DIAGNOSIS — D497 Neoplasm of unspecified behavior of endocrine glands and other parts of nervous system: Secondary | ICD-10-CM

## 2021-08-14 DIAGNOSIS — M792 Neuralgia and neuritis, unspecified: Secondary | ICD-10-CM

## 2021-08-14 DIAGNOSIS — M8000XD Age-related osteoporosis with current pathological fracture, unspecified site, subsequent encounter for fracture with routine healing: Secondary | ICD-10-CM

## 2021-08-14 DIAGNOSIS — G9332 Myalgic encephalomyelitis/chronic fatigue syndrome: Secondary | ICD-10-CM

## 2021-08-14 DIAGNOSIS — G3281 Cerebellar ataxia in diseases classified elsewhere: Secondary | ICD-10-CM

## 2021-08-14 DIAGNOSIS — N1832 Chronic kidney disease, stage 3b: Secondary | ICD-10-CM

## 2021-08-14 DIAGNOSIS — R5382 Chronic fatigue, unspecified: Secondary | ICD-10-CM

## 2021-08-14 DIAGNOSIS — E669 Obesity, unspecified: Secondary | ICD-10-CM

## 2021-08-14 DIAGNOSIS — E785 Hyperlipidemia, unspecified: Secondary | ICD-10-CM

## 2021-08-14 DIAGNOSIS — R531 Weakness: Secondary | ICD-10-CM

## 2021-08-14 DIAGNOSIS — R42 Dizziness and giddiness: Secondary | ICD-10-CM

## 2021-08-14 NOTE — Chronic Care Management (AMB) (Signed)
Chronic Care Management    Clinical Social Work Note  08/14/2021 Name: Amy Wilson MRN: [id number] DOB: [birth year]  Amy Wilson is a 70 y.o. year old female who is a primary care patient of Metheney, Rene Kocher, MD. The CCM team was consulted to assist the patient with chronic disease management and/or care coordination needs related to: Level of Care Concerns.   Engaged with patient by telephone for follow-up visit in response to provider referral for social work chronic care management and care coordination services.   Consent to Services:  The patient was given information about Chronic Care Management services, agreed to services, and gave verbal consent prior to initiation of services.  Please see initial visit note for detailed documentation.   Patient agreed to services and consent obtained.   Assessment: Review of patient past medical history, allergies, medications, and health status, including review of relevant consultants reports was performed today as part of a comprehensive evaluation and provision of chronic care management and care coordination services.     SDOH (Social Determinants of Health) assessments and interventions performed:    Advanced Directives Status: Not addressed in this encounter.  CCM Care Plan  Allergies  Allergen Reactions   Latex Itching and Rash    Outpatient Encounter Medications as of 08/14/2021  Medication Sig Note   calcium carbonate (OS-CAL - DOSED IN MG OF ELEMENTAL CALCIUM) 1250 (500 Ca) MG tablet Take 1 tablet by mouth daily.    esomeprazole (NEXIUM) 20 MG capsule Take 10 mg by mouth daily before breakfast. Take 10 mg 30 minutes before breakfast 06/11/2021: Reports takes one 20 mg capsule every morning   gabapentin (NEURONTIN) 300 MG capsule Take 300 mg by mouth in the morning, at noon, and at bedtime.    magnesium oxide (MAG-OX) 400 MG tablet Take by mouth.    nitrofurantoin (MACRODANTIN) 50 MG capsule Take 1 capsule (50 mg total)  by mouth at bedtime. For prophylaxis for bladder infection    ondansetron (ZOFRAN) 8 MG tablet Take by mouth. 06/11/2021: Reports takes prn   oxybutynin (DITROPAN) 5 MG tablet Take 1 tablet by mouth twice daily    PARoxetine (PAXIL) 20 MG tablet Take 1 tablet (20 mg total) by mouth daily.    potassium chloride (KLOR-CON) 10 MEQ tablet Take by mouth. 06/11/2021: Reports takes 1 tablet every day   predniSONE (DELTASONE) 20 MG tablet Take 20 mg by mouth every morning.    senna-docusate (SENOKOT-S) 8.6-50 MG tablet Take 1 tablet by mouth daily. 06/11/2021: Reports takes as needed   No facility-administered encounter medications on file as of 08/14/2021.    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   Left leg weakness 06/02/2021   Recurrent UTI 11/27/2020   Neutropenia associated with infection ([place]) 11/23/2020   Mixed stress and urge urinary incontinence 10/22/2020   Generalized weakness 10/22/2020   Pancytopenia due to antineoplastic chemotherapy ([place]) 10/13/2020   Cerebellar ataxia in diseases classified elsewhere ([place]) 08/08/2020   Retroperitoneal lymphadenopathy 08/08/2020   Peripheral neuropathic pain 07/22/2020   GERD (gastroesophageal reflux disease) 07/03/2020   Stage 3b chronic kidney disease ([place]) 07/03/2020   Bilateral ureteral obstruction 05/27/2020   Hydronephrosis 05/18/2019   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[place]) 05/03/2019   Endometrial adenocarcinoma ([place]) 03/03/2019   Closed compression fracture of body of L1 vertebra ([place]) 02/24/2019   Age-related osteoporosis with current pathological fracture with routine  healing 02/24/2019   Fall 02/23/2019   Moderate to severe pulmonary hypertension ([place]) 02/21/2019   Pelvic mass 02/20/2019   Hiatal hernia 02/20/2019   Fibroid uterus 02/20/2019   Bladder wall thickening 02/20/2019   Aortic atherosclerosis ([place]) 02/20/2019    Obesity (BMI 30-39.9) 10/05/2014   ARM PAIN, LEFT 10/13/2010   INSOMNIA 05/13/2009   DIZZINESS 09/13/2007   Hyperlipidemia 08/19/2007   Depression, recurrent ([place]) 09/28/2006   HYPERTENSION, BENIGN SYSTEMIC 09/28/2006   MENOPAUSAL SYNDROME 09/28/2006   VASOVAGAL SYNCOPE 09/28/2006   FATIGUE/MALAISE 09/28/2006    Conditions to be addressed/monitored: HTN and CKD Stage III.  Level of Care Concerns, ADL/IADL Limitations, Limited Access to Caregiver, and Lacks Knowledge of [employer].  Care Plan : LCSW Plan of Care  Updates made by Francis Gaines, LCSW since 08/14/2021 12:00 AM     Problem: Mobility and Independence.   Priority: High     Goal: Mobility and Independence Optimized.   Start Date: 06/13/2021  Expected End Date: 09/12/2021  This Visit's Progress: On track  Recent Progress: On track  Priority: High  Note:   Current Barriers:  Chronic disease management support and education needs related to Mobility Issues, Fall Risk, Endometrial Adenocarcinoma, Depression, Hypertension, Left Leg Weakness, Fatigue, Stage III Chronic Kidney Disease, Osteoporosis, Obesity, Generalized Weakness and Dizziness. 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   Prior interventions have been Ineffective (I.e home health physical and occupational therapy and aquatic therapy) Clinical Goal(s):  Patient will work with LCSW to identify and address any acute and chronic care coordination needs related to the self-health management of Depression, Endometrial Adenocarcinoma, Fatigue, Left Leg Weakness, Dizziness and Osteoporosis.    LCSW Interventions:  Inter-disciplinary care team collaboration (see longitudinal plan of care). Collaboration with Primary Care Physician, Dr. [name] regarding development and update of comprehensive plan of care as evidenced by provider attestation and co-signature. Performed thorough chart  review. Patient Goals/Self-Care Activities: Undergo Thoracic Laminectomy of T - 1, 2, 3, 4 and Resection of Thoracic Tumor Lateral Fusion of T - 1 thru T - 4 on 08/21/2021.  Upon discharge from the hospital after your medical procedure/recovery, LCSW will ensure that you receive placement into a skilled nursing facility for short-term rehabilitative services. Contact LCSW directly (# [phone]) if you have questions, need assistance, or if additional social work needs are identified between now and our next scheduled follow-up outreach call. Follow-Up Plan:  LCSW will make contact with patient on 08/22/2021 at 10:00am      Follow-Up Plan:  08/22/2021 at 10:00am  [place] Worker [place] [phone]

## 2021-08-14 NOTE — Patient Instructions (Signed)
Visit Information  PATIENT GOALS:  Goals Addressed               This Visit's Progress     Mobility and Independence Optimized. (pt-stated)   On track     Timeframe:  Short-Term Goal Priority:  High Start Date:   06/13/2021                         Expected End Date:   09/12/2021  Follow-Up Date:  08/22/2021 at 10:00am  Patient Goals/Self-Care Activities: Undergo Thoracic Laminectomy of T - 1, 2, 3, 4 and Resection of Thoracic Tumor Lateral Fusion of T - 1 thru T - 4 on 08/21/2021.  Upon discharge from the hospital after your medical procedure/recovery, LCSW will ensure that you receive placement into a skilled nursing facility for short-term rehabilitative services. Contact LCSW directly (# [phone]) if you have questions, need assistance, or if additional social work needs are identified between now and our next scheduled follow-up outreach call.        Patient verbalizes understanding of instructions provided today and agrees to view in [place].   Telephone follow-up appointment with care management team member scheduled for:  08/22/2021 at 10:00am  [place] Licensed Clinical Social Worker [place] [phone]

## 2021-08-16 DIAGNOSIS — S32000A Wedge compression fracture of unspecified lumbar vertebra, initial encounter for closed fracture: Secondary | ICD-10-CM | POA: Diagnosis not present

## 2021-08-18 ENCOUNTER — Ambulatory Visit: Payer: Medicare HMO | Admitting: Pharmacist

## 2021-08-18 ENCOUNTER — Other Ambulatory Visit: Payer: Self-pay

## 2021-08-18 DIAGNOSIS — F339 Major depressive disorder, recurrent, unspecified: Secondary | ICD-10-CM

## 2021-08-18 DIAGNOSIS — I10 Essential (primary) hypertension: Secondary | ICD-10-CM

## 2021-08-18 DIAGNOSIS — M792 Neuralgia and neuritis, unspecified: Secondary | ICD-10-CM

## 2021-08-18 NOTE — Progress Notes (Signed)
Chronic Care Management Pharmacy Note  08/18/2021 Name:  Amy Wilson MRN:  [id number] DOB:  08/06/1951  Summary: addressed HTN, mental health, chronic pain. Patient has upcoming surgery 08/20/21.  Recommendations/Changes made from today's visit: none, but will readdress optimizing meds to support her mental health & pain control in future visits.  Plan: f/u with pharmacist in 1 month  Subjective: Amy Wilson is an 70 y.o. year old female who is a primary patient of Metheney, Rene Kocher, MD.  The CCM team was consulted for assistance with disease management and care coordination needs.    Engaged with patient by telephone for initial visit in response to provider referral for pharmacy case management and/or care coordination services.   Consent to Services:  The patient was given information about Chronic Care Management services, agreed to services, and gave verbal consent prior to initiation of services.  Please see initial visit note for detailed documentation.   Patient Care Team: Hali Marry, MD as PCP - General Luretha Rued, RN as Case Manager [place], Maree Erie, LCSW as Social Worker (Licensed Clinical Social Worker) Darius Bump, [facility] as Pharmacist (Pharmacist)  Recent office visits: 08/04/21 - Dr Madilyn Fireman (PCP)- pneumococcal vaccine, HTN f/u, meningeal tumor/pain.   Recent consult visits: 08/14/21 - Nat Christen (CCM Social worker), assisting through journey/placement needs after upcoming surgery  08/06/21 - [name] (Case Mgr), community resource needs  Hospital visits: None in previous 6 months  Objective:  Lab Results  Component Value Date   CREATININE 0.99 (H) 04/22/2021   CREATININE 0.97 (H) 03/05/2020   CREATININE 0.87 05/20/2018    Lab Results  Component Value Date   HGBA1C 5.0 08/04/2021   Last diabetic Eye exam: No results found for: HMDIABEYEEXA  Last diabetic Foot exam: No results found for: HMDIABFOOTEX       Component Value Date/Time   CHOL 174 04/22/2021 1535   TRIG 157 (H) 04/22/2021 1535   HDL 39 (L) 04/22/2021 1535   CHOLHDL 4.5 04/22/2021 1535   LDLCALC 108 (H) 04/22/2021 1535    Hepatic Function Latest Ref Rng & Units 04/22/2021 03/05/2020 05/20/2018  Total Protein 6.1 - 8.1 g/dL 7.4 7.2 7.7  Albumin 3.6 - 5.1 g/dL - - -  AST 10 - 35 U/L _0 ALT 6 - 29 U/L _1 Alk Phosphatase 33 - 130 U/L - - -  Total Bilirubin 0.2 - 1.2 mg/dL 0.3 0.5 0.5    Lab Results  Component Value Date/Time   TSH 3.70 03/05/2020 12:58 PM   TSH 4.24 05/20/2018 10:19 AM    CBC Latest Ref Rng & Units 03/05/2020 02/23/2017 09/01/2014  WBC 3.8 - 10.8 Thousand/uL 7.2 5.2 5.1  Hemoglobin 11.7 - 15.5 g/dL 13.1 15.1 12.7  Hematocrit 35.0 - 45.0 % 39.8 45.1(H) -  Platelets 140 - 400 Thousand/uL 158 200 -    Lab Results  Component Value Date/Time   VD25OH 38 04/22/2021 03:35 PM    Social History   Tobacco Use  Smoking Status Never  Smokeless Tobacco Never   BP Readings from Last 3 Encounters:  08/04/21 110/63  04/21/21 120/60  03/06/21 108/65   Pulse Readings from Last 3 Encounters:  08/04/21 85  04/21/21 (!) 113  03/06/21 98   Wt Readings from Last 3 Encounters:  04/21/21 145 lb (65.8 kg)  03/06/21 145 lb (65.8 kg)  08/08/20 152 lb (68.9 kg)    Assessment: Review of patient past medical history, allergies, medications,  health status, including review of consultants reports, laboratory and other test data, was performed as part of comprehensive evaluation and provision of chronic care management services.   SDOH:  (Social Determinants of Health) assessments and interventions performed:    CCM Care Plan  Allergies  Allergen Reactions   Latex Itching and Rash    Medications Reviewed Today     Reviewed by Francis Gaines, LCSW (Social Worker) on 08/14/21 at 1212  Med List Status: <None>   Medication Order Taking? Sig Documenting Provider Last Dose Status Informant  calcium  carbonate (OS-CAL - DOSED IN MG OF ELEMENTAL CALCIUM) 1250 (500 Ca) MG tablet [id number] No Take 1 tablet by mouth daily. [provider] Taking Active   esomeprazole (NEXIUM) 20 MG capsule [id number] No Take 10 mg by mouth daily before breakfast. Take 10 mg 30 minutes before breakfast [provider] Taking Active            Med Note Juleen China, Deno Etienne   Wed Jun 11, 2021  1:32 PM) Reports takes one 20 mg capsule every morning  gabapentin (NEURONTIN) 300 MG capsule [id number]  Take 300 mg by mouth in the morning, at noon, and at bedtime. [provider]  Active   magnesium oxide (MAG-OX) 400 MG tablet [id number] No Take by mouth. [provider] Taking Active   nitrofurantoin (MACRODANTIN) 50 MG capsule [id number] No Take 1 capsule (50 mg total) by mouth at bedtime. For prophylaxis for bladder infection Hali Marry, MD Taking Active   ondansetron [facility]) 8 MG tablet [id number] No Take by mouth. [provider] Taking Active            Med Note Juleen China, Deno Etienne   Wed Jun 11, 2021  1:34 PM) Reports takes prn  oxybutynin (DITROPAN) 5 MG tablet [id number] No Take 1 tablet by mouth twice daily Hali Marry, MD Taking Active   PARoxetine (PAXIL) 20 MG tablet [id number] No Take 1 tablet (20 mg total) by mouth daily. Hali Marry, MD Taking Active   potassium chloride (KLOR-CON) 10 MEQ tablet [id number] No Take by mouth. [place], DO Taking Active            Med Note Juleen China, Deno Etienne   Wed Jun 11, 2021  1:35 PM) Reports takes 1 tablet every day  predniSONE (DELTASONE) 20 MG tablet [id number]  Take 20 mg by mouth every morning. [provider]  Active   senna-docusate (SENOKOT-S) 8.6-50 MG tablet [id number] No Take 1 tablet by mouth daily. 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   Left leg weakness 06/02/2021   Recurrent UTI 11/27/2020   Neutropenia associated with infection ([place]) 11/23/2020   Mixed stress and urge urinary incontinence 10/22/2020   Generalized weakness 10/22/2020   Pancytopenia due to antineoplastic chemotherapy ([place]) 10/13/2020   Cerebellar ataxia in diseases classified elsewhere ([place]) 08/08/2020   Retroperitoneal lymphadenopathy 08/08/2020   Peripheral neuropathic pain 07/22/2020   GERD (gastroesophageal reflux disease) 07/03/2020   Stage 3b chronic kidney disease ([place]) 07/03/2020   Bilateral ureteral obstruction 05/27/2020   Hydronephrosis 05/18/2019   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  [place]) 05/03/2019   Endometrial adenocarcinoma ([place]) 03/03/2019   Closed compression fracture of body of L1 vertebra ([place]) 02/24/2019   Age-related osteoporosis with current pathological fracture with routine healing 02/24/2019   Fall 02/23/2019   Moderate to severe pulmonary hypertension ([place]) 02/21/2019   Pelvic mass 02/20/2019   Hiatal hernia 02/20/2019   Fibroid uterus 02/20/2019   Bladder wall thickening 02/20/2019   Aortic atherosclerosis ([place]) 02/20/2019   Obesity (BMI 30-39.9) 10/05/2014   ARM PAIN, LEFT 10/13/2010   INSOMNIA 05/13/2009   DIZZINESS 09/13/2007   Hyperlipidemia 08/19/2007   Depression, recurrent ([place]) 09/28/2006   HYPERTENSION, BENIGN SYSTEMIC 09/28/2006   MENOPAUSAL SYNDROME 09/28/2006   VASOVAGAL SYNCOPE 09/28/2006   FATIGUE/MALAISE 09/28/2006    Immunization History  Administered Date(s) Administered   Fluad Quad(high Dose 65+) 09/11/2019   Influenza, High Dose Seasonal PF 03/09/2019   Influenza,inj,Quad PF,6+ Mos 10/16/2020   PNEUMOCOCCAL CONJUGATE-20 08/04/2021   Pneumococcal Polysaccharide-23 03/09/2019    Conditions to be addressed/monitored: HTN, Depression, and Chronic Pain  There are no care plans  that you recently modified to display for this patient.   Medication Assistance: None required.  Patient affirms current coverage meets needs.  Patient's preferred pharmacy is:  [place], [place] [facility] [place] [address] [place] [address] Phone: [phone] Fax: [phone]  Uses pill box? No - daughter helps with medicines Pt endorses 100% compliance  Follow Up:  Patient agrees to Care Plan and Follow-up.  Plan: Telephone follow up appointment with care management team member scheduled for:  1 month  Darius Bump

## 2021-08-20 DIAGNOSIS — S24102A Unspecified injury at T2-T6 level of thoracic spinal cord, initial encounter: Secondary | ICD-10-CM | POA: Diagnosis not present

## 2021-08-20 DIAGNOSIS — E785 Hyperlipidemia, unspecified: Secondary | ICD-10-CM | POA: Diagnosis not present

## 2021-08-20 DIAGNOSIS — S064X9A Epidural hemorrhage with loss of consciousness of unspecified duration, initial encounter: Secondary | ICD-10-CM | POA: Diagnosis not present

## 2021-08-20 DIAGNOSIS — N390 Urinary tract infection, site not specified: Secondary | ICD-10-CM | POA: Diagnosis not present

## 2021-08-20 DIAGNOSIS — G9761 Postprocedural hematoma of a nervous system organ or structure following a nervous system procedure: Secondary | ICD-10-CM | POA: Diagnosis not present

## 2021-08-20 DIAGNOSIS — I10 Essential (primary) hypertension: Secondary | ICD-10-CM | POA: Diagnosis not present

## 2021-08-20 DIAGNOSIS — D62 Acute posthemorrhagic anemia: Secondary | ICD-10-CM | POA: Diagnosis not present

## 2021-08-20 DIAGNOSIS — G9589 Other specified diseases of spinal cord: Secondary | ICD-10-CM | POA: Diagnosis not present

## 2021-08-20 DIAGNOSIS — I129 Hypertensive chronic kidney disease with stage 1 through stage 4 chronic kidney disease, or unspecified chronic kidney disease: Secondary | ICD-10-CM | POA: Diagnosis not present

## 2021-08-20 DIAGNOSIS — D497 Neoplasm of unspecified behavior of endocrine glands and other parts of nervous system: Secondary | ICD-10-CM | POA: Diagnosis not present

## 2021-08-20 DIAGNOSIS — F339 Major depressive disorder, recurrent, unspecified: Secondary | ICD-10-CM

## 2021-08-20 DIAGNOSIS — N1831 Chronic kidney disease, stage 3a: Secondary | ICD-10-CM | POA: Diagnosis not present

## 2021-08-20 DIAGNOSIS — D492 Neoplasm of unspecified behavior of bone, soft tissue, and skin: Secondary | ICD-10-CM | POA: Diagnosis not present

## 2021-08-20 DIAGNOSIS — K219 Gastro-esophageal reflux disease without esophagitis: Secondary | ICD-10-CM | POA: Diagnosis not present

## 2021-08-20 DIAGNOSIS — G9529 Other cord compression: Secondary | ICD-10-CM | POA: Diagnosis not present

## 2021-08-20 DIAGNOSIS — I272 Pulmonary hypertension, unspecified: Secondary | ICD-10-CM | POA: Diagnosis not present

## 2021-08-20 DIAGNOSIS — C541 Malignant neoplasm of endometrium: Secondary | ICD-10-CM | POA: Diagnosis not present

## 2021-08-20 DIAGNOSIS — S064X0A Epidural hemorrhage without loss of consciousness, initial encounter: Secondary | ICD-10-CM | POA: Diagnosis not present

## 2021-08-20 DIAGNOSIS — G9520 Unspecified cord compression: Secondary | ICD-10-CM | POA: Diagnosis not present

## 2021-08-20 DIAGNOSIS — D321 Benign neoplasm of spinal meninges: Secondary | ICD-10-CM | POA: Diagnosis not present

## 2021-08-20 DIAGNOSIS — D61810 Antineoplastic chemotherapy induced pancytopenia: Secondary | ICD-10-CM | POA: Diagnosis not present

## 2021-08-20 DIAGNOSIS — K449 Diaphragmatic hernia without obstruction or gangrene: Secondary | ICD-10-CM | POA: Diagnosis not present

## 2021-08-20 DIAGNOSIS — F411 Generalized anxiety disorder: Secondary | ICD-10-CM | POA: Diagnosis not present

## 2021-08-20 DIAGNOSIS — G8220 Paraplegia, unspecified: Secondary | ICD-10-CM | POA: Diagnosis not present

## 2021-08-20 NOTE — Patient Instructions (Signed)
Visit Information  PATIENT GOALS:  Goals Addressed             This Visit's Progress    Medication Management       Patient Goals/Self-Care Activities Over the next 30 days, patient will:  take medications as prescribed  Follow Up Plan: Telephone follow up appointment with care management team member scheduled for:  1 month         Patient verbalizes understanding of instructions provided today and agrees to view in [place].   Telephone follow up appointment with care management team member scheduled for: 1 month

## 2021-08-22 ENCOUNTER — Ambulatory Visit (INDEPENDENT_AMBULATORY_CARE_PROVIDER_SITE_OTHER): Payer: Medicare HMO | Admitting: *Deleted

## 2021-08-22 DIAGNOSIS — G9332 Myalgic encephalomyelitis/chronic fatigue syndrome: Secondary | ICD-10-CM

## 2021-08-22 DIAGNOSIS — F339 Major depressive disorder, recurrent, unspecified: Secondary | ICD-10-CM

## 2021-08-22 DIAGNOSIS — D497 Neoplasm of unspecified behavior of endocrine glands and other parts of nervous system: Secondary | ICD-10-CM

## 2021-08-22 DIAGNOSIS — R42 Dizziness and giddiness: Secondary | ICD-10-CM

## 2021-08-22 DIAGNOSIS — R5382 Chronic fatigue, unspecified: Secondary | ICD-10-CM

## 2021-08-22 DIAGNOSIS — R531 Weakness: Secondary | ICD-10-CM

## 2021-08-22 DIAGNOSIS — N135 Crossing vessel and stricture of ureter without hydronephrosis: Secondary | ICD-10-CM

## 2021-08-22 DIAGNOSIS — D62 Acute posthemorrhagic anemia: Secondary | ICD-10-CM | POA: Diagnosis not present

## 2021-08-22 DIAGNOSIS — F411 Generalized anxiety disorder: Secondary | ICD-10-CM | POA: Diagnosis not present

## 2021-08-22 DIAGNOSIS — N390 Urinary tract infection, site not specified: Secondary | ICD-10-CM | POA: Diagnosis not present

## 2021-08-22 DIAGNOSIS — S32010A Wedge compression fracture of first lumbar vertebra, initial encounter for closed fracture: Secondary | ICD-10-CM

## 2021-08-22 DIAGNOSIS — I272 Pulmonary hypertension, unspecified: Secondary | ICD-10-CM

## 2021-08-22 DIAGNOSIS — M792 Neuralgia and neuritis, unspecified: Secondary | ICD-10-CM

## 2021-08-22 DIAGNOSIS — N1832 Chronic kidney disease, stage 3b: Secondary | ICD-10-CM

## 2021-08-22 DIAGNOSIS — S064X9A Epidural hemorrhage with loss of consciousness of unspecified duration, initial encounter: Secondary | ICD-10-CM | POA: Diagnosis not present

## 2021-08-22 DIAGNOSIS — K219 Gastro-esophageal reflux disease without esophagitis: Secondary | ICD-10-CM | POA: Diagnosis not present

## 2021-08-22 DIAGNOSIS — E669 Obesity, unspecified: Secondary | ICD-10-CM

## 2021-08-22 DIAGNOSIS — D492 Neoplasm of unspecified behavior of bone, soft tissue, and skin: Secondary | ICD-10-CM | POA: Diagnosis not present

## 2021-08-22 DIAGNOSIS — M8000XD Age-related osteoporosis with current pathological fracture, unspecified site, subsequent encounter for fracture with routine healing: Secondary | ICD-10-CM

## 2021-08-22 NOTE — Patient Instructions (Signed)
Visit Information  PATIENT GOALS:  Goals Addressed               This Visit's Progress     Mobility and Independence Optimized. (pt-stated)   On track     Timeframe:  Short-Term Goal Priority:  High Start Date:   06/13/2021                         Expected End Date:   09/12/2021  Follow-Up Date:  09/01/2021 at 10:00am  Patient Goals/Self-Care Activities: Continue recovery from Thoracic Laminectomy of T - 1, 2, 3, 4 and Resection of Thoracic Tumor Lateral Fusion of T - 1 thru T - 4 at [place] will work with [facility] Social Worker at [facility] to assist with skilled nursing facility placement for short-term rehabilitative services. LCSW will contact [facility] to check bed availability for short-term rehabilitative services. Review list of skilled nursing facilities in [place] and [facility] and be prepared to provide LCSW with at least 3 additional facilities of interest, during our next scheduled follow-up outreach call. Contact LCSW directly (# [phone]) if you have questions, need assistance, or if additional social work needs are identified between now and our next scheduled follow-up outreach call.        Patient verbalizes understanding of instructions provided today and agrees to view in [place].   Telephone follow-up appointment with care management team member scheduled for:  09/01/2021 at 10:00am  [place] Licensed Clinical Social Worker [place] [phone]

## 2021-08-22 NOTE — Chronic Care Management (AMB) (Signed)
Chronic Care Management    Clinical Social Work Note  08/22/2021 Name: Amy Wilson MRN: [id number] DOB: 09-14-1951  Amy Wilson is a 70 y.o. year old female who is a primary care patient of Metheney, Rene Kocher, MD. The CCM team was consulted to assist the patient with chronic disease management and/or care coordination needs related to: Level of Care Concerns.   Engaged with patient by telephone for follow-up visit in response to provider referral for social work chronic care management and care coordination services.   Consent to Services:  The patient was given information about Chronic Care Management services, agreed to services, and gave verbal consent prior to initiation of services.  Please see initial visit note for detailed documentation.   Patient agreed to services and consent obtained.   Assessment: Review of patient past medical history, allergies, medications, and health status, including review of relevant consultants reports was performed today as part of a comprehensive evaluation and provision of chronic care management and care coordination services.     SDOH (Social Determinants of Health) assessments and interventions performed:    Advanced Directives Status: Not addressed in this encounter.  CCM Care Plan  Allergies  Allergen Reactions   Latex Itching and Rash    Outpatient Encounter Medications as of 08/22/2021  Medication Sig Note   calcium carbonate (OS-CAL - DOSED IN MG OF ELEMENTAL CALCIUM) 1250 (500 Ca) MG tablet Take 1 tablet by mouth daily. (Patient not taking: Reported on 08/18/2021)    esomeprazole (NEXIUM) 20 MG capsule Take 10 mg by mouth daily before breakfast. Take 10 mg 30 minutes before breakfast 06/11/2021: Reports takes one 20 mg capsule every morning   gabapentin (NEURONTIN) 300 MG capsule Take 300 mg by mouth in the morning, at noon, and at bedtime. 08/18/2021: Takes 7 times daily, managed by neurologist.   magnesium oxide (MAG-OX) 400 MG  tablet Take by mouth.    nitrofurantoin (MACRODANTIN) 50 MG capsule Take 1 capsule (50 mg total) by mouth at bedtime. For prophylaxis for bladder infection    ondansetron (ZOFRAN) 8 MG tablet Take by mouth. 06/11/2021: Reports takes prn   oxybutynin (DITROPAN) 5 MG tablet Take 1 tablet by mouth twice daily    PARoxetine (PAXIL) 20 MG tablet Take 1 tablet (20 mg total) by mouth daily.    potassium chloride (KLOR-CON) 10 MEQ tablet Take by mouth. (Patient not taking: Reported on 08/18/2021)    predniSONE (DELTASONE) 20 MG tablet Take 20 mg by mouth every morning. (Patient not taking: Reported on 08/18/2021)    senna-docusate (SENOKOT-S) 8.6-50 MG tablet Take 1 tablet by mouth daily. 06/11/2021: Reports takes as needed   No facility-administered encounter medications on file as of 08/22/2021.    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   Left leg weakness 06/02/2021   Recurrent UTI 11/27/2020   Neutropenia associated with infection ([place]) 11/23/2020   Mixed stress and urge urinary incontinence 10/22/2020   Generalized weakness 10/22/2020   Pancytopenia due to antineoplastic chemotherapy ([place]) 10/13/2020   Cerebellar ataxia in diseases classified elsewhere ([place]) 08/08/2020   Retroperitoneal lymphadenopathy 08/08/2020   Peripheral neuropathic pain 07/22/2020   GERD (gastroesophageal reflux disease) 07/03/2020   Stage 3b chronic kidney disease ([place]) 07/03/2020   Bilateral ureteral obstruction 05/27/2020   Hydronephrosis 05/18/2019   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[place]) 05/03/2019   Endometrial adenocarcinoma ([place]) 03/03/2019   Closed  compression fracture of body of L1 vertebra ([place]) 02/24/2019   Age-related osteoporosis with current pathological fracture with routine healing 02/24/2019   Fall 02/23/2019   Moderate to severe pulmonary hypertension ([place]) 02/21/2019   Pelvic mass  02/20/2019   Hiatal hernia 02/20/2019   Fibroid uterus 02/20/2019   Bladder wall thickening 02/20/2019   Aortic atherosclerosis ([place]) 02/20/2019   Obesity (BMI 30-39.9) 10/05/2014   ARM PAIN, LEFT 10/13/2010   INSOMNIA 05/13/2009   DIZZINESS 09/13/2007   Hyperlipidemia 08/19/2007   Depression, recurrent ([place]) 09/28/2006   HYPERTENSION, BENIGN SYSTEMIC 09/28/2006   MENOPAUSAL SYNDROME 09/28/2006   VASOVAGAL SYNCOPE 09/28/2006   FATIGUE/MALAISE 09/28/2006    Conditions to be addressed/monitored:  Thoracic Laminectomy of T - 1, 2, 3, 4 and Resection of Thoracic Tumor Lateral Fusion of T - 1 thru T - 4.  Level of Care Concerns, ADL/IADL Limitations and Limited Access to Caregiver.  Care Plan : LCSW Plan of Care  Updates made by Francis Gaines, LCSW since 08/22/2021 12:00 AM     Problem: Mobility and Independence.   Priority: High     Goal: Mobility and Independence Optimized.   Start Date: 06/13/2021  Expected End Date: 09/12/2021  This Visit's Progress: On track  Recent Progress: On track  Priority: High  Note:   Current Barriers:  Chronic disease management support and education needs related to Mobility Issues, Fall Risk, Endometrial Adenocarcinoma, Depression, Hypertension, Left Leg Weakness, Fatigue, Stage III Chronic Kidney Disease, Osteoporosis, Obesity, Generalized Weakness and Dizziness. 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   Prior interventions have been Ineffective (I.e home health physical and occupational therapy and aquatic therapy) Clinical Goal(s):  Patient will work with LCSW to identify and address any acute and chronic care coordination needs related to the self-health management of Depression, Endometrial Adenocarcinoma, Fatigue, Left Leg Weakness, Dizziness and Osteoporosis.    LCSW Interventions:  Inter-disciplinary care team collaboration (see longitudinal plan of care). Collaboration with Primary  Care Physician, Dr. [name] regarding development and update of comprehensive plan of care as evidenced by provider attestation and co-signature. Performed thorough chart review. Collaboration with Inpatient Hospital Social Worker at [facility] to assist with short-term skilled nursing facility placement for rehabilitative services. Collaboration with Admissions Director at [facility] and [place] to coordinate short-term skilled nursing facility placement for rehabilitative services. Patient Goals/Self-Care Activities: Continue recovery from Thoracic Laminectomy of T - 1, 2, 3, 4 and Resection of Thoracic Tumor Lateral Fusion of T - 1 thru T - 4 at [place] will work with [facility] Social Worker at [facility] to assist with skilled nursing facility placement for short-term rehabilitative services. LCSW will contact [facility] to check bed availability for short-term rehabilitative services. Review list of skilled nursing facilities in [place] and [facility] and be prepared to provide LCSW with at least 3 additional facilities of interest, during our next scheduled follow-up outreach call. Contact LCSW directly (# [phone]) if you have questions, need assistance, or if additional social work needs are identified between now and our next scheduled follow-up outreach call. Follow-Up Plan:  LCSW will make contact with patient on 09/01/2021 at 10:00am      Follow-Up Plan:  09/01/2021 at 10:00am      [place] Worker [place] [phone]

## 2021-08-23 DIAGNOSIS — F411 Generalized anxiety disorder: Secondary | ICD-10-CM | POA: Diagnosis not present

## 2021-08-23 DIAGNOSIS — N390 Urinary tract infection, site not specified: Secondary | ICD-10-CM | POA: Diagnosis not present

## 2021-08-23 DIAGNOSIS — D492 Neoplasm of unspecified behavior of bone, soft tissue, and skin: Secondary | ICD-10-CM | POA: Diagnosis not present

## 2021-08-23 DIAGNOSIS — D62 Acute posthemorrhagic anemia: Secondary | ICD-10-CM | POA: Diagnosis not present

## 2021-08-24 DIAGNOSIS — D62 Acute posthemorrhagic anemia: Secondary | ICD-10-CM | POA: Diagnosis not present

## 2021-08-24 DIAGNOSIS — N390 Urinary tract infection, site not specified: Secondary | ICD-10-CM | POA: Diagnosis not present

## 2021-08-24 DIAGNOSIS — F411 Generalized anxiety disorder: Secondary | ICD-10-CM | POA: Diagnosis not present

## 2021-08-24 DIAGNOSIS — D492 Neoplasm of unspecified behavior of bone, soft tissue, and skin: Secondary | ICD-10-CM | POA: Diagnosis not present

## 2021-08-26 DIAGNOSIS — K219 Gastro-esophageal reflux disease without esophagitis: Secondary | ICD-10-CM | POA: Diagnosis not present

## 2021-08-26 DIAGNOSIS — F411 Generalized anxiety disorder: Secondary | ICD-10-CM | POA: Diagnosis not present

## 2021-08-26 DIAGNOSIS — D62 Acute posthemorrhagic anemia: Secondary | ICD-10-CM | POA: Diagnosis not present

## 2021-08-26 DIAGNOSIS — D492 Neoplasm of unspecified behavior of bone, soft tissue, and skin: Secondary | ICD-10-CM | POA: Diagnosis not present

## 2021-08-27 ENCOUNTER — Telehealth: Payer: Medicare HMO

## 2021-08-28 DIAGNOSIS — F411 Generalized anxiety disorder: Secondary | ICD-10-CM | POA: Diagnosis not present

## 2021-08-28 DIAGNOSIS — K219 Gastro-esophageal reflux disease without esophagitis: Secondary | ICD-10-CM | POA: Diagnosis not present

## 2021-08-28 DIAGNOSIS — D62 Acute posthemorrhagic anemia: Secondary | ICD-10-CM | POA: Diagnosis not present

## 2021-08-28 DIAGNOSIS — D492 Neoplasm of unspecified behavior of bone, soft tissue, and skin: Secondary | ICD-10-CM | POA: Diagnosis not present

## 2021-08-31 DIAGNOSIS — F411 Generalized anxiety disorder: Secondary | ICD-10-CM | POA: Diagnosis not present

## 2021-08-31 DIAGNOSIS — K219 Gastro-esophageal reflux disease without esophagitis: Secondary | ICD-10-CM | POA: Diagnosis not present

## 2021-08-31 DIAGNOSIS — D62 Acute posthemorrhagic anemia: Secondary | ICD-10-CM | POA: Diagnosis not present

## 2021-08-31 DIAGNOSIS — D492 Neoplasm of unspecified behavior of bone, soft tissue, and skin: Secondary | ICD-10-CM | POA: Diagnosis not present

## 2021-09-01 ENCOUNTER — Ambulatory Visit: Payer: Medicare HMO | Admitting: *Deleted

## 2021-09-01 DIAGNOSIS — M21372 Foot drop, left foot: Secondary | ICD-10-CM | POA: Diagnosis not present

## 2021-09-01 DIAGNOSIS — E669 Obesity, unspecified: Secondary | ICD-10-CM

## 2021-09-01 DIAGNOSIS — R531 Weakness: Secondary | ICD-10-CM | POA: Diagnosis not present

## 2021-09-01 DIAGNOSIS — R2689 Other abnormalities of gait and mobility: Secondary | ICD-10-CM | POA: Diagnosis not present

## 2021-09-01 DIAGNOSIS — D497 Neoplasm of unspecified behavior of endocrine glands and other parts of nervous system: Secondary | ICD-10-CM

## 2021-09-01 DIAGNOSIS — R5382 Chronic fatigue, unspecified: Secondary | ICD-10-CM

## 2021-09-01 DIAGNOSIS — G9332 Myalgic encephalomyelitis/chronic fatigue syndrome: Secondary | ICD-10-CM

## 2021-09-01 DIAGNOSIS — G8918 Other acute postprocedural pain: Secondary | ICD-10-CM | POA: Diagnosis not present

## 2021-09-01 DIAGNOSIS — M532X4 Spinal instabilities, thoracic region: Secondary | ICD-10-CM | POA: Diagnosis not present

## 2021-09-01 DIAGNOSIS — Z981 Arthrodesis status: Secondary | ICD-10-CM | POA: Diagnosis not present

## 2021-09-01 DIAGNOSIS — L89312 Pressure ulcer of right buttock, stage 2: Secondary | ICD-10-CM | POA: Diagnosis not present

## 2021-09-01 DIAGNOSIS — Z743 Need for continuous supervision: Secondary | ICD-10-CM | POA: Diagnosis not present

## 2021-09-01 DIAGNOSIS — F339 Major depressive disorder, recurrent, unspecified: Secondary | ICD-10-CM | POA: Diagnosis not present

## 2021-09-01 DIAGNOSIS — R42 Dizziness and giddiness: Secondary | ICD-10-CM

## 2021-09-01 DIAGNOSIS — N1832 Chronic kidney disease, stage 3b: Secondary | ICD-10-CM

## 2021-09-01 DIAGNOSIS — I700 Atherosclerosis of aorta: Secondary | ICD-10-CM

## 2021-09-01 DIAGNOSIS — R590 Localized enlarged lymph nodes: Secondary | ICD-10-CM

## 2021-09-01 DIAGNOSIS — M792 Neuralgia and neuritis, unspecified: Secondary | ICD-10-CM

## 2021-09-01 DIAGNOSIS — G3281 Cerebellar ataxia in diseases classified elsewhere: Secondary | ICD-10-CM

## 2021-09-01 DIAGNOSIS — I272 Pulmonary hypertension, unspecified: Secondary | ICD-10-CM

## 2021-09-01 DIAGNOSIS — D62 Acute posthemorrhagic anemia: Secondary | ICD-10-CM | POA: Diagnosis not present

## 2021-09-01 DIAGNOSIS — G9529 Other cord compression: Secondary | ICD-10-CM | POA: Diagnosis not present

## 2021-09-01 DIAGNOSIS — I621 Nontraumatic extradural hemorrhage: Secondary | ICD-10-CM | POA: Diagnosis not present

## 2021-09-01 DIAGNOSIS — N1330 Unspecified hydronephrosis: Secondary | ICD-10-CM | POA: Diagnosis not present

## 2021-09-01 DIAGNOSIS — M8000XD Age-related osteoporosis with current pathological fracture, unspecified site, subsequent encounter for fracture with routine healing: Secondary | ICD-10-CM

## 2021-09-01 DIAGNOSIS — R32 Unspecified urinary incontinence: Secondary | ICD-10-CM | POA: Diagnosis not present

## 2021-09-01 DIAGNOSIS — E785 Hyperlipidemia, unspecified: Secondary | ICD-10-CM | POA: Diagnosis not present

## 2021-09-01 DIAGNOSIS — S32010A Wedge compression fracture of first lumbar vertebra, initial encounter for closed fracture: Secondary | ICD-10-CM

## 2021-09-01 DIAGNOSIS — I10 Essential (primary) hypertension: Secondary | ICD-10-CM | POA: Diagnosis not present

## 2021-09-01 DIAGNOSIS — M21371 Foot drop, right foot: Secondary | ICD-10-CM | POA: Diagnosis not present

## 2021-09-01 DIAGNOSIS — K219 Gastro-esophageal reflux disease without esophagitis: Secondary | ICD-10-CM | POA: Diagnosis not present

## 2021-09-01 DIAGNOSIS — Z483 Aftercare following surgery for neoplasm: Secondary | ICD-10-CM | POA: Diagnosis not present

## 2021-09-01 DIAGNOSIS — S064X9A Epidural hemorrhage with loss of consciousness of unspecified duration, initial encounter: Secondary | ICD-10-CM | POA: Diagnosis not present

## 2021-09-01 DIAGNOSIS — Z7409 Other reduced mobility: Secondary | ICD-10-CM | POA: Diagnosis not present

## 2021-09-01 DIAGNOSIS — L89152 Pressure ulcer of sacral region, stage 2: Secondary | ICD-10-CM | POA: Diagnosis not present

## 2021-09-01 DIAGNOSIS — N189 Chronic kidney disease, unspecified: Secondary | ICD-10-CM | POA: Diagnosis not present

## 2021-09-01 DIAGNOSIS — G9761 Postprocedural hematoma of a nervous system organ or structure following a nervous system procedure: Secondary | ICD-10-CM | POA: Diagnosis not present

## 2021-09-01 DIAGNOSIS — S32000A Wedge compression fracture of unspecified lumbar vertebra, initial encounter for closed fracture: Secondary | ICD-10-CM | POA: Diagnosis not present

## 2021-09-01 NOTE — Patient Instructions (Signed)
Visit Information  PATIENT GOALS:  Goals Addressed               This Visit's Progress     Mobility and Independence Optimized. (pt-stated)   On track     Timeframe:  Short-Term Goal Priority:  High Start Date:   06/13/2021                         Expected End Date:   09/12/2021  Follow-Up Date:  09/05/2021 at 10:30am  Patient Goals/Self-Care Activities: Continue recovery from Thoracic Laminectomy of T - 1, 2, 3, 4 and Resection of Thoracic Tumor Lateral Fusion of T - 1 thru T - 4 at [facility], until medically stable for transition to [place] with Discharge Planning Case Manager at [place] [phone]), to learn that you will be evaluated by inpatient physical and occupational therapists today to obtain prior authorization for Inpatient Acute Rehabilitation Services through [facility].   Contact LCSW directly (# [phone]) if you have questions, need assistance, or if additional social work needs are identified between now and our next scheduled follow-up outreach call.        Patient verbalizes understanding of instructions provided today and agrees to view in [place].   Telephone follow-up appointment with care management team member scheduled for:  09/05/2021 at 10:30am  [place] Licensed Clinical Social Worker [place] [phone]

## 2021-09-01 NOTE — Chronic Care Management (AMB) (Signed)
Chronic Care Management    Clinical Social Work Note  09/01/2021 Name: Amy Wilson MRN: [id number] DOB: [birth year]  Amy Wilson is a 70 y.o. year old female who is a primary care patient of Metheney, Amy Kocher, MD. The CCM team was consulted to assist the patient with chronic disease management and/or care coordination needs related to: Level of Care Concerns.   Engaged with patient's son by telephone for follow-up visit in response to provider referral for social work chronic care management and care coordination services.   Consent to Services:  The patient was given information about Chronic Care Management services, agreed to services, and gave verbal consent prior to initiation of services.  Please see initial visit note for detailed documentation.   Patient agreed to services and consent obtained.   Assessment: Review of patient past medical history, allergies, medications, and health status, including review of relevant consultants reports was performed today as part of a comprehensive evaluation and provision of chronic care management and care coordination services.     SDOH (Social Determinants of Health) assessments and interventions performed:    Advanced Directives Status: Not addressed in this encounter.  CCM Care Plan  Allergies  Allergen Reactions   Latex Itching and Rash    Outpatient Encounter Medications as of 09/01/2021  Medication Sig Note   calcium carbonate (OS-CAL - DOSED IN MG OF ELEMENTAL CALCIUM) 1250 (500 Ca) MG tablet Take 1 tablet by mouth daily. (Patient not taking: Reported on 08/18/2021)    esomeprazole (NEXIUM) 20 MG capsule Take 10 mg by mouth daily before breakfast. Take 10 mg 30 minutes before breakfast 06/11/2021: Reports takes one 20 mg capsule every morning   gabapentin (NEURONTIN) 300 MG capsule Take 300 mg by mouth in the morning, at noon, and at bedtime. 08/18/2021: Takes 7 times daily, managed by neurologist.   magnesium oxide (MAG-OX)  400 MG tablet Take by mouth.    nitrofurantoin (MACRODANTIN) 50 MG capsule Take 1 capsule (50 mg total) by mouth at bedtime. For prophylaxis for bladder infection    ondansetron (ZOFRAN) 8 MG tablet Take by mouth. 06/11/2021: Reports takes prn   oxybutynin (DITROPAN) 5 MG tablet Take 1 tablet by mouth twice daily    PARoxetine (PAXIL) 20 MG tablet Take 1 tablet (20 mg total) by mouth daily.    potassium chloride (KLOR-CON) 10 MEQ tablet Take by mouth. (Patient not taking: Reported on 08/18/2021)    predniSONE (DELTASONE) 20 MG tablet Take 20 mg by mouth every morning. (Patient not taking: Reported on 08/18/2021)    senna-docusate (SENOKOT-S) 8.6-50 MG tablet Take 1 tablet by mouth daily. 06/11/2021: Reports takes as needed   No facility-administered encounter medications on file as of 09/01/2021.    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   Left leg weakness 06/02/2021   Recurrent UTI 11/27/2020   Neutropenia associated with infection ([place]) 11/23/2020   Mixed stress and urge urinary incontinence 10/22/2020   Generalized weakness 10/22/2020   Pancytopenia due to antineoplastic chemotherapy ([place]) 10/13/2020   Cerebellar ataxia in diseases classified elsewhere ([place]) 08/08/2020   Retroperitoneal lymphadenopathy 08/08/2020   Peripheral neuropathic pain 07/22/2020   GERD (gastroesophageal reflux disease) 07/03/2020   Stage 3b chronic kidney disease ([place]) 07/03/2020   Bilateral ureteral obstruction 05/27/2020   Hydronephrosis 05/18/2019   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[place]) 05/03/2019   Endometrial adenocarcinoma ([place]) 03/03/2019  Closed compression fracture of body of L1 vertebra ([place]) 02/24/2019   Age-related osteoporosis with current pathological fracture with routine healing 02/24/2019   Fall 02/23/2019   Moderate to severe pulmonary hypertension ([place]) 02/21/2019   Pelvic  mass 02/20/2019   Hiatal hernia 02/20/2019   Fibroid uterus 02/20/2019   Bladder wall thickening 02/20/2019   Aortic atherosclerosis ([place]) 02/20/2019   Obesity (BMI 30-39.9) 10/05/2014   ARM PAIN, LEFT 10/13/2010   INSOMNIA 05/13/2009   DIZZINESS 09/13/2007   Hyperlipidemia 08/19/2007   Depression, recurrent ([place]) 09/28/2006   HYPERTENSION, BENIGN SYSTEMIC 09/28/2006   MENOPAUSAL SYNDROME 09/28/2006   VASOVAGAL SYNCOPE 09/28/2006   FATIGUE/MALAISE 09/28/2006    Conditions to be addressed/monitored: HTN and Thoracic Surgery.  Level of Care Concerns, ADL/IADL Limitations, Limited Access to Caregiver, and Memory Deficits.  Care Plan : LCSW Plan of Care  Updates made by Francis Gaines, LCSW since 09/01/2021 12:00 AM     Problem: Mobility and Independence.   Priority: High     Goal: Mobility and Independence Optimized.   Start Date: 06/13/2021  Expected End Date: 09/12/2021  This Visit's Progress: On track  Recent Progress: On track  Priority: High  Note:   Current Barriers:  Chronic disease management support and education needs related to Mobility Issues, Fall Risk, Endometrial Adenocarcinoma, Depression, Hypertension, Left Leg Weakness, Fatigue, Stage III Chronic Kidney Disease, Osteoporosis, Obesity, Generalized Weakness and Dizziness. 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   Prior interventions have been Ineffective (I.e home health physical and occupational therapy and aquatic therapy) Clinical Goal(s):  Patient will work with LCSW to identify and address any acute and chronic care coordination needs related to the self-health management of Depression, Endometrial Adenocarcinoma, Fatigue, Left Leg Weakness, Dizziness and Osteoporosis.    LCSW Interventions:  Inter-disciplinary care team collaboration (see longitudinal plan of care). Collaboration with Primary Care Physician, Dr. [name] regarding  development and update of comprehensive plan of care as evidenced by provider attestation and co-signature. Performed thorough chart review. Collaboration with Discharge Planning Case Manager at [place], to obtain an update on patient's status to report back to patient's son.   Patient Goals/Self-Care Activities: Continue recovery from Thoracic Laminectomy of T - 1, 2, 3, 4 and Resection of Thoracic Tumor Lateral Fusion of T - 1 thru T - 4 at [facility], until medically stable for transition to [place] with Discharge Planning Case Manager at [place] [phone]), to learn that you will be evaluated by inpatient physical and occupational therapists today to obtain prior authorization for Inpatient Acute Rehabilitation Services through [facility].   Contact LCSW directly (# [phone]) if you have questions, need assistance, or if additional social work needs are identified between now and our next scheduled follow-up outreach call. Follow-Up Plan:  LCSW will make contact with patient on 09/05/2021 at 10:30am      Follow-Up Plan:  09/05/2021 at 10:30am      [place] Clinical Social Worker [place] [phone]

## 2021-09-03 ENCOUNTER — Telehealth: Payer: Medicare HMO

## 2021-09-05 ENCOUNTER — Ambulatory Visit: Payer: Medicare HMO | Admitting: *Deleted

## 2021-09-05 DIAGNOSIS — R42 Dizziness and giddiness: Secondary | ICD-10-CM

## 2021-09-05 DIAGNOSIS — E669 Obesity, unspecified: Secondary | ICD-10-CM

## 2021-09-05 DIAGNOSIS — N1832 Chronic kidney disease, stage 3b: Secondary | ICD-10-CM

## 2021-09-05 DIAGNOSIS — G9332 Myalgic encephalomyelitis/chronic fatigue syndrome: Secondary | ICD-10-CM

## 2021-09-05 DIAGNOSIS — F339 Major depressive disorder, recurrent, unspecified: Secondary | ICD-10-CM

## 2021-09-05 DIAGNOSIS — R531 Weakness: Secondary | ICD-10-CM

## 2021-09-05 DIAGNOSIS — I272 Pulmonary hypertension, unspecified: Secondary | ICD-10-CM

## 2021-09-05 DIAGNOSIS — M8000XD Age-related osteoporosis with current pathological fracture, unspecified site, subsequent encounter for fracture with routine healing: Secondary | ICD-10-CM

## 2021-09-05 DIAGNOSIS — R5382 Chronic fatigue, unspecified: Secondary | ICD-10-CM

## 2021-09-05 DIAGNOSIS — M792 Neuralgia and neuritis, unspecified: Secondary | ICD-10-CM

## 2021-09-05 NOTE — Chronic Care Management (AMB) (Signed)
Chronic Care Management    Clinical Social Work Note  09/05/2021 Name: Amy Wilson MRN: [id number] DOB: [birth year]  Amy Wilson is a 70 y.o. year old female who is a primary care patient of Metheney, Rene Kocher, MD. The CCM team was consulted to assist the patient with chronic disease management and/or care coordination needs related to: Level of Care Concerns.   Engaged with patient's son by telephone for follow-up visit in response to provider referral for social work chronic care management and care coordination services.   Consent to Services:  The patient was given information about Chronic Care Management services, agreed to services, and gave verbal consent prior to initiation of services.  Please see initial visit note for detailed documentation.   Patient agreed to services and consent obtained.   Assessment: Review of patient past medical history, allergies, medications, and health status, including review of relevant consultants reports was performed today as part of a comprehensive evaluation and provision of chronic care management and care coordination services.     SDOH (Social Determinants of Health) assessments and interventions performed:    Advanced Directives Status: Not addressed in this encounter.  CCM Care Plan  Allergies  Allergen Reactions   Latex Itching and Rash    Outpatient Encounter Medications as of 09/05/2021  Medication Sig Note   calcium carbonate (OS-CAL - DOSED IN MG OF ELEMENTAL CALCIUM) 1250 (500 Ca) MG tablet Take 1 tablet by mouth daily. (Patient not taking: Reported on 08/18/2021)    esomeprazole (NEXIUM) 20 MG capsule Take 10 mg by mouth daily before breakfast. Take 10 mg 30 minutes before breakfast 06/11/2021: Reports takes one 20 mg capsule every morning   gabapentin (NEURONTIN) 300 MG capsule Take 300 mg by mouth in the morning, at noon, and at bedtime. 08/18/2021: Takes 7 times daily, managed by neurologist.   magnesium oxide (MAG-OX)  400 MG tablet Take by mouth.    nitrofurantoin (MACRODANTIN) 50 MG capsule Take 1 capsule (50 mg total) by mouth at bedtime. For prophylaxis for bladder infection    ondansetron (ZOFRAN) 8 MG tablet Take by mouth. 06/11/2021: Reports takes prn   oxybutynin (DITROPAN) 5 MG tablet Take 1 tablet by mouth twice daily    PARoxetine (PAXIL) 20 MG tablet Take 1 tablet (20 mg total) by mouth daily.    potassium chloride (KLOR-CON) 10 MEQ tablet Take by mouth. (Patient not taking: Reported on 08/18/2021)    predniSONE (DELTASONE) 20 MG tablet Take 20 mg by mouth every morning. (Patient not taking: Reported on 08/18/2021)    senna-docusate (SENOKOT-S) 8.6-50 MG tablet Take 1 tablet by mouth daily. 06/11/2021: Reports takes as needed   No facility-administered encounter medications on file as of 09/05/2021.    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   Left leg weakness 06/02/2021   Recurrent UTI 11/27/2020   Neutropenia associated with infection ([place]) 11/23/2020   Mixed stress and urge urinary incontinence 10/22/2020   Generalized weakness 10/22/2020   Pancytopenia due to antineoplastic chemotherapy ([place]) 10/13/2020   Cerebellar ataxia in diseases classified elsewhere ([place]) 08/08/2020   Retroperitoneal lymphadenopathy 08/08/2020   Peripheral neuropathic pain 07/22/2020   GERD (gastroesophageal reflux disease) 07/03/2020   Stage 3b chronic kidney disease ([place]) 07/03/2020   Bilateral ureteral obstruction 05/27/2020   Hydronephrosis 05/18/2019   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[place]) 05/03/2019   Endometrial adenocarcinoma ([place]) 03/03/2019  Closed compression fracture of body of L1 vertebra ([place]) 02/24/2019   Age-related osteoporosis with current pathological fracture with routine healing 02/24/2019   Fall 02/23/2019   Moderate to severe pulmonary hypertension ([place]) 02/21/2019   Pelvic  mass 02/20/2019   Hiatal hernia 02/20/2019   Fibroid uterus 02/20/2019   Bladder wall thickening 02/20/2019   Aortic atherosclerosis ([place]) 02/20/2019   Obesity (BMI 30-39.9) 10/05/2014   ARM PAIN, LEFT 10/13/2010   INSOMNIA 05/13/2009   DIZZINESS 09/13/2007   Hyperlipidemia 08/19/2007   Depression, recurrent ([place]) 09/28/2006   HYPERTENSION, BENIGN SYSTEMIC 09/28/2006   MENOPAUSAL SYNDROME 09/28/2006   VASOVAGAL SYNCOPE 09/28/2006   FATIGUE/MALAISE 09/28/2006    Conditions to be addressed/monitored: HTN and Depression.  Level of Care Concerns, ADL/IADL Limitations, Mental Health Concerns, Limited Access to Caregiver, and Lacks Knowledge of [employer].  Care Plan : LCSW Plan of Care  Updates made by Francis Gaines, LCSW since 09/05/2021 12:00 AM     Problem: Mobility and Independence.   Priority: High     Goal: Mobility and Independence Optimized.   Start Date: 06/13/2021  Expected End Date: 09/12/2021  This Visit's Progress: On track  Recent Progress: On track  Priority: High  Note:   Current Barriers:  Chronic disease management support and education needs related to Mobility Issues, Fall Risk, Endometrial Adenocarcinoma, Depression, Hypertension, Left Leg Weakness, Fatigue, Stage III Chronic Kidney Disease, Osteoporosis, Obesity, Generalized Weakness and Dizziness. 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   Prior interventions have been Ineffective (I.e home health physical and occupational therapy and aquatic therapy) Clinical Goal(s):  Patient will work with LCSW to identify and address any acute and chronic care coordination needs related to the self-health management of Depression, Endometrial Adenocarcinoma, Fatigue, Left Leg Weakness, Dizziness and Osteoporosis.    LCSW Interventions:  Inter-disciplinary care team collaboration (see longitudinal plan of care). Collaboration with Primary Care Physician,  Dr. [name] regarding development and update of comprehensive plan of care as evidenced by provider attestation and co-signature.  Patient Goals/Self-Care Activities: Continue to receive inpatient physical and occupational therapies at [facility], [facility] of [facility].    Collaboration with discharge planning coordinator at [facility] [phone]) to ensure that all durable medical equipment has been ordered and delivered to your home prior to discharge, as well as to ensure that home health services are in place at time of discharge.   Contact LCSW directly (# [phone]) if you have questions, need assistance, or if additional social work needs are identified between now and our next scheduled follow-up outreach call. Follow-Up Plan:  LCSW will make contact with patient on 09/15/2021 at 12:15pm      Follow-Up Plan:   09/15/2021 at 12:15pm      [place] Worker [place] [phone]

## 2021-09-05 NOTE — Patient Instructions (Signed)
Visit Information  PATIENT GOALS:  Goals Addressed               This Visit's Progress     Mobility and Independence Optimized. (pt-stated)   On track     Timeframe:  Short-Term Goal Priority:  High Start Date:   06/13/2021                         Expected End Date:   09/12/2021  Follow-Up Date:  09/15/2021 at 12:15pm  Patient Goals/Self-Care Activities: Continue to receive inpatient physical and occupational therapies at [facility], affiliate of [facility].    Collaboration with discharge planning coordinator at [place] [phone]) to ensure that all durable medical equipment has been ordered and delivered to your home prior to discharge, as well as to ensure that home health services are in place at time of discharge.   Contact LCSW directly (# [phone]) if you have questions, need assistance, or if additional social work needs are identified between now and our next scheduled follow-up outreach call.        Patient verbalizes understanding of instructions provided today and agrees to view in [place].   Telephone follow up appointment with care management team member scheduled for: 09/15/2021 at 12:15pm  [place] Clinical Social Worker [place] [phone]

## 2021-09-15 ENCOUNTER — Ambulatory Visit: Payer: Medicare HMO | Admitting: *Deleted

## 2021-09-15 DIAGNOSIS — E785 Hyperlipidemia, unspecified: Secondary | ICD-10-CM

## 2021-09-15 DIAGNOSIS — R42 Dizziness and giddiness: Secondary | ICD-10-CM

## 2021-09-15 DIAGNOSIS — M792 Neuralgia and neuritis, unspecified: Secondary | ICD-10-CM

## 2021-09-15 DIAGNOSIS — I272 Pulmonary hypertension, unspecified: Secondary | ICD-10-CM

## 2021-09-15 DIAGNOSIS — E669 Obesity, unspecified: Secondary | ICD-10-CM

## 2021-09-15 DIAGNOSIS — G9332 Myalgic encephalomyelitis/chronic fatigue syndrome: Secondary | ICD-10-CM

## 2021-09-15 DIAGNOSIS — R531 Weakness: Secondary | ICD-10-CM

## 2021-09-15 DIAGNOSIS — M8000XD Age-related osteoporosis with current pathological fracture, unspecified site, subsequent encounter for fracture with routine healing: Secondary | ICD-10-CM

## 2021-09-15 DIAGNOSIS — N1832 Chronic kidney disease, stage 3b: Secondary | ICD-10-CM

## 2021-09-15 DIAGNOSIS — R5382 Chronic fatigue, unspecified: Secondary | ICD-10-CM

## 2021-09-15 NOTE — Chronic Care Management (AMB) (Signed)
Chronic Care Management    Clinical Social Work Note  09/15/2021 Name: Amy Wilson MRN: [id number] DOB: 14-Apr-1951  Amy Wilson is a 70 y.o. year old female who is a primary care patient of Wilson, Amy Kocher, MD. The CCM team was consulted to assist the patient with chronic disease management and/or care coordination needs related to: Level of Care Concerns.   Engaged with patient's son by telephone for follow-up visit in response to provider referral for social work chronic care management and care coordination services.   Consent to Services:  The patient was given information about Chronic Care Management services, agreed to services, and gave verbal consent prior to initiation of services.  Please see initial visit note for detailed documentation.   Patient agreed to services and consent obtained.   Assessment: Review of patient past medical history, allergies, medications, and health status, including review of relevant consultants reports was performed today as part of a comprehensive evaluation and provision of chronic care management and care coordination services.     SDOH (Social Determinants of Health) assessments and interventions performed:    Advanced Directives Status: Not addressed in this encounter.  CCM Care Plan  Allergies  Allergen Reactions   Latex Itching and Rash    Outpatient Encounter Medications as of 09/15/2021  Medication Sig Note   calcium carbonate (OS-CAL - DOSED IN MG OF ELEMENTAL CALCIUM) 1250 (500 Ca) MG tablet Take 1 tablet by mouth daily. (Patient not taking: Reported on 08/18/2021)    esomeprazole (NEXIUM) 20 MG capsule Take 10 mg by mouth daily before breakfast. Take 10 mg 30 minutes before breakfast 06/11/2021: Reports takes one 20 mg capsule every morning   gabapentin (NEURONTIN) 300 MG capsule Take 300 mg by mouth in the morning, at noon, and at bedtime. 08/18/2021: Takes 7 times daily, managed by neurologist.   magnesium oxide (MAG-OX)  400 MG tablet Take by mouth.    nitrofurantoin (MACRODANTIN) 50 MG capsule Take 1 capsule (50 mg total) by mouth at bedtime. For prophylaxis for bladder infection    ondansetron (ZOFRAN) 8 MG tablet Take by mouth. 06/11/2021: Reports takes prn   oxybutynin (DITROPAN) 5 MG tablet Take 1 tablet by mouth twice daily    PARoxetine (PAXIL) 20 MG tablet Take 1 tablet (20 mg total) by mouth daily.    potassium chloride (KLOR-CON) 10 MEQ tablet Take by mouth. (Patient not taking: Reported on 08/18/2021)    predniSONE (DELTASONE) 20 MG tablet Take 20 mg by mouth every morning. (Patient not taking: Reported on 08/18/2021)    senna-docusate (SENOKOT-S) 8.6-50 MG tablet Take 1 tablet by mouth daily. 06/11/2021: Reports takes as needed   No facility-administered encounter medications on file as of 09/15/2021.    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   Left leg weakness 06/02/2021   Recurrent UTI 11/27/2020   Neutropenia associated with infection ([place]) 11/23/2020   Mixed stress and urge urinary incontinence 10/22/2020   Generalized weakness 10/22/2020   Pancytopenia due to antineoplastic chemotherapy ([place]) 10/13/2020   Cerebellar ataxia in diseases classified elsewhere ([place]) 08/08/2020   Retroperitoneal lymphadenopathy 08/08/2020   Peripheral neuropathic pain 07/22/2020   GERD (gastroesophageal reflux disease) 07/03/2020   Stage 3b chronic kidney disease ([place]) 07/03/2020   Bilateral ureteral obstruction 05/27/2020   Hydronephrosis 05/18/2019   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/03/2019  Closed compression fracture of body of L1 vertebra ([place]) 02/24/2019   Age-related osteoporosis with current pathological fracture with routine healing 02/24/2019   Fall 02/23/2019   Moderate to severe pulmonary hypertension ([place]) 02/21/2019   Pelvic  mass 02/20/2019   Hiatal hernia 02/20/2019   Fibroid uterus 02/20/2019   Bladder wall thickening 02/20/2019   Aortic atherosclerosis ([place]) 02/20/2019   Obesity (BMI 30-39.9) 10/05/2014   ARM PAIN, LEFT 10/13/2010   INSOMNIA 05/13/2009   DIZZINESS 09/13/2007   Hyperlipidemia 08/19/2007   Depression, recurrent ([place]) 09/28/2006   HYPERTENSION, BENIGN SYSTEMIC 09/28/2006   MENOPAUSAL SYNDROME 09/28/2006   VASOVAGAL SYNCOPE 09/28/2006   FATIGUE/MALAISE 09/28/2006    Conditions to be addressed/monitored: HTN and CKD Stage 4.  Limited Social Support, Level of Care Concerns, ADL/IADL Limitations, and Limited Access to Caregiver.  Care Plan : LCSW Plan of Care  Updates made by Francis Gaines, LCSW since 09/15/2021 12:00 AM     Problem: Mobility and Independence. Resolved 09/15/2021  Priority: High     Goal: Mobility and Independence Optimized. Completed 09/15/2021  Start Date: 06/13/2021  Expected End Date: 09/15/2021  This Visit's Progress: On track  Recent Progress: On track  Priority: High  Note:   Current Barriers:  Chronic disease management support and education needs related to Mobility Issues, Fall Risk, Endometrial Adenocarcinoma, Depression, Hypertension, Left Leg Weakness, Fatigue, Stage III Chronic Kidney Disease, Osteoporosis, Obesity, Generalized Weakness and Dizziness. Clinical Goal(s):  Patient will work with LCSW to identify and address any acute and chronic care coordination needs related to the self-health management of Depression, Endometrial Adenocarcinoma, Fatigue, Left Leg Weakness, Dizziness and Osteoporosis.    LCSW Interventions:  Collaboration with Primary Care Physician, Dr. [name] regarding development and update of comprehensive plan of care as evidenced by provider attestation and co-signature.  Patient Goals/Self-Care Activities: Collaboration with son to confirm that you are still progressing well with therapies at [facility] (# [phone]), as well as to ensure that home health services and durable medical equipment has been ordered and arranged through [facility].   Contact LCSW directly (# [id number]) if you have questions, need assistance, or if additional social work needs are identified within the near future. Follow-Up Plan:  No Follow-Up Required     Nat Christen LCSW Licensed Clinical Social Worker [place] [phone]

## 2021-09-15 NOTE — Patient Instructions (Signed)
Visit Information  PATIENT GOALS:  Goals Addressed               This Visit's Progress     COMPLETED: Mobility and Independence Optimized. (pt-stated)   On track     Timeframe:  Short-Term Goal Priority:  High Start Date:   06/13/2021                         Expected End Date:  09/15/2021  Follow-Up Date:  No Follow-Up Required  Patient Goals/Self-Care Activities: Collaboration with son to confirm that you are still progressing well with therapies at [facility] (# [phone]), as well as to ensure that home health services and durable medical equipment has been ordered and arranged through [facility].   Contact LCSW directly (# [id number]) if you have questions, need assistance, or if additional social work needs are identified within the near future.        Patient verbalizes understanding of instructions provided today and agrees to view in [place].   No Follow-Up Required.  [place] Licensed Clinical Social Worker [place] [phone]

## 2021-09-17 ENCOUNTER — Telehealth: Payer: Medicare HMO

## 2021-09-18 ENCOUNTER — Telehealth: Payer: Medicare HMO

## 2021-09-18 ENCOUNTER — Other Ambulatory Visit: Payer: Self-pay

## 2021-09-18 NOTE — Progress Notes (Deleted)
Chronic Care Management Pharmacy Note  09/18/2021 Name:  Amy Wilson MRN:  [id number] DOB:  07-24-1951  Summary: addressed HTN, mental health, chronic pain. Patient has upcoming surgery 08/20/21.  Recommendations/Changes made from today's visit: none, but will readdress optimizing meds to support her mental health & pain control in future visits.  Plan: f/u with pharmacist in 1 month  Subjective: Amy Wilson is an 70 y.o. year old female who is a primary patient of Metheney, Rene Kocher, MD.  The CCM team was consulted for assistance with disease management and care coordination needs.    Engaged with patient by telephone for initial visit in response to provider referral for pharmacy case management and/or care coordination services.   Consent to Services:  The patient was given information about Chronic Care Management services, agreed to services, and gave verbal consent prior to initiation of services.  Please see initial visit note for detailed documentation.   Patient Care Team: Hali Marry, MD as PCP - General Luretha Rued, RN as Case Manager Darius Bump, [facility] as Pharmacist (Pharmacist)  Recent office visits: 08/04/21 - Dr Madilyn Fireman (PCP)- pneumococcal vaccine, HTN f/u, meningeal tumor/pain.   Recent consult visits: 08/14/21 - Nat Christen (CCM Social worker), assisting through journey/placement needs after upcoming surgery  08/06/21 - [name] (Case Mgr), community resource needs  Hospital visits: None in previous 6 months  Objective:  Lab Results  Component Value Date   CREATININE 0.99 (H) 04/22/2021   CREATININE 0.97 (H) 03/05/2020   CREATININE 0.87 05/20/2018    Lab Results  Component Value Date   HGBA1C 5.0 08/04/2021   Last diabetic Eye exam: No results found for: HMDIABEYEEXA  Last diabetic Foot exam: No results found for: HMDIABFOOTEX      Component Value Date/Time   CHOL 174 04/22/2021 1535   TRIG 157 (H) 04/22/2021 1535    HDL 39 (L) 04/22/2021 1535   CHOLHDL 4.5 04/22/2021 1535   LDLCALC 108 (H) 04/22/2021 1535    Hepatic Function Latest Ref Rng & Units 04/22/2021 03/05/2020 05/20/2018  Total Protein 6.1 - 8.1 g/dL 7.4 7.2 7.7  Albumin 3.6 - 5.1 g/dL - - -  AST 10 - 35 U/L _0 ALT 6 - 29 U/L _1 Alk Phosphatase 33 - 130 U/L - - -  Total Bilirubin 0.2 - 1.2 mg/dL 0.3 0.5 0.5    Lab Results  Component Value Date/Time   TSH 3.70 03/05/2020 12:58 PM   TSH 4.24 05/20/2018 10:19 AM    CBC Latest Ref Rng & Units 03/05/2020 02/23/2017 09/01/2014  WBC 3.8 - 10.8 Thousand/uL 7.2 5.2 5.1  Hemoglobin 11.7 - 15.5 g/dL 13.1 15.1 12.7  Hematocrit 35.0 - 45.0 % 39.8 45.1(H) -  Platelets 140 - 400 Thousand/uL 158 200 -    Lab Results  Component Value Date/Time   VD25OH 38 04/22/2021 03:35 PM    Social History   Tobacco Use  Smoking Status Never  Smokeless Tobacco Never   BP Readings from Last 3 Encounters:  08/04/21 110/63  04/21/21 120/60  03/06/21 108/65   Pulse Readings from Last 3 Encounters:  08/04/21 85  04/21/21 (!) 113  03/06/21 98   Wt Readings from Last 3 Encounters:  04/21/21 145 lb (65.8 kg)  03/06/21 145 lb (65.8 kg)  08/08/20 152 lb (68.9 kg)    Assessment: Review of patient past medical history, allergies, medications, health status, including review of consultants reports, laboratory and other test  data, was performed as part of comprehensive evaluation and provision of chronic care management services.   SDOH:  (Social Determinants of Health) assessments and interventions performed:    CCM Care Plan  Allergies  Allergen Reactions   Latex Itching and Rash    Medications Reviewed Today     Reviewed by Francis Gaines, LCSW (Social Worker) on 09/15/21 at 1219  Med List Status: <None>   Medication Order Taking? Sig Documenting Provider Last Dose Status Informant  calcium carbonate (OS-CAL - DOSED IN MG OF ELEMENTAL CALCIUM) 1250 (500 Ca) MG tablet [id number]  No Take 1 tablet by mouth daily.  Patient not taking: Reported on 08/18/2021   [provider] Not Taking Active   esomeprazole (NEXIUM) 20 MG capsule [id number] No Take 10 mg by mouth daily before breakfast. Take 10 mg 30 minutes before breakfast [provider] Taking Active            Med Note Juleen China, [place] Brick M   Wed Jun 11, 2021  1:32 PM) Reports takes one 20 mg capsule every morning  gabapentin (NEURONTIN) 300 MG capsule [id number] No Take 300 mg by mouth in the morning, at noon, and at bedtime. [provider] Taking Active            Med Note Rikki Spearing Aug 18, 2021  2:04 PM) Takes 7 times daily, managed by neurologist.  magnesium oxide (MAG-OX) 400 MG tablet [id number] No Take by mouth. [provider] Taking Active   nitrofurantoin (MACRODANTIN) 50 MG capsule [id number] No Take 1 capsule (50 mg total) by mouth at bedtime. For prophylaxis for bladder infection Hali Marry, MD Taking Active   ondansetron [facility]) 8 MG tablet [id number] No Take by mouth. [provider] Taking Active            Med Note Juleen China, Deno Etienne   Wed Jun 11, 2021  1:34 PM) Reports takes prn  oxybutynin (DITROPAN) 5 MG tablet [id number] No Take 1 tablet by mouth twice daily Hali Marry, MD Taking Active   PARoxetine (PAXIL) 20 MG tablet [id number] No Take 1 tablet (20 mg total) by mouth daily. Hali Marry, MD Taking Active   potassium chloride (KLOR-CON) 10 MEQ tablet [id number] No Take by mouth.  Patient not taking: Reported on 08/18/2021   [place], DO Not Taking Active            Med Note Dorene Ar Aug 20, 2021  9:22 AM)    predniSONE (DELTASONE) 20 MG tablet [id number] No Take 20 mg by mouth every morning.  Patient not taking: Reported on 08/18/2021   [provider] Not Taking Active   senna-docusate (SENOKOT-S) 8.6-50 MG tablet [id number] No Take 1 tablet by mouth daily. 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   Left leg weakness 06/02/2021   Recurrent UTI 11/27/2020   Neutropenia associated with infection ([place]) 11/23/2020   Mixed stress and urge urinary incontinence 10/22/2020   Generalized weakness 10/22/2020   Pancytopenia due to antineoplastic chemotherapy ([place]) 10/13/2020   Cerebellar ataxia in diseases classified elsewhere ([place]) 08/08/2020   Retroperitoneal lymphadenopathy 08/08/2020   Peripheral  neuropathic pain 07/22/2020   GERD (gastroesophageal reflux disease) 07/03/2020   Stage 3b chronic kidney disease ([place]) 07/03/2020   Bilateral ureteral obstruction 05/27/2020   Hydronephrosis 05/18/2019   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[facility]) 05/03/2019   Endometrial adenocarcinoma ([place]) 03/03/2019   Closed compression fracture of body of L1 vertebra ([place]) 02/24/2019   Age-related osteoporosis with current pathological fracture with routine healing 02/24/2019   Fall 02/23/2019   Moderate to severe pulmonary hypertension ([place]) 02/21/2019   Pelvic mass 02/20/2019   Hiatal hernia 02/20/2019   Fibroid uterus 02/20/2019   Bladder wall thickening 02/20/2019   Aortic atherosclerosis ([place]) 02/20/2019   Obesity (BMI 30-39.9) 10/05/2014   ARM PAIN, LEFT 10/13/2010   INSOMNIA 05/13/2009   DIZZINESS 09/13/2007   Hyperlipidemia 08/19/2007   Depression, recurrent ([place]) 09/28/2006   HYPERTENSION, BENIGN SYSTEMIC 09/28/2006   MENOPAUSAL SYNDROME 09/28/2006   VASOVAGAL SYNCOPE 09/28/2006   FATIGUE/MALAISE 09/28/2006    Immunization History  Administered Date(s) Administered   Fluad Quad(high Dose 65+) 09/11/2019   Influenza, High Dose Seasonal PF 03/09/2019   Influenza,inj,Quad PF,6+ Mos 10/16/2020   PNEUMOCOCCAL CONJUGATE-20  08/04/2021   Pneumococcal Polysaccharide-23 03/09/2019    Conditions to be addressed/monitored: HTN, Depression, and Chronic Pain  There are no care plans that you recently modified to display for this patient.   Medication Assistance: None required.  Patient affirms current coverage meets needs.  Patient's preferred pharmacy is:  [place], [place] [facility] [place] [address] [place] [address] Phone: [phone] Fax: [phone]  Uses pill box? No - daughter helps with medicines Pt endorses 100% compliance  Follow Up:  Patient agrees to Care Plan and Follow-up.  Plan: Telephone follow up appointment with care management team member scheduled for:  1 month  Darius Bump

## 2021-09-19 DIAGNOSIS — E785 Hyperlipidemia, unspecified: Secondary | ICD-10-CM

## 2021-09-19 DIAGNOSIS — F339 Major depressive disorder, recurrent, unspecified: Secondary | ICD-10-CM | POA: Diagnosis not present

## 2021-09-25 ENCOUNTER — Ambulatory Visit (INDEPENDENT_AMBULATORY_CARE_PROVIDER_SITE_OTHER): Payer: Medicare HMO | Admitting: Pharmacist

## 2021-09-25 ENCOUNTER — Other Ambulatory Visit: Payer: Self-pay

## 2021-09-25 DIAGNOSIS — F339 Major depressive disorder, recurrent, unspecified: Secondary | ICD-10-CM

## 2021-09-25 DIAGNOSIS — M792 Neuralgia and neuritis, unspecified: Secondary | ICD-10-CM

## 2021-09-25 NOTE — Patient Instructions (Signed)
Visit Information  PATIENT GOALS:  Goals Addressed             This Visit's Progress    Medication Management       Patient Goals/Self-Care Activities Over the next 90 days, patient will:  take medications as prescribed  Follow Up Plan: Telephone follow up appointment with care management team member scheduled for:  3 months        Patient verbalizes understanding of instructions provided today and agrees to view in [place].   Telephone follow up appointment with care management team member scheduled for: 3 months  Larinda Buttery, PharmD Clinical Pharmacist [facility] [phone]

## 2021-09-25 NOTE — Progress Notes (Signed)
Chronic Care Management Pharmacy Note  09/25/2021 Name:  Amy Wilson MRN:  [id number] DOB:  03/01/1951  Summary: addressed HTN, mental health, chronic pain. Patient had surgery 08/20/21 and is now s/p rehab, still recovering with outpatient PT.  Recommendations/Changes made from today's visit: none, patient feels well controlled after surgery  Plan: f/u with pharmacist in 3 months  Subjective: Amy Wilson is an 70 y.o. year old female who is a primary patient of Metheney, Rene Kocher, MD.  The CCM team was consulted for assistance with disease management and care coordination needs.    Engaged with patient by telephone for initial visit in response to provider referral for pharmacy case management and/or care coordination services.   Consent to Services:  The patient was given information about Chronic Care Management services, agreed to services, and gave verbal consent prior to initiation of services.  Please see initial visit note for detailed documentation.   Patient Care Team: Hali Marry, MD as PCP - General Luretha Rued, RN as Case Manager Darius Bump, [facility] as Pharmacist (Pharmacist)  Recent office visits: 08/04/21 - Dr Madilyn Fireman (PCP)- pneumococcal vaccine, HTN f/u, meningeal tumor/pain.   Recent consult visits: 08/14/21 - Nat Christen (CCM Social worker), assisting through journey/placement needs after upcoming surgery  08/06/21 - [name] (Case Mgr), community resource needs  Hospital visits: None in previous 6 months  Objective:  Lab Results  Component Value Date   CREATININE 0.99 (H) 04/22/2021   CREATININE 0.97 (H) 03/05/2020   CREATININE 0.87 05/20/2018    Lab Results  Component Value Date   HGBA1C 5.0 08/04/2021   Last diabetic Eye exam: No results found for: HMDIABEYEEXA  Last diabetic Foot exam: No results found for: HMDIABFOOTEX      Component Value Date/Time   CHOL 174 04/22/2021 1535   TRIG 157 (H) 04/22/2021 1535    HDL 39 (L) 04/22/2021 1535   CHOLHDL 4.5 04/22/2021 1535   LDLCALC 108 (H) 04/22/2021 1535    Hepatic Function Latest Ref Rng & Units 04/22/2021 03/05/2020 05/20/2018  Total Protein 6.1 - 8.1 g/dL 7.4 7.2 7.7  Albumin 3.6 - 5.1 g/dL - - -  AST 10 - 35 U/L _0 ALT 6 - 29 U/L _1 Alk Phosphatase 33 - 130 U/L - - -  Total Bilirubin 0.2 - 1.2 mg/dL 0.3 0.5 0.5    Lab Results  Component Value Date/Time   TSH 3.70 03/05/2020 12:58 PM   TSH 4.24 05/20/2018 10:19 AM    CBC Latest Ref Rng & Units 03/05/2020 02/23/2017 09/01/2014  WBC 3.8 - 10.8 Thousand/uL 7.2 5.2 5.1  Hemoglobin 11.7 - 15.5 g/dL 13.1 15.1 12.7  Hematocrit 35.0 - 45.0 % 39.8 45.1(H) -  Platelets 140 - 400 Thousand/uL 158 200 -    Lab Results  Component Value Date/Time   VD25OH 38 04/22/2021 03:35 PM    Social History   Tobacco Use  Smoking Status Never  Smokeless Tobacco Never   BP Readings from Last 3 Encounters:  08/04/21 110/63  04/21/21 120/60  03/06/21 108/65   Pulse Readings from Last 3 Encounters:  08/04/21 85  04/21/21 (!) 113  03/06/21 98   Wt Readings from Last 3 Encounters:  04/21/21 145 lb (65.8 kg)  03/06/21 145 lb (65.8 kg)  08/08/20 152 lb (68.9 kg)    Assessment: Review of patient past medical history, allergies, medications, health status, including review of consultants reports, laboratory and other test data,  was performed as part of comprehensive evaluation and provision of chronic care management services.   SDOH:  (Social Determinants of Health) assessments and interventions performed:    CCM Care Plan  Allergies  Allergen Reactions   Latex Itching and Rash    Medications Reviewed Today     Reviewed by Francis Gaines, LCSW (Social Worker) on 09/15/21 at 1219  Med List Status: <None>   Medication Order Taking? Sig Documenting Provider Last Dose Status Informant  calcium carbonate (OS-CAL - DOSED IN MG OF ELEMENTAL CALCIUM) 1250 (500 Ca) MG tablet [id number] No  Take 1 tablet by mouth daily.  Patient not taking: Reported on 08/18/2021   [provider] Not Taking Active   esomeprazole (NEXIUM) 20 MG capsule [id number] No Take 10 mg by mouth daily before breakfast. Take 10 mg 30 minutes before breakfast [provider] Taking Active            Med Note Juleen China, [place] Brick M   Wed Jun 11, 2021  1:32 PM) Reports takes one 20 mg capsule every morning  gabapentin (NEURONTIN) 300 MG capsule [id number] No Take 300 mg by mouth in the morning, at noon, and at bedtime. [provider] Taking Active            Med Note Rikki Spearing Aug 18, 2021  2:04 PM) Takes 7 times daily, managed by neurologist.  magnesium oxide (MAG-OX) 400 MG tablet [id number] No Take by mouth. [provider] Taking Active   nitrofurantoin (MACRODANTIN) 50 MG capsule [id number] No Take 1 capsule (50 mg total) by mouth at bedtime. For prophylaxis for bladder infection Hali Marry, MD Taking Active   ondansetron [facility]) 8 MG tablet [id number] No Take by mouth. [provider] Taking Active            Med Note Juleen China, Deno Etienne   Wed Jun 11, 2021  1:34 PM) Reports takes prn  oxybutynin (DITROPAN) 5 MG tablet [id number] No Take 1 tablet by mouth twice daily Hali Marry, MD Taking Active   PARoxetine (PAXIL) 20 MG tablet [id number] No Take 1 tablet (20 mg total) by mouth daily. Hali Marry, MD Taking Active   potassium chloride (KLOR-CON) 10 MEQ tablet [id number] No Take by mouth.  Patient not taking: Reported on 08/18/2021   Queen Slough, DO Not Taking Active            Med Note Dorene Ar Aug 20, 2021  9:22 AM)    predniSONE (DELTASONE) 20 MG tablet [id number] No Take 20 mg by mouth every morning.  Patient not taking: Reported on 08/18/2021   [provider] Not Taking Active   senna-docusate (SENOKOT-S) 8.6-50 MG tablet [id number] No Take 1 tablet by mouth daily. 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   Left leg weakness 06/02/2021   Recurrent UTI 11/27/2020   Neutropenia associated with infection ([place]) 11/23/2020   Mixed stress and urge urinary incontinence 10/22/2020   Generalized weakness 10/22/2020   Pancytopenia due to antineoplastic chemotherapy ([place]) 10/13/2020   Cerebellar ataxia in diseases classified elsewhere ([place]) 08/08/2020   Retroperitoneal lymphadenopathy 08/08/2020   Peripheral neuropathic  pain 07/22/2020   GERD (gastroesophageal reflux disease) 07/03/2020   Stage 3b chronic kidney disease ([place]) 07/03/2020   Bilateral ureteral obstruction 05/27/2020   Hydronephrosis 05/18/2019   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[place]) 05/03/2019   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   Closed compression fracture of body of L1 vertebra ([place]) 02/24/2019   Age-related osteoporosis with current pathological fracture with routine healing 02/24/2019   Fall 02/23/2019   Moderate to severe pulmonary hypertension ([place]) 02/21/2019   Pelvic mass 02/20/2019   Hiatal hernia 02/20/2019   Fibroid uterus 02/20/2019   Bladder wall thickening 02/20/2019   Aortic atherosclerosis ([place]) 02/20/2019   Obesity (BMI 30-39.9) 10/05/2014   ARM PAIN, LEFT 10/13/2010   INSOMNIA 05/13/2009   DIZZINESS 09/13/2007   Hyperlipidemia 08/19/2007   Depression, recurrent ([place]) 09/28/2006   HYPERTENSION, BENIGN SYSTEMIC 09/28/2006   MENOPAUSAL SYNDROME 09/28/2006   VASOVAGAL SYNCOPE 09/28/2006   FATIGUE/MALAISE 09/28/2006    Immunization History  Administered Date(s) Administered   Fluad Quad(high Dose 65+) 09/11/2019   Influenza, High Dose Seasonal PF 03/09/2019   Influenza,inj,Quad PF,6+ Mos 10/16/2020   PNEUMOCOCCAL CONJUGATE-20  08/04/2021   Pneumococcal Polysaccharide-23 03/09/2019    Conditions to be addressed/monitored: HTN, Depression, and Chronic Pain  There are no care plans that you recently modified to display for this patient.   Medication Assistance: None required.  Patient affirms current coverage meets needs.  Patient's preferred pharmacy is:  [place], [place] - [place] [address] [place] [address] Phone: [phone] Fax: [phone]  Uses pill box? No - daughter helps with medicines Pt endorses 100% compliance  Follow Up:  Patient agrees to Care Plan and Follow-up.  Plan: Telephone follow up appointment with care management team member scheduled for:  3 months  Darius Bump

## 2021-09-26 DIAGNOSIS — Z4889 Encounter for other specified surgical aftercare: Secondary | ICD-10-CM | POA: Diagnosis not present

## 2021-09-26 DIAGNOSIS — Z9889 Other specified postprocedural states: Secondary | ICD-10-CM | POA: Diagnosis not present

## 2021-09-26 DIAGNOSIS — R531 Weakness: Secondary | ICD-10-CM | POA: Diagnosis not present

## 2021-10-01 ENCOUNTER — Encounter: Payer: Self-pay | Admitting: Family Medicine

## 2021-10-01 ENCOUNTER — Ambulatory Visit (INDEPENDENT_AMBULATORY_CARE_PROVIDER_SITE_OTHER): Payer: Medicare HMO | Admitting: Family Medicine

## 2021-10-01 VITALS — BP 121/66 | HR 96 | Temp 98.1°F | Ht 65.0 in | Wt 165.0 lb

## 2021-10-01 DIAGNOSIS — R2689 Other abnormalities of gait and mobility: Secondary | ICD-10-CM | POA: Diagnosis not present

## 2021-10-01 DIAGNOSIS — D500 Iron deficiency anemia secondary to blood loss (chronic): Secondary | ICD-10-CM | POA: Diagnosis not present

## 2021-10-01 DIAGNOSIS — E11621 Type 2 diabetes mellitus with foot ulcer: Secondary | ICD-10-CM | POA: Diagnosis not present

## 2021-10-01 DIAGNOSIS — M6281 Muscle weakness (generalized): Secondary | ICD-10-CM | POA: Diagnosis not present

## 2021-10-01 DIAGNOSIS — D61810 Antineoplastic chemotherapy induced pancytopenia: Secondary | ICD-10-CM

## 2021-10-01 DIAGNOSIS — Z8603 Personal history of neoplasm of uncertain behavior: Secondary | ICD-10-CM

## 2021-10-01 DIAGNOSIS — L89152 Pressure ulcer of sacral region, stage 2: Secondary | ICD-10-CM

## 2021-10-01 DIAGNOSIS — N3000 Acute cystitis without hematuria: Secondary | ICD-10-CM

## 2021-10-01 DIAGNOSIS — Z9889 Other specified postprocedural states: Secondary | ICD-10-CM | POA: Diagnosis not present

## 2021-10-01 DIAGNOSIS — Z23 Encounter for immunization: Secondary | ICD-10-CM | POA: Diagnosis not present

## 2021-10-01 DIAGNOSIS — Z4789 Encounter for other orthopedic aftercare: Secondary | ICD-10-CM | POA: Diagnosis not present

## 2021-10-01 DIAGNOSIS — R8290 Unspecified abnormal findings in urine: Secondary | ICD-10-CM | POA: Diagnosis not present

## 2021-10-01 DIAGNOSIS — L89602 Pressure ulcer of unspecified heel, stage 2: Secondary | ICD-10-CM

## 2021-10-01 DIAGNOSIS — I10 Essential (primary) hypertension: Secondary | ICD-10-CM

## 2021-10-01 DIAGNOSIS — M5384 Other specified dorsopathies, thoracic region: Secondary | ICD-10-CM | POA: Diagnosis not present

## 2021-10-01 DIAGNOSIS — T451X5A Adverse effect of antineoplastic and immunosuppressive drugs, initial encounter: Secondary | ICD-10-CM

## 2021-10-01 LAB — POCT URINALYSIS DIP (CLINITEK)
Bilirubin, UA: NEGATIVE
Glucose, UA: NEGATIVE mg/dL
Ketones, POC UA: NEGATIVE mg/dL
Nitrite, UA: NEGATIVE
POC PROTEIN,UA: 30 — AB
Spec Grav, UA: 1.02 (ref 1.010–1.025)
Urobilinogen, UA: 0.2 E.U./dL
pH, UA: 5.5 (ref 5.0–8.0)

## 2021-10-01 MED ORDER — CIPROFLOXACIN HCL 500 MG PO TABS: 500.0000 mg | ORAL_TABLET | Freq: Two times a day (BID) | ORAL | 0 refills | Status: AC

## 2021-10-01 NOTE — Progress Notes (Signed)
Hi Alyshia, your hemoglobin looks gorgeous!

## 2021-10-01 NOTE — Progress Notes (Signed)
Please send for culture

## 2021-10-01 NOTE — Assessment & Plan Note (Signed)
Plan to recheck CBC today. didi receive 2 units PRB this month

## 2021-10-01 NOTE — Progress Notes (Signed)
Established Patient Office Visit  Subjective:  Patient ID: Amy Wilson, female    DOB: 1951/07/13  Age: 70 y.o. MRN: [id number]  CC:  Chief Complaint  Patient presents with   Hospitalization Follow-up          HPI Amy Wilson presents for Hospital F/U.  She had her surgery on August 31 for a spinal tumor of the thoracic spine.  They did a biopsy and excision as well as a laminectomy at T1-3 and 4 and a lateral fusion from T1-T4, by Dr. Alfred Levins.  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  Hypertension- Pt denies chest pain, SOB, dizziness, or heart palpitations.  Taking meds as directed w/o problems.  Denies medication side effects.    He also has a pressure wound on her right buttock area that she would like me to look at today.  It developed since the surgery.  She never had any problems with that before.  She also unfortunately is still getting recurrent UTIs she had another one while she was in rehab and then started getting symptoms again about 2 days ago with dysuria no blood.  No fevers or chills.  Had significant anemia during her hospitalization and had to have 2 units of packed red blood cells.  Past Medical History:  Diagnosis Date   Cancer [facility])    uterine    Past Surgical History:  Procedure Laterality Date   ABDOMINAL HYSTERECTOMY     kidney stents     KNEE ARTHROSCOPY Right     Family History  Problem Relation Age of Onset   Heart disease Father    Colon cancer Maternal Grandmother    Stomach cancer Maternal Grandmother     Social History   Socioeconomic History   Marital status: Divorced    Spouse name: Not on file   Number of children: 2   Years of education: 12   Highest education level: 12th grade  Occupational History   Occupation: Scientist, research (medical)    Comment: retired  Tobacco Use   Smoking status: Never   Smokeless tobacco: Never  Scientific laboratory technician Use: Never used  Substance and Sexual  Activity   Alcohol use: Never   Drug use: No   Sexual activity: Never  Other Topics Concern   Not on file  Social History Narrative   Patient has cancer and doing chemo and radiation. Stays tired all the time   Lives with daughter and son-in-law   Social Determinants of Health   Financial Resource Strain: Low Risk    Difficulty of Paying Living Expenses: Not hard at all  Food Insecurity: No Food Insecurity   Worried About Charity fundraiser in the Last Year: Never true   Arboriculturist in the Last Year: Never true  Transportation Needs: No Transportation Needs   Lack of Transportation (Medical): No   Lack of Transportation (Non-Medical): No  Physical Activity: Inactive   Days of Exercise per Week: 0 days   Minutes of Exercise per Session: 0 min  Stress: No Stress Concern Present   Feeling of Stress : Only a little  Social Connections: Socially Isolated   Frequency of Communication with Friends and Family: More than three times a week   Frequency of Social Gatherings with Friends and Family: More than three times a week   Attends Religious Services: Never   Marine scientist or Organizations: No   Attends Archivist  Meetings: Never   Marital Status: Divorced  Human resources officer Violence: Not At Risk   Fear of Current or Ex-Partner: No   Emotionally Abused: No   Physically Abused: No   Sexually Abused: No    Outpatient Medications Prior to Visit  Medication Sig Dispense Refill   Cholecalciferol 25 MCG (1000 UT) capsule Take 1 capsule by mouth daily.     tamsulosin (FLOMAX) 0.4 MG CAPS capsule      esomeprazole (NEXIUM) 20 MG capsule Take 10 mg by mouth daily before breakfast. Take 10 mg 30 minutes before breakfast     gabapentin (NEURONTIN) 300 MG capsule Take 300 mg by mouth in the morning, at noon, and at bedtime.     magnesium oxide (MAG-OX) 400 MG tablet Take by mouth.     ondansetron (ZOFRAN) 8 MG tablet Take by mouth.     oxybutynin (DITROPAN) 5 MG  tablet Take 1 tablet by mouth twice daily 60 tablet 5   PARoxetine (PAXIL) 20 MG tablet Take 1 tablet (20 mg total) by mouth daily. 90 tablet 1   calcium carbonate (OS-CAL - DOSED IN MG OF ELEMENTAL CALCIUM) 1250 (500 Ca) MG tablet Take 1 tablet by mouth daily. (Patient not taking: No sig reported)     nitrofurantoin (MACRODANTIN) 50 MG capsule Take 1 capsule (50 mg total) by mouth at bedtime. For prophylaxis for bladder infection 90 capsule 0   potassium chloride (KLOR-CON) 10 MEQ tablet Take by mouth. (Patient not taking: No sig reported)     predniSONE (DELTASONE) 20 MG tablet Take 20 mg by mouth every morning.     senna-docusate (SENOKOT-S) 8.6-50 MG tablet Take 1 tablet by mouth daily. (Patient not taking: Reported on 09/25/2021)     No facility-administered medications prior to visit.    Allergies  Allergen Reactions   Latex Itching and Rash    ROS Review of Systems    Objective:    Physical Exam  BP 121/66   Pulse 96   Temp 98.1 F (36.7 C)   Ht 5\' 5"  (1.651 m)   Wt 165 lb (74.8 kg)   SpO2 100%   BMI 27.46 kg/m  Wt Readings from Last 3 Encounters:  10/01/21 165 lb (74.8 kg)  04/21/21 145 lb (65.8 kg)  03/06/21 145 lb (65.8 kg)     Health Maintenance Due  Topic Date Due   TETANUS/TDAP  Never done   Zoster Vaccines- Shingrix (1 of 2) Never done   COLONOSCOPY (Pts 45-58yrs Insurance coverage will need to be confirmed)  Never done   MAMMOGRAM  03/04/2019    There are no preventive care reminders to display for this patient.  Lab Results  Component Value Date   TSH 3.70 03/05/2020   Lab Results  Component Value Date   WBC 7.2 03/05/2020   HGB 13.1 03/05/2020   HCT 39.8 03/05/2020   MCV 88.6 03/05/2020   PLT 158 03/05/2020   Lab Results  Component Value Date   NA 139 04/22/2021   K 3.6 04/22/2021   CO2 24 04/22/2021   GLUCOSE 122 (H) 04/22/2021   BUN 21 04/22/2021   CREATININE 0.99 (H) 04/22/2021   BILITOT 0.3 04/22/2021   ALKPHOS 81 02/19/2017    AST 14 04/22/2021   ALT 9 04/22/2021   PROT 7.4 04/22/2021   ALBUMIN 4.2 02/19/2017   CALCIUM 9.4 04/22/2021   Lab Results  Component Value Date   CHOL 174 04/22/2021   Lab Results  Component Value Date  HDL 39 (L) 04/22/2021   Lab Results  Component Value Date   LDLCALC 108 (H) 04/22/2021   Lab Results  Component Value Date   TRIG 157 (H) 04/22/2021   Lab Results  Component Value Date   CHOLHDL 4.5 04/22/2021   Lab Results  Component Value Date   HGBA1C 5.0 08/04/2021      Assessment & Plan:   Problem List Items Addressed This Visit       Cardiovascular and Mediastinum   HYPERTENSION, BENIGN SYSTEMIC    Well controlled. Continue current regimen. Follow up in  6 mo         Hematopoietic and Hemostatic   Pancytopenia due to antineoplastic chemotherapy [facility])    Plan to recheck CBC today. didi receive 2 units PRB this month        Other   History of resection of meningioma - Primary    Incision looks great on exam and is healing well !!!  She is engaged in PT and has been doing well. Still unable to walk.        Other Visit Diagnoses     Cloudy urine       Relevant Orders   POCT URINALYSIS DIP (CLINITEK) (Completed)   Anemia, blood loss       Relevant Orders   CBC with Differential/Platelet   Fe+TIBC+Fer   Acute cystitis without hematuria       Relevant Orders   Urine Culture   Need for immunization against influenza       Relevant Orders   Flu Vaccine QUAD High Dose(Fluad) (Completed)   Type 2 diabetes mellitus with stage 2 decubitus ulcer of heel (HCC)       Pressure injury of sacral region, stage 2 ([place])           UTI - will tx with Cipro. She is starting to show resistance. Will send for culture.    Recurrent UTI - didn't do well with Macrobid prophylaxis. Consider alternatives.    Meds ordered this encounter  Medications   ciprofloxacin (CIPRO) 500 MG tablet    Sig: Take 1 tablet (500 mg total) by mouth 2 (two) times daily for  10 days.    Dispense:  10 tablet    Refill:  0   I spent 45 minutes on the day of the encounter to include pre-visit record review, face-to-face time with the patient and post visit ordering of test.    Follow-up: Return if symptoms worsen or fail to improve.    [name], MD

## 2021-10-01 NOTE — Assessment & Plan Note (Signed)
Incision looks great on exam and is healing well !!!  She is engaged in PT and has been doing well. Still unable to walk.

## 2021-10-01 NOTE — Assessment & Plan Note (Signed)
Well controlled. Continue current regimen. Follow up in  6 mo  

## 2021-10-02 LAB — CBC WITH DIFFERENTIAL/PLATELET
Absolute Monocytes: 536 cells/uL (ref 200–950)
Basophils Absolute: 38 cells/uL (ref 0–200)
Basophils Relative: 0.6 %
Eosinophils Absolute: 189 cells/uL (ref 15–500)
Eosinophils Relative: 3 %
HCT: 36.6 % (ref 35.0–45.0)
Hemoglobin: 12 g/dL (ref 11.7–15.5)
Lymphs Abs: 1292 cells/uL (ref 850–3900)
MCH: 30.2 pg (ref 27.0–33.0)
MCHC: 32.8 g/dL (ref 32.0–36.0)
MCV: 92.2 fL (ref 80.0–100.0)
MPV: 10.5 fL (ref 7.5–12.5)
Monocytes Relative: 8.5 %
Neutro Abs: 4246 cells/uL (ref 1500–7800)
Neutrophils Relative %: 67.4 %
Platelets: 178 10*3/uL (ref 140–400)
RBC: 3.97 10*6/uL (ref 3.80–5.10)
RDW: 18 % — ABNORMAL HIGH (ref 11.0–15.0)
Total Lymphocyte: 20.5 %
WBC: 6.3 10*3/uL (ref 3.8–10.8)

## 2021-10-02 LAB — IRON,TIBC AND FERRITIN PANEL
%SAT: 18 % (calc) (ref 16–45)
Ferritin: 119 ng/mL (ref 16–288)
Iron: 46 ug/dL (ref 45–160)
TIBC: 259 mcg/dL (calc) (ref 250–450)

## 2021-10-02 NOTE — Progress Notes (Signed)
Iron looks good as well.  Urine culture still pending.

## 2021-10-04 LAB — URINE CULTURE
MICRO NUMBER:: 12497192
SPECIMEN QUALITY:: ADEQUATE

## 2021-10-06 MED ORDER — NITROFURANTOIN MONOHYD MACRO 100 MG PO CAPS: 100.0000 mg | ORAL_CAPSULE | Freq: Two times a day (BID) | ORAL | 0 refills | Status: DC

## 2021-10-06 NOTE — Addendum Note (Signed)
Addended by: [name] D on: 10/06/2021 04:47 PM   Modules accepted: Orders

## 2021-10-06 NOTE — Progress Notes (Signed)
Urine culture grew out Enterococcus and E. coli.  It shows that it sensitive to pretty much every medication which is great.  So the Cipro should have cleared up the E. coli.  But again I have to put her on Macrobid to cover for the Enterococcus. New rx sent to pharmacy

## 2021-10-07 DIAGNOSIS — M6281 Muscle weakness (generalized): Secondary | ICD-10-CM | POA: Diagnosis not present

## 2021-10-07 DIAGNOSIS — R2689 Other abnormalities of gait and mobility: Secondary | ICD-10-CM | POA: Diagnosis not present

## 2021-10-07 DIAGNOSIS — Z4789 Encounter for other orthopedic aftercare: Secondary | ICD-10-CM | POA: Diagnosis not present

## 2021-10-07 DIAGNOSIS — M5384 Other specified dorsopathies, thoracic region: Secondary | ICD-10-CM | POA: Diagnosis not present

## 2021-10-08 DIAGNOSIS — M5384 Other specified dorsopathies, thoracic region: Secondary | ICD-10-CM | POA: Diagnosis not present

## 2021-10-08 DIAGNOSIS — Z4789 Encounter for other orthopedic aftercare: Secondary | ICD-10-CM | POA: Diagnosis not present

## 2021-10-08 DIAGNOSIS — M6281 Muscle weakness (generalized): Secondary | ICD-10-CM | POA: Diagnosis not present

## 2021-10-08 DIAGNOSIS — R2689 Other abnormalities of gait and mobility: Secondary | ICD-10-CM | POA: Diagnosis not present

## 2021-10-13 ENCOUNTER — Encounter: Payer: Self-pay | Admitting: Family Medicine

## 2021-10-13 MED ORDER — CAPSAICIN 0.05 % EX CREA: 1.0000 "application " | TOPICAL_CREAM | Freq: Two times a day (BID) | CUTANEOUS | 2 refills | Status: DC | PRN

## 2021-10-13 NOTE — Telephone Encounter (Signed)
Meds ordered this encounter  Medications   Capsaicin 0.05 % CREA    Sig: Apply 1 application topically 2 (two) times daily as needed.    Dispense:  57 g    Refill:  2

## 2021-10-14 DIAGNOSIS — Z4789 Encounter for other orthopedic aftercare: Secondary | ICD-10-CM | POA: Diagnosis not present

## 2021-10-14 DIAGNOSIS — R2689 Other abnormalities of gait and mobility: Secondary | ICD-10-CM | POA: Diagnosis not present

## 2021-10-14 DIAGNOSIS — M6281 Muscle weakness (generalized): Secondary | ICD-10-CM | POA: Diagnosis not present

## 2021-10-14 DIAGNOSIS — M5384 Other specified dorsopathies, thoracic region: Secondary | ICD-10-CM | POA: Diagnosis not present

## 2021-10-16 DIAGNOSIS — R2689 Other abnormalities of gait and mobility: Secondary | ICD-10-CM | POA: Diagnosis not present

## 2021-10-16 DIAGNOSIS — Z4789 Encounter for other orthopedic aftercare: Secondary | ICD-10-CM | POA: Diagnosis not present

## 2021-10-16 DIAGNOSIS — M5384 Other specified dorsopathies, thoracic region: Secondary | ICD-10-CM | POA: Diagnosis not present

## 2021-10-16 DIAGNOSIS — M6281 Muscle weakness (generalized): Secondary | ICD-10-CM | POA: Diagnosis not present

## 2021-10-16 DIAGNOSIS — S32000A Wedge compression fracture of unspecified lumbar vertebra, initial encounter for closed fracture: Secondary | ICD-10-CM | POA: Diagnosis not present

## 2021-10-20 DIAGNOSIS — F339 Major depressive disorder, recurrent, unspecified: Secondary | ICD-10-CM

## 2021-10-20 DIAGNOSIS — M792 Neuralgia and neuritis, unspecified: Secondary | ICD-10-CM

## 2021-10-21 DIAGNOSIS — M6281 Muscle weakness (generalized): Secondary | ICD-10-CM | POA: Diagnosis not present

## 2021-10-21 DIAGNOSIS — M546 Pain in thoracic spine: Secondary | ICD-10-CM | POA: Diagnosis not present

## 2021-10-21 DIAGNOSIS — Z4789 Encounter for other orthopedic aftercare: Secondary | ICD-10-CM | POA: Diagnosis not present

## 2021-10-28 ENCOUNTER — Encounter: Payer: Self-pay | Admitting: Family Medicine

## 2021-10-28 DIAGNOSIS — Z4789 Encounter for other orthopedic aftercare: Secondary | ICD-10-CM | POA: Diagnosis not present

## 2021-10-28 DIAGNOSIS — M546 Pain in thoracic spine: Secondary | ICD-10-CM | POA: Diagnosis not present

## 2021-10-28 DIAGNOSIS — C541 Malignant neoplasm of endometrium: Secondary | ICD-10-CM | POA: Diagnosis not present

## 2021-10-28 DIAGNOSIS — M6281 Muscle weakness (generalized): Secondary | ICD-10-CM | POA: Diagnosis not present

## 2021-10-29 ENCOUNTER — Telehealth: Payer: Medicare HMO | Admitting: Physician Assistant

## 2021-10-29 ENCOUNTER — Other Ambulatory Visit: Payer: Self-pay

## 2021-10-29 DIAGNOSIS — R300 Dysuria: Secondary | ICD-10-CM | POA: Diagnosis not present

## 2021-10-29 DIAGNOSIS — N3000 Acute cystitis without hematuria: Secondary | ICD-10-CM | POA: Diagnosis not present

## 2021-10-29 MED ORDER — CEPHALEXIN 500 MG PO CAPS: 500.0000 mg | ORAL_CAPSULE | Freq: Two times a day (BID) | ORAL | 0 refills | Status: AC

## 2021-10-29 NOTE — Telephone Encounter (Signed)
Spoke to pt who will have someone drop off a specimen today to send for culture.  Charyl Bigger, CMA

## 2021-10-29 NOTE — Telephone Encounter (Signed)
Yes if we can still get a sample that would be great.  We can just straight send it for culture we do not need to run a regular urinalysis on it since she is already being treated but that went because she does not get better we already have it processing to see if she might have something resistant.

## 2021-10-29 NOTE — Patient Instructions (Signed)
Amy Wilson, thank you for joining Leeanne Rio, PA-C for today's virtual visit.  While this provider is not your primary care provider (PCP), if your PCP is located in our provider database this encounter information will be shared with them immediately following your visit.  Consent: (Patient) Amy Wilson provided verbal consent for this virtual visit at the beginning of the encounter.  Current Medications:  Current Outpatient Medications:    Capsaicin 0.05 % CREA, Apply 1 application topically 2 (two) times daily as needed., Disp: 57 g, Rfl: 2   Cholecalciferol 25 MCG (1000 UT) capsule, Take 1 capsule by mouth daily., Disp: , Rfl:    esomeprazole (NEXIUM) 20 MG capsule, Take 10 mg by mouth daily before breakfast. Take 10 mg 30 minutes before breakfast, Disp: , Rfl:    gabapentin (NEURONTIN) 300 MG capsule, Take 300 mg by mouth in the morning, at noon, and at bedtime., Disp: , Rfl:    magnesium oxide (MAG-OX) 400 MG tablet, Take by mouth., Disp: , Rfl:    nitrofurantoin, macrocrystal-monohydrate, (MACROBID) 100 MG capsule, Take 1 capsule (100 mg total) by mouth 2 (two) times daily., Disp: 14 capsule, Rfl: 0   ondansetron (ZOFRAN) 8 MG tablet, Take by mouth., Disp: , Rfl:    oxybutynin (DITROPAN) 5 MG tablet, Take 1 tablet by mouth twice daily, Disp: 60 tablet, Rfl: 5   PARoxetine (PAXIL) 20 MG tablet, Take 1 tablet (20 mg total) by mouth daily., Disp: 90 tablet, Rfl: 1   tamsulosin (FLOMAX) 0.4 MG CAPS capsule, , Disp: , Rfl:    Medications ordered in this encounter:  No orders of the defined types were placed in this encounter.    *If you need refills on other medications prior to your next appointment, please contact your pharmacy*  Follow-Up: Call back or seek an in-person evaluation if the symptoms worsen or if the condition fails to improve as anticipated.  Other Instructions Your symptoms are consistent with a bladder infection, also called acute cystitis. Please  take your antibiotic (Keflex) as directed until all pills are gone.  Stay very well hydrated.  Consider a daily probiotic (Align, Culturelle, or Activia) to help prevent stomach upset caused by the antibiotic.  Taking a probiotic daily may also help prevent recurrent UTIs.  Also consider taking AZO (Phenazopyridine) tablets to help decrease pain with urination.    Contact PCP office as soon as they open to get set up to take urine sample in for culture (per Dr. Morrie Sheldon message in chart). If she still wants to see you via video or in-office despite having this video this morning, you need to attend.  Urinary Tract Infection A urinary tract infection (UTI) can occur any place along the urinary tract. The tract includes the kidneys, ureters, bladder, and urethra. A type of germ called bacteria often causes a UTI. UTIs are often helped with antibiotic medicine.  HOME CARE  If given, take antibiotics as told by your doctor. Finish them even if you start to feel better. Drink enough fluids to keep your pee (urine) clear or pale yellow. Avoid tea, drinks with caffeine, and bubbly (carbonated) drinks. Pee often. Avoid holding your pee in for a long time. Pee before and after having sex (intercourse). Wipe from front to back after you poop (bowel movement) if you are a woman. Use each tissue only once. GET HELP RIGHT AWAY IF:  You have back pain. You have lower belly (abdominal) pain. You have chills. You feel sick  to your stomach (nauseous). You throw up (vomit). Your burning or discomfort with peeing does not go away. You have a fever. Your symptoms are not better in 3 days. MAKE SURE YOU:  Understand these instructions. Will watch your condition. Will get help right away if you are not doing well or get worse. Document Released: 05/25/2008 Document Revised: 08/31/2012 Document Reviewed: 07/07/2012 [facility] Patient Information 2015 [place], [place]. This information is not intended to replace  advice given to you by your health care provider. Make sure you discuss any questions you have with your health care provider.    If you have been instructed to have an in-person evaluation today at a local Urgent Care facility, please use the link below. It will take you to a list of all of our available [facility], including address, phone number and hours of operation. Please do not delay care.  [facility]  If you or a family member do not have a primary care provider, use the link below to schedule a visit and establish care. When you choose a [facility] primary care physician or advanced practice provider, you gain a long-term partner in health. Find a Primary Care Provider  Learn more about [place]'s in-office and virtual care options: [facility]

## 2021-10-29 NOTE — Progress Notes (Signed)
Virtual Visit Consent   Amy Wilson, you are scheduled for a virtual visit with a [facility] provider today.     Just as with appointments in the office, your consent must be obtained to participate.  Your consent will be active for this visit and any virtual visit you may have with one of our providers in the next 365 days.     If you have a MyChart account, a copy of this consent can be sent to you electronically.  All virtual visits are billed to your insurance company just like a traditional visit in the office.    As this is a virtual visit, video technology does not allow for your provider to perform a traditional examination.  This may limit your provider's ability to fully assess your condition.  If your provider identifies any concerns that need to be evaluated in person or the need to arrange testing (such as labs, EKG, etc.), we will make arrangements to do so.     Although advances in technology are sophisticated, we cannot ensure that it will always work on either your end or our end.  If the connection with a video visit is poor, the visit may have to be switched to a telephone visit.  With either a video or telephone visit, we are not always able to ensure that we have a secure connection.     I need to obtain your verbal consent now.   Are you willing to proceed with your visit today?    Amy Wilson has provided verbal consent on 10/29/2021 for a virtual visit (video or telephone).   Leeanne Rio, [place]   Date: 10/29/2021 8:02 AM   Virtual Visit via Video Note   I, Leeanne Rio, connected with  Amy Wilson  ([id number], 12-10-1951) on 10/29/21 at  7:45 AM EST by a video-enabled telemedicine application and verified that I am speaking with the correct person using two identifiers.  Location: Patient: Virtual Visit Location Patient: Home Provider: Virtual Visit Location Provider: Home Office   I discussed the limitations of evaluation and management  by telemedicine and the availability of in person appointments. The patient expressed understanding and agreed to proceed.    History of Present Illness: Amy Wilson is a 70 y.o. who identifies as a female who was assigned female at birth, and is being seen today for possible UTI. Patient is accompanied by her son-in-law who is one of her primary caregivers. Patient with history of recurrent UTI and UTI-related sepsis. Has had recurrence of urinary symptoms over the past couple of days with frequency, urgency and dysuria. Denies fever, chills, N/V, back pain, flank pain or abdominal pain. Denies AMS. They have reached out to PCP but are waiting on a response. Son-in-law is worried as they do not like to wait too long to get her treated.  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   Left leg weakness 06/02/2021   Recurrent UTI 11/27/2020   Neutropenia associated with infection ([place]) 11/23/2020   Mixed stress and urge urinary incontinence 10/22/2020   Generalized weakness 10/22/2020   Pancytopenia due to antineoplastic chemotherapy ([place]) 10/13/2020   Cerebellar ataxia in diseases classified elsewhere ([place]) 08/08/2020   Retroperitoneal lymphadenopathy 08/08/2020   Peripheral neuropathic pain 07/22/2020   GERD (gastroesophageal reflux disease) 07/03/2020   Stage 3b chronic kidney disease ([place])  07/03/2020   Bilateral ureteral obstruction 05/27/2020   Hydronephrosis 05/18/2019   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[place]) 05/03/2019   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[place]) 05/03/2019   Endometrial adenocarcinoma ([place]) 03/03/2019   Closed compression fracture of body of L1 vertebra ([place]) 02/24/2019   Age-related osteoporosis with current pathological fracture with routine healing 02/24/2019   Moderate to severe pulmonary hypertension ([place]) 02/21/2019   Pelvic mass  02/20/2019   Hiatal hernia 02/20/2019   Fibroid uterus 02/20/2019   Bladder wall thickening 02/20/2019   Aortic atherosclerosis ([place]) 02/20/2019   Obesity (BMI 30-39.9) 10/05/2014   ARM PAIN, LEFT 10/13/2010   INSOMNIA 05/13/2009   DIZZINESS 09/13/2007   Hyperlipidemia 08/19/2007   Depression, recurrent ([place]) 09/28/2006   HYPERTENSION, BENIGN SYSTEMIC 09/28/2006   MENOPAUSAL SYNDROME 09/28/2006   VASOVAGAL SYNCOPE 09/28/2006   FATIGUE/MALAISE 09/28/2006    Allergies:  Allergies  Allergen Reactions   Latex Itching and Rash   Medications:  Current Outpatient Medications:    cephALEXin (KEFLEX) 500 MG capsule, Take 1 capsule (500 mg total) by mouth 2 (two) times daily for 7 days., Disp: 14 capsule, Rfl: 0   Capsaicin 0.05 % CREA, Apply 1 application topically 2 (two) times daily as needed., Disp: 57 g, Rfl: 2   Cholecalciferol 25 MCG (1000 UT) capsule, Take 1 capsule by mouth daily., Disp: , Rfl:    esomeprazole (NEXIUM) 20 MG capsule, Take 10 mg by mouth daily before breakfast. Take 10 mg 30 minutes before breakfast, Disp: , Rfl:    gabapentin (NEURONTIN) 300 MG capsule, Take 300 mg by mouth in the morning, at noon, and at bedtime., Disp: , Rfl:    magnesium oxide (MAG-OX) 400 MG tablet, Take by mouth., Disp: , Rfl:    ondansetron (ZOFRAN) 8 MG tablet, Take by mouth., Disp: , Rfl:    oxybutynin (DITROPAN) 5 MG tablet, Take 1 tablet by mouth twice daily, Disp: 60 tablet, Rfl: 5   PARoxetine (PAXIL) 20 MG tablet, Take 1 tablet (20 mg total) by mouth daily., Disp: 90 tablet, Rfl: 1   tamsulosin (FLOMAX) 0.4 MG CAPS capsule, , Disp: , Rfl:   Observations/Objective: Patient is well-developed, well-nourished in no acute distress.  Resting comfortably at home.  Head is normocephalic, atraumatic.  No labored breathing. Speech is clear and coherent with logical content.  Patient is alert and oriented at baseline.   Assessment and Plan: 1. Acute cystitis without hematuria - cephALEXin  (KEFLEX) 500 MG capsule; Take 1 capsule (500 mg total) by mouth 2 (two) times daily for 7 days.  Dispense: 14 capsule; Refill: 0 No alarm signs/symptoms present. Giving history and age she does need to give urine for UA/culture. Caregiver with sterile cups at home due to her history and has just obtained urine sample he will keep refrigerated. On chart review seems PCP has responded to message and wanted them to bring in sample for repeat culture. Caregiver to call their office as soon as they open to get this set up and taken care of. Giving his and patient's concerns along with history, will start Keflex 500 mg BID until culture results come in. Discussed that PCP may still want to see them even though they are proceeding with culture and if that is requested, they should attend appointment with PCP. Will send message directly to PCP regarding this. Strict ER precautions reviewed.   Follow Up Instructions: I discussed the assessment and treatment plan with the patient. The patient was provided an opportunity to  ask questions and all were answered. The patient agreed with the plan and demonstrated an understanding of the instructions.  A copy of instructions were sent to the patient via MyChart unless otherwise noted below.   The patient was advised to call back or seek an in-person evaluation if the symptoms worsen or if the condition fails to improve as anticipated.  Time:  I spent 12 minutes with the patient via telehealth technology discussing the above problems/concerns.    Leeanne Rio, PA-C

## 2021-10-31 LAB — CULTURE, URINE COMPREHENSIVE
MICRO NUMBER:: 12618255
SPECIMEN QUALITY:: ADEQUATE

## 2021-11-03 NOTE — Progress Notes (Signed)
Still awaiting the sensitivities on the culture.

## 2021-11-06 ENCOUNTER — Other Ambulatory Visit: Payer: Self-pay | Admitting: Family Medicine

## 2021-11-06 DIAGNOSIS — F339 Major depressive disorder, recurrent, unspecified: Secondary | ICD-10-CM

## 2021-11-10 ENCOUNTER — Encounter: Payer: Self-pay | Admitting: Family Medicine

## 2021-11-10 NOTE — Telephone Encounter (Signed)
If still having sxs then needs repeat urine clean culture.  Last one just showed skin bacteria.

## 2021-11-11 ENCOUNTER — Other Ambulatory Visit: Payer: Self-pay | Admitting: *Deleted

## 2021-11-11 DIAGNOSIS — R8290 Unspecified abnormal findings in urine: Secondary | ICD-10-CM

## 2021-11-11 DIAGNOSIS — R300 Dysuria: Secondary | ICD-10-CM | POA: Diagnosis not present

## 2021-11-15 LAB — URINE CULTURE
MICRO NUMBER:: 12672901
SPECIMEN QUALITY:: ADEQUATE

## 2021-11-17 ENCOUNTER — Other Ambulatory Visit: Payer: Self-pay | Admitting: Family Medicine

## 2021-11-17 MED ORDER — CIPROFLOXACIN HCL 500 MG PO TABS: 500.0000 mg | ORAL_TABLET | Freq: Two times a day (BID) | ORAL | 0 refills | Status: AC

## 2021-11-17 NOTE — Progress Notes (Signed)
Meds ordered this encounter  Medications   ciprofloxacin (CIPRO) 500 MG tablet    Sig: Take 1 tablet (500 mg total) by mouth 2 (two) times daily for 5 days.    Dispense:  10 tablet    Refill:  0

## 2021-11-17 NOTE — Progress Notes (Signed)
Hi Berta, urine culture grew out a bacteria called Pseudomonas.  New prescription sent to pharmacy for new antibiotic.  Let me know if you are still having any problems after you complete the antibiotic.

## 2021-11-25 ENCOUNTER — Encounter: Payer: Self-pay | Admitting: Family Medicine

## 2021-11-27 ENCOUNTER — Other Ambulatory Visit: Payer: Self-pay

## 2021-11-27 ENCOUNTER — Telehealth: Payer: Self-pay | Admitting: *Deleted

## 2021-11-27 MED ORDER — ESOMEPRAZOLE MAGNESIUM 20 MG PO CPDR: 20.0000 mg | DELAYED_RELEASE_CAPSULE | Freq: Every day | ORAL | 0 refills | Status: DC

## 2021-11-27 NOTE — Chronic Care Management (AMB) (Signed)
  Care Management   Note  11/27/2021 Name: BINA VEENSTRA MRN: [id number] DOB: 1951/09/07  Amy Wilson is a 70 y.o. year old female who is a primary care patient of Hali Marry, MD and is actively engaged with the care management team. I reached out to Amy Wilson by phone today to assist with re-scheduling a follow up visit with the RN Case Manager  Follow up plan: Unsuccessful telephone outreach attempt made. A HIPAA compliant phone message was left for the patient providing contact information and requesting a return call.   Julian Hy, [place] Management  Direct Dial: [phone]

## 2021-11-28 ENCOUNTER — Telehealth: Payer: Self-pay

## 2021-11-28 MED ORDER — ESOMEPRAZOLE MAGNESIUM 20 MG PO CPDR: 20.0000 mg | DELAYED_RELEASE_CAPSULE | Freq: Every day | ORAL | 1 refills | Status: DC

## 2021-11-28 NOTE — Telephone Encounter (Signed)
Resent rx with correct sig

## 2021-11-28 NOTE — Telephone Encounter (Signed)
[facility] called and left a message stating the esomeprazole has 2 different directions. She she take 20 mg daily or 10 mg daily. Please advise.

## 2021-12-04 DIAGNOSIS — N132 Hydronephrosis with renal and ureteral calculous obstruction: Secondary | ICD-10-CM | POA: Diagnosis not present

## 2021-12-04 DIAGNOSIS — N390 Urinary tract infection, site not specified: Secondary | ICD-10-CM | POA: Diagnosis not present

## 2021-12-04 DIAGNOSIS — R399 Unspecified symptoms and signs involving the genitourinary system: Secondary | ICD-10-CM | POA: Diagnosis not present

## 2021-12-05 NOTE — Chronic Care Management (AMB) (Signed)
°  Care Management   Note  12/05/2021 Name: Amy Wilson MRN: [id number] DOB: 24-Sep-1951  Amy Wilson is a 70 y.o. year old female who is a primary care patient of Hali Marry, MD and is actively engaged with the care management team. I reached out to Amy Wilson by phone today to assist with re-scheduling a follow up visit with the RN Case Manager  Follow up plan: Telephone appointment with care management team member scheduled for: 01/14/2022  Julian Hy, [place], [place] Management  Direct Dial: [phone]

## 2021-12-08 ENCOUNTER — Other Ambulatory Visit: Payer: Self-pay | Admitting: *Deleted

## 2021-12-19 DIAGNOSIS — R399 Unspecified symptoms and signs involving the genitourinary system: Secondary | ICD-10-CM | POA: Diagnosis not present

## 2022-01-01 DIAGNOSIS — N1339 Other hydronephrosis: Secondary | ICD-10-CM | POA: Diagnosis not present

## 2022-01-01 DIAGNOSIS — N135 Crossing vessel and stricture of ureter without hydronephrosis: Secondary | ICD-10-CM | POA: Diagnosis not present

## 2022-01-01 DIAGNOSIS — I10 Essential (primary) hypertension: Secondary | ICD-10-CM | POA: Diagnosis not present

## 2022-01-01 DIAGNOSIS — Z923 Personal history of irradiation: Secondary | ICD-10-CM | POA: Diagnosis not present

## 2022-01-01 DIAGNOSIS — Z8542 Personal history of malignant neoplasm of other parts of uterus: Secondary | ICD-10-CM | POA: Diagnosis not present

## 2022-01-01 DIAGNOSIS — Z8589 Personal history of malignant neoplasm of other organs and systems: Secondary | ICD-10-CM | POA: Diagnosis not present

## 2022-01-01 DIAGNOSIS — K219 Gastro-esophageal reflux disease without esophagitis: Secondary | ICD-10-CM | POA: Diagnosis not present

## 2022-01-01 DIAGNOSIS — Z79899 Other long term (current) drug therapy: Secondary | ICD-10-CM | POA: Diagnosis not present

## 2022-01-01 DIAGNOSIS — Z9221 Personal history of antineoplastic chemotherapy: Secondary | ICD-10-CM | POA: Diagnosis not present

## 2022-01-01 DIAGNOSIS — Z90710 Acquired absence of both cervix and uterus: Secondary | ICD-10-CM | POA: Diagnosis not present

## 2022-01-08 ENCOUNTER — Other Ambulatory Visit: Payer: Self-pay

## 2022-01-08 ENCOUNTER — Ambulatory Visit (INDEPENDENT_AMBULATORY_CARE_PROVIDER_SITE_OTHER): Payer: Medicare HMO | Admitting: Pharmacist

## 2022-01-08 DIAGNOSIS — I10 Essential (primary) hypertension: Secondary | ICD-10-CM

## 2022-01-08 DIAGNOSIS — M792 Neuralgia and neuritis, unspecified: Secondary | ICD-10-CM

## 2022-01-08 DIAGNOSIS — F339 Major depressive disorder, recurrent, unspecified: Secondary | ICD-10-CM

## 2022-01-08 NOTE — Progress Notes (Signed)
Chronic Care Management Pharmacy Note  01/12/2022 Name:  Amy Wilson MRN:  [id number] DOB:  [birth year]  Summary: addressed HTN, mental health, chronic pain. Patient has a uretal stent and recently the frequency for changing has been adjusted to Q10month, which she is pleased with in order to minimize UTIs.  Patient self-discontinued paxil for past several weeks, prefers going without it.  She is taking gabapentin 6051mAM, 60051mfternoon, and 900m56m bedtime and feels it is not adequately effective.  Recommendations/Changes made from today's visit:  May consider trying lyrica as patients sometimes have different responses to gabapentin vs lyrica & vice versa. If desire to trial this change, consider lyrica 300mg24m 300mg 5mrnoon, and 600mg i39me evening.  Plan: f/u with pharmacist in 6-8  months  Subjective: Amy ROSELIN WIEMANN70 y.o.41ear old female who is a primary patient of Metheney, CatheriRene KocherThe CCM team was consulted for assistance with disease management and care coordination needs.    Engaged with patient by telephone for follow up visit in response to provider referral for pharmacy case management and/or care coordination services.   Consent to Services:  The patient was given information about Chronic Care Management services, agreed to services, and gave verbal consent prior to initiation of services.  Please see initial visit note for detailed documentation.   Patient Care Team: MetheneHali Marry PCP - General WallaceLuretha Rued Case Manager Surabhi Gadea, [facility] (Pharmacist)  Recent office visits: 08/04/21 - Dr MetheneMadilyn Fireman pneumococcal vaccine, HTN f/u, meningeal tumor/pain.   Recent consult visits: 08/14/21 - Joanna Nat Christenocial worker), assisting through journey/placement needs after upcoming surgery  08/06/21 - Juana WThea SilversmithMgr), community resource needs  Hospital visits: None in previous 6  months  Objective:  Lab Results  Component Value Date   CREATININE 0.99 (H) 04/22/2021   CREATININE 0.97 (H) 03/05/2020   CREATININE 0.87 05/20/2018    Lab Results  Component Value Date   HGBA1C 5.0 08/04/2021   Last diabetic Eye exam: No results found for: HMDIABEYEEXA  Last diabetic Foot exam: No results found for: HMDIABFOOTEX      Component Value Date/Time   CHOL 174 04/22/2021 1535   TRIG 157 (H) 04/22/2021 1535   HDL 39 (L) 04/22/2021 1535   CHOLHDL 4.5 04/22/2021 1535   LDLCALC 108 (H) 04/22/2021 1535    Hepatic Function Latest Ref Rng & Units 04/22/2021 03/05/2020 05/20/2018  Total Protein 6.1 - 8.1 g/dL 7.4 7.2 7.7  Albumin 3.6 - 5.1 g/dL - - -  AST 10 - 35 U/L '14 16 23  ' ALT 6 - 29 U/L '9 14 18  ' Alk Phosphatase 33 - 130 U/L - - -  Total Bilirubin 0.2 - 1.2 mg/dL 0.3 0.5 0.5    Lab Results  Component Value Date/Time   TSH 3.70 03/05/2020 12:58 PM   TSH 4.24 05/20/2018 10:19 AM    CBC Latest Ref Rng & Units 10/01/2021 03/05/2020 02/23/2017  WBC 3.8 - 10.8 Thousand/uL 6.3 7.2 5.2  Hemoglobin 11.7 - 15.5 g/dL 12.0 13.1 15.1  Hematocrit 35.0 - 45.0 % 36.6 39.8 45.1(H)  Platelets 140 - 400 Thousand/uL 178 158 200    Lab Results  Component Value Date/Time   VD25OH 38 04/22/2021 03:35 PM    Social History   Tobacco Use  Smoking Status Never  Smokeless Tobacco Never   BP Readings from Last 3 Encounters:  10/01/21 121/66  08/04/21 110/63  04/21/21 120/60   Pulse Readings from Last 3 Encounters:  10/01/21 96  08/04/21 85  04/21/21 (!) 113   Wt Readings from Last 3 Encounters:  10/01/21 165 lb (74.8 kg)  04/21/21 145 lb (65.8 kg)  03/06/21 145 lb (65.8 kg)    Assessment: Review of patient past medical history, allergies, medications, health status, including review of consultants reports, laboratory and other test data, was performed as part of comprehensive evaluation and provision of chronic care management services.   SDOH:  (Social Determinants  of Health) assessments and interventions performed:    CCM Care Plan  Allergies  Allergen Reactions   Latex Itching and Rash    Medications Reviewed Today     Reviewed by Darius Bump, [facility] (Pharmacist) on 01/12/22 at 1903  Med List Status: <None>   Medication Order Taking? Sig Documenting Provider Last Dose Status Informant  Cholecalciferol 25 MCG (1000 UT) capsule [id number] Yes Take 1 capsule by mouth daily. [provider] Taking Active   cyclobenzaprine (FLEXERIL) 10 MG tablet [id number] Yes Take 10 mg by mouth 3 (three) times daily as needed. [provider] Taking Active   esomeprazole (NEXIUM) 20 MG capsule [id number] Yes Take 1 capsule (20 mg total) by mouth daily before breakfast. Take 30 minutes before breakfast Hali Marry, MD Taking Active   gabapentin (NEURONTIN) 300 MG capsule [id number] Yes Take 300 mg by mouth in the morning, at noon, and at bedtime. [provider] Taking Active            Med Note Tonny Bollman Sep 25, 2021  1:41 PM)    gabapentin (NEURONTIN) 400 MG capsule [id number] Yes Take 400 mg by mouth at bedtime. [provider] Taking Active            Med Note Rikki Spearing Jan 12, 2022  7:02 PM) Patient takes 2 capsules AM, 2 capsules in afternoon, and 3 capsules in the evening as of 01/08/22 review.   HYDROcodone-acetaminophen (NORCO/VICODIN) 5-325 MG tablet [id number] Yes Take 1 tablet by mouth every 6 (six) hours as needed. [provider] Taking Active   magnesium oxide (MAG-OX) 400 (240 Mg) MG tablet [id number] Yes Take 1 tablet by mouth daily. [provider] Taking Active   magnesium oxide (MAG-OX) 400 MG tablet [id number] Yes Take by mouth. [provider] Taking Active   nitrofurantoin (MACRODANTIN) 50 MG capsule [id number] Yes Take 50 mg by mouth daily. [provider] Taking Active   ondansetron (ZOFRAN) 8 MG tablet [id number] Yes Take by mouth. 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   Left leg weakness 06/02/2021   Recurrent UTI 11/27/2020   Neutropenia associated with infection ([place]) 11/23/2020   Mixed stress and urge urinary incontinence 10/22/2020   Generalized weakness 10/22/2020   Pancytopenia due to antineoplastic chemotherapy ([place])  10/13/2020   Cerebellar ataxia in diseases classified elsewhere ([place]) 08/08/2020   Retroperitoneal lymphadenopathy 08/08/2020   Peripheral neuropathic pain 07/22/2020   GERD (gastroesophageal reflux disease) 07/03/2020   Stage 3b chronic kidney disease ([place]) 07/03/2020   Bilateral ureteral obstruction 05/27/2020   Hydronephrosis 05/18/2019   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[place]) 05/03/2019   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[place]) 05/03/2019   Endometrial adenocarcinoma ([place]) 03/03/2019   Closed compression fracture of body of L1 vertebra ([place]) 02/24/2019   Age-related osteoporosis with current pathological fracture with routine healing 02/24/2019   Moderate to severe pulmonary hypertension ([place]) 02/21/2019   Pelvic mass 02/20/2019   Hiatal hernia 02/20/2019   Fibroid uterus 02/20/2019   Bladder wall thickening 02/20/2019   Aortic atherosclerosis ([place]) 02/20/2019   Obesity (BMI 30-39.9) 10/05/2014   ARM PAIN, LEFT 10/13/2010   INSOMNIA 05/13/2009   DIZZINESS 09/13/2007   Hyperlipidemia 08/19/2007   Depression, recurrent ([place]) 09/28/2006   HYPERTENSION, BENIGN SYSTEMIC 09/28/2006   MENOPAUSAL SYNDROME  09/28/2006   VASOVAGAL SYNCOPE 09/28/2006   FATIGUE/MALAISE 09/28/2006    Immunization History  Administered Date(s) Administered   Fluad Quad(high Dose 65+) 09/11/2019, 10/01/2021   Influenza, High Dose Seasonal PF 03/09/2019   Influenza,inj,Quad PF,6+ Mos 10/16/2020   PNEUMOCOCCAL CONJUGATE-20 08/04/2021   Pneumococcal Polysaccharide-23 03/09/2019    Conditions to be addressed/monitored: HTN, Depression, and Chronic Pain  There are no care plans that you recently modified to display for this patient.    Medication Assistance: None required.  Patient affirms current coverage meets needs.  Patient's preferred pharmacy is:  [place], [place] [facility] [address] [place] [address] Phone: [phone] Fax: [phone]   Uses pill box? No - daughter helps with medicines Pt endorses 100% compliance  Follow Up:  Patient agrees to Care Plan and Follow-up.  Plan: Telephone follow up appointment with care management team member scheduled for:  6-8 months  Larinda Buttery, PharmD Clinical Pharmacist [facility] [phone]

## 2022-01-09 ENCOUNTER — Encounter: Payer: Self-pay | Admitting: Family Medicine

## 2022-01-09 ENCOUNTER — Ambulatory Visit (INDEPENDENT_AMBULATORY_CARE_PROVIDER_SITE_OTHER): Payer: Medicare HMO | Admitting: Family Medicine

## 2022-01-09 DIAGNOSIS — Z0000 Encounter for general adult medical examination without abnormal findings: Secondary | ICD-10-CM

## 2022-01-09 NOTE — Patient Instructions (Addendum)
[place] Maintenance Summary and Written Plan of Care  Ms. Amy Wilson ,  Thank you for allowing me to perform your Medicare Annual Wellness Visit and for your ongoing commitment to your health.   Health Maintenance & Immunization History Health Maintenance  Topic Date Due   URINE MICROALBUMIN  02/09/2022 (Originally 05/27/1961)   Zoster Vaccines- Shingrix (1 of 2) 04/09/2022 (Originally 05/26/1968)   MAMMOGRAM  01/09/2023 (Originally 03/04/2019)   COLONOSCOPY (Pts 45-42yrs Insurance coverage will need to be confirmed)  01/09/2023 (Originally 05/26/1994)   TETANUS/TDAP  01/09/2023 (Originally 05/26/1968)   Pneumonia Vaccine 98+ Years old  Completed   INFLUENZA VACCINE  Completed   DEXA SCAN  Completed   Hepatitis C Screening  Completed   HPV VACCINES  Aged Out   COVID-19 Vaccine  Discontinued   Immunization History  Administered Date(s) Administered   Fluad Quad(high Dose 65+) 09/11/2019, 10/01/2021   Influenza, High Dose Seasonal PF 03/09/2019   Influenza,inj,Quad PF,6+ Mos 10/16/2020   PNEUMOCOCCAL CONJUGATE-20 08/04/2021   Pneumococcal Polysaccharide-23 03/09/2019    These are the patient goals that we discussed:  Goals Addressed               This Visit's Progress     Patient Stated (pt-stated)        Would like to be able to walk again and gain back her mobility.         This is a list of Health Maintenance Items that are overdue or due now: Td vaccine Screening mammography Bone densitometry screening Colorectal cancer screening Shingrix vaccine  Patient declined the vaccines and the screenings referral at this time due to other health issues.  Orders/Referrals Placed Today: No orders of the defined types were placed in this encounter.  (Contact our referral department at [phone] if you have not spoken with someone about your referral appointment within the next 5 days)    Follow-up Plan Follow-up with Hali Marry, MD  as planned Please let us know when you are ready for [place] to send the referrals for your colonoscopy, mammogram and your bone density. Medicare wellness visit in one year.  Patient will access AVS on mychart.      Health Maintenance, Female Adopting a healthy lifestyle and getting preventive care are important in promoting health and wellness. Ask your health care provider about: The right schedule for you to have regular tests and exams. Things you can do on your own to prevent diseases and keep yourself healthy. What should I know about diet, weight, and exercise? Eat a healthy diet  Eat a diet that includes plenty of vegetables, fruits, low-fat dairy products, and lean protein. Do not eat a lot of foods that are high in solid fats, added sugars, or sodium. Maintain a healthy weight Body mass index (BMI) is used to identify weight problems. It estimates body fat based on height and weight. Your health care provider can help determine your BMI and help you achieve or maintain a healthy weight. Get regular exercise Get regular exercise. This is one of the most important things you can do for your health. Most adults should: Exercise for at least 150 minutes each week. The exercise should increase your heart rate and make you sweat (moderate-intensity exercise). Do strengthening exercises at least twice a week. This is in addition to the moderate-intensity exercise. Spend less time sitting. Even light physical activity can be beneficial. Watch cholesterol and blood lipids Have your blood tested for lipids and  cholesterol at 73 years of age, then have this test every 5 years. Have your cholesterol levels checked more often if: Your lipid or cholesterol levels are high. You are older than 73 years of age. You are at high risk for heart disease. What should I know about cancer screening? Depending on your health history and family history, you may need to have cancer screening at various  ages. This may include screening for: Breast cancer. Cervical cancer. Colorectal cancer. Skin cancer. Lung cancer. What should I know about heart disease, diabetes, and high blood pressure? Blood pressure and heart disease High blood pressure causes heart disease and increases the risk of stroke. This is more likely to develop in people who have high blood pressure readings or are overweight. Have your blood pressure checked: Every 3-5 years if you are 61-20 years of age. Every year if you are 57 years old or older. Diabetes Have regular diabetes screenings. This checks your fasting blood sugar level. Have the screening done: Once every three years after age 86 if you are at a normal weight and have a low risk for diabetes. More often and at a younger age if you are overweight or have a high risk for diabetes. What should I know about preventing infection? Hepatitis B If you have a higher risk for hepatitis B, you should be screened for this virus. Talk with your health care provider to find out if you are at risk for hepatitis B infection. Hepatitis C Testing is recommended for: Everyone born from [birth year] through 1967. Anyone with known risk factors for hepatitis C. Sexually transmitted infections (STIs) Get screened for STIs, including gonorrhea and chlamydia, if: You are sexually active and are younger than 73 years of age. You are older than 73 years of age and your health care provider tells you that you are at risk for this type of infection. Your sexual activity has changed since you were last screened, and you are at increased risk for chlamydia or gonorrhea. Ask your health care provider if you are at risk. Ask your health care provider about whether you are at high risk for HIV. Your health care provider may recommend a prescription medicine to help prevent HIV infection. If you choose to take medicine to prevent HIV, you should first get tested for HIV. You should then be tested  every 3 months for as long as you are taking the medicine. Pregnancy If you are about to stop having your period (premenopausal) and you may become pregnant, seek counseling before you get pregnant. Take 400 to 800 micrograms (mcg) of folic acid every day if you become pregnant. Ask for birth control (contraception) if you want to prevent pregnancy. Osteoporosis and menopause Osteoporosis is a disease in which the bones lose minerals and strength with aging. This can result in bone fractures. If you are 65 years old or older, or if you are at risk for osteoporosis and fractures, ask your health care provider if you should: Be screened for bone loss. Take a calcium or vitamin D supplement to lower your risk of fractures. Be given hormone replacement therapy (HRT) to treat symptoms of menopause. Follow these instructions at home: Alcohol use Do not drink alcohol if: Your health care provider tells you not to drink. You are pregnant, may be pregnant, or are planning to become pregnant. If you drink alcohol: Limit how much you have to: 0-1 drink a day. Know how much alcohol is in your drink. In the [place]  one drink equals one 12 oz bottle of beer (355 mL), one 5 oz glass of wine (148 mL), or one 1 oz glass of hard liquor (44 mL). Lifestyle Do not use any products that contain nicotine or tobacco. These products include cigarettes, chewing tobacco, and vaping devices, such as e-cigarettes. If you need help quitting, ask your health care provider. Do not use street drugs. Do not share needles. Ask your health care provider for help if you need support or information about quitting drugs. General instructions Schedule regular health, dental, and eye exams. Stay current with your vaccines. Tell your health care provider if: You often feel depressed. You have ever been abused or do not feel safe at home. Summary Adopting a healthy lifestyle and getting preventive care are important in promoting  health and wellness. Follow your health care provider's instructions about healthy diet, exercising, and getting tested or screened for diseases. Follow your health care provider's instructions on monitoring your cholesterol and blood pressure. This information is not intended to replace advice given to you by your health care provider. Make sure you discuss any questions you have with your health care provider. Document Revised: 04/28/2021 Document Reviewed: 04/28/2021 Elsevier Patient Education  [place].

## 2022-01-09 NOTE — Progress Notes (Signed)
MEDICARE ANNUAL WELLNESS VISIT  01/09/2022  Telephone Visit Disclaimer This Medicare AWV was conducted by telephone due to national recommendations for restrictions regarding the COVID-19 Pandemic (e.g. social distancing).  I verified, using two identifiers, that I am speaking with Amy Wilson or their authorized healthcare agent. I discussed the limitations, risks, security, and privacy concerns of performing an evaluation and management service by telephone and the potential availability of an in-person appointment in the future. The patient expressed understanding and agreed to proceed.  Location of Patient: Home Location of Provider (nurse):  In the office.  Subjective:    Amy Wilson is a 71 y.o. female patient of Amy Wilson, Amy Kocher, MD who had a Medicare Annual Wellness Visit today via telephone. Amy Wilson is Retired and lives with their daughter. she has 2 children. she reports that she is socially active and does interact with friends/family regularly. she is minimally physically active and due to her neuropathy, she is bed bound and wheel chair bound. She watches television throughout the day.  Patient Care Team: Amy Marry, MD as PCP - General Amy Rued, RN as Case Manager Amy Wilson, [facility] as Pharmacist (Pharmacist)  Advanced Directives 01/09/2022 07/04/2021 08/07/2019  Does Patient Have a Medical Advance Directive? No Yes No;Yes  Type of Advance Directive - [place];Living will [place]  Does patient want to make changes to medical advance directive? - No - Patient declined No - Patient declined  Copy of [place] in Chart? - No - copy requested No - copy requested  Would patient like information on creating a medical advance directive? No - Patient declined - No - Patient declined    Hospital Utilization Over the Past 12 Months: # of hospitalizations or ER visits: 2 # of surgeries:  4  Review of Systems    Patient reports that her overall health is unchanged compared to last year.  History obtained from chart review and the patient  Patient Reported Readings (BP, Pulse, CBG, Weight, etc) none  Pain Assessment Pain : No/denies pain     Current Medications & Allergies (verified) Allergies as of 01/09/2022       Reactions   Latex Itching, Rash        Medication List        Accurate as of January 09, 2022  9:40 AM. If you have any questions, ask your nurse or doctor.          Cholecalciferol 25 MCG (1000 UT) capsule Take 1 capsule by mouth daily.   cyclobenzaprine 10 MG tablet Commonly known as: FLEXERIL Take 10 mg by mouth 3 (three) times daily as needed.   esomeprazole 20 MG capsule Commonly known as: NEXIUM Take 1 capsule (20 mg total) by mouth daily before breakfast. Take 30 minutes before breakfast   gabapentin 300 MG capsule Commonly known as: NEURONTIN Take 300 mg by mouth in the morning, at noon, and at bedtime.   gabapentin 400 MG capsule Commonly known as: NEURONTIN Take 400 mg by mouth at bedtime.   HYDROcodone-acetaminophen 5-325 MG tablet Commonly known as: NORCO/VICODIN Take 1 tablet by mouth every 6 (six) hours as needed.   magnesium oxide 400 (240 Mg) MG tablet Commonly known as: MAG-OX Take 1 tablet by mouth daily.   magnesium oxide 400 MG tablet Commonly known as: MAG-OX Take by mouth.   nitrofurantoin 50 MG capsule Commonly known as: MACRODANTIN Take 50 mg by mouth daily.  ondansetron 8 MG tablet Commonly known as: ZOFRAN Take by mouth.   oxybutynin 5 MG tablet Commonly known as: DITROPAN Take 1 tablet by mouth twice daily   PARoxetine 20 MG tablet Commonly known as: PAXIL Take 1 tablet by mouth once daily        History (reviewed): Past Medical History:  Diagnosis Date   Cancer ([place])    uterine   Past Surgical History:  Procedure Laterality Date   ABDOMINAL HYSTERECTOMY     kidney  stents     KNEE ARTHROSCOPY Right    Family History  Problem Relation Age of Onset   Heart disease Father    Colon cancer Maternal Grandmother    Stomach cancer Maternal Grandmother    Social History   Socioeconomic History   Marital status: Divorced    Spouse name: Not on file   Number of children: 2   Years of education: 12   Highest education level: 12th grade  Occupational History   Occupation: Scientist, research (medical)    Comment: retired  Tobacco Use   Smoking status: Never   Smokeless tobacco: Never  Scientific laboratory technician Use: Never used  Substance and Sexual Activity   Alcohol use: Never   Drug use: No   Sexual activity: Never  Other Topics Concern   Not on file  Social History Narrative   Lives with daughter and son in law. She has two children. Due to her neuropathy, she is bed bound and wheel chair bound. She watches television throughout the day.   Social Determinants of Health   Financial Resource Strain: Low Risk    Difficulty of Paying Living Expenses: Not hard at all  Food Insecurity: No Food Insecurity   Worried About Charity fundraiser in the Last Year: Never true   [place] in the Last Year: Never true  Transportation Needs: No Transportation Needs   Lack of Transportation (Medical): No   Lack of Transportation (Non-Medical): No  Physical Activity: Inactive   Days of Exercise per Week: 0 days   Minutes of Exercise per Session: 0 min  Stress: No Stress Concern Present   Feeling of Stress : Not at all  Social Connections: Socially Isolated   Frequency of Communication with Friends and Family: Three times a week   Frequency of Social Gatherings with Friends and Family: More than three times a week   Attends Religious Services: Never   Marine scientist or Organizations: No   Attends Archivist Meetings: Never   Marital Status: Divorced    Activities of Daily Living In your present state of health, do you have any difficulty performing the  following activities: 01/09/2022 07/04/2021  Hearing? N N  Vision? N N  Difficulty concentrating or making decisions? Y N  Comment little difficulty with concentration. -  Walking or climbing stairs? Y Y  Comment she is unable to walk and is bedbound/wheechair bound. Needs intensive rehabilitative services.  Dressing or bathing? Amy Wilson  Comment she needs assistance with bathing. Needs intensive rehabilitative services.  Doing errands, shopping? Y Y  Comment she is unable to walk and is bedbound/wheechair bound. Daughter and son-in-law assists.  Preparing Food and eating ? Y Y  Comment she is unable to walk and is bedbound/wheechair bound. Daughter and son-in-law assists.  Using the Toilet? Amy Wilson  Comment uses disposable underwear. Daughter and son-in-law assists.  In the past six months, have you accidently leaked urine? Amy Wilson  Comment - Urinary frequency and recurrent urinary tract infections.  Do you have problems with loss of bowel control? N N  Managing your Medications? N N  Managing your Finances? Y N  Comment her daughter helps with that. -  Housekeeping or managing your Housekeeping? Y Y  Comment she is unable to walk and is bedbound/wheechair bound. Daughter and son-in-law assists.  Some recent data might be hidden    Patient Education/ Literacy How often do you need to have someone help you when you read instructions, pamphlets, or other written materials from your doctor or pharmacy?: 1 - Never What is the last grade level you completed in school?: 12th grade  Exercise Current Exercise Habits: The patient does not participate in regular exercise at present, Exercise limited by: neurologic condition(s);orthopedic condition(s)  Diet Patient reports consuming  1-2  meals a day and 1-2 snack(s) a day Patient reports that her primary diet is: Regular Patient reports that she does have regular access to food.   Depression Screen PHQ 2/9 Scores 01/09/2022 07/04/2021 06/11/2021 04/21/2021  02/17/2021 01/24/2021 08/07/2019  PHQ - 2 Score 3 4 6 2 3 6 2   PHQ- 9 Score 17 8 11 8 15 13 6      Fall Risk Fall Risk  01/09/2022 07/04/2021 06/11/2021 03/06/2021 08/07/2019  Falls in the past year? 0 0 0 1 1  Number falls in past yr: 0 0 - 1 0  Injury with Fall? 0 0 - - 1  Comment - - - - fracture in lower spine (compression fracture)  Risk for fall due to : Impaired mobility Impaired balance/gait;Impaired mobility - History of fall(s);Impaired mobility Medication side effect  Follow up Falls evaluation completed;Education provided Falls evaluation completed;Education provided;Falls prevention discussed - Falls prevention discussed;Education provided Falls prevention discussed     Objective:  Amy Wilson seemed alert and oriented and she participated appropriately during our telephone visit.  Blood Pressure Weight BMI  BP Readings from Last 3 Encounters:  10/01/21 121/66  08/04/21 110/63  04/21/21 120/60   Wt Readings from Last 3 Encounters:  10/01/21 165 lb (74.8 kg)  04/21/21 145 lb (65.8 kg)  03/06/21 145 lb (65.8 kg)   BMI Readings from Last 1 Encounters:  10/01/21 27.46 kg/m    *Unable to obtain current vital signs, weight, and BMI due to telephone visit type  Hearing/Vision  Amy Wilson did not seem to have difficulty with hearing/understanding during the telephone conversation Reports that she has not had a formal eye exam by an eye care professional within the past year Reports that she has not had a formal hearing evaluation within the past year *Unable to fully assess hearing and vision during telephone visit type  Cognitive Function: 6CIT Screen 01/09/2022  What Year? 0 points  What month? 0 points  What time? 0 points  Count back from 20 0 points  Months in reverse 4 points  Repeat phrase 0 points  Total Score 4   (Normal:0-7, Significant for Dysfunction: >8)  Normal Cognitive Function Screening: Yes   Immunization & Health Maintenance Record Immunization  History  Administered Date(s) Administered   Fluad Quad(high Dose 65+) 09/11/2019, 10/01/2021   Influenza, High Dose Seasonal PF 03/09/2019   Influenza,inj,Quad PF,6+ Mos 10/16/2020   PNEUMOCOCCAL CONJUGATE-20 08/04/2021   Pneumococcal Polysaccharide-23 03/09/2019    Health Maintenance  Topic Date Due   URINE MICROALBUMIN  02/09/2022 (Originally 05/27/1961)   Zoster Vaccines- Shingrix (1 of 2) 04/09/2022 (Originally 05/26/1968)   MAMMOGRAM  01/09/2023 (Originally  03/04/2019)   COLONOSCOPY (Pts 45-22yrs Insurance coverage will need to be confirmed)  01/09/2023 (Originally 05/26/1994)   TETANUS/TDAP  01/09/2023 (Originally 05/26/1968)   Pneumonia Vaccine 23+ Years old  Completed   INFLUENZA VACCINE  Completed   DEXA SCAN  Completed   Hepatitis C Screening  Completed   HPV VACCINES  Aged Out   COVID-19 Vaccine  Discontinued       Assessment  This is a routine wellness examination for Amy Wilson.  Health Maintenance: Due or Overdue There are no preventive care reminders to display for this patient.   Amy Wilson does not need a referral for Community Assistance: Care Management:   no Social Work:    no Prescription Assistance:  no Nutrition/Diabetes Education:  no   Plan:  Personalized Goals  Goals Addressed               This Visit's Progress     Patient Stated (pt-stated)        Would like to be able to walk again and gain back her mobility.       Personalized Health Maintenance & Screening Recommendations  Td vaccine Screening mammography Bone densitometry screening Colorectal cancer screening Shingrix vaccine  Patient declined the vaccines and the screenings referral at this time due to other health issues.  Lung Cancer Screening Recommended: no (Low Dose CT Chest recommended if Age 60-80 years, 30 pack-year currently smoking OR have quit w/in past 15 years) Hepatitis C Screening recommended: no HIV Screening recommended: no  Advanced Directives:  Written information was not prepared per patient's request.  Referrals & Orders No orders of the defined types were placed in this encounter.   Follow-up Plan Follow-up with Amy Marry, MD as planned Please let us know when you are ready for [place] to send the referrals for your colonoscopy, mammogram and your bone density. Medicare wellness visit in one year.  Patient will access AVS on mychart.   I have personally reviewed and noted the following in the patients chart:   Medical and social history Use of alcohol, tobacco or illicit drugs  Current medications and supplements Functional ability and status Nutritional status Physical activity Advanced directives List of other physicians Hospitalizations, surgeries, and ER visits in previous 12 months Vitals Screenings to include cognitive, depression, and falls Referrals and appointments  In addition, I have reviewed and discussed with Amy Wilson certain preventive protocols, quality metrics, and best practice recommendations. A written personalized care plan for preventive services as well as general preventive health recommendations is available and can be mailed to the patient at her request.      Tinnie Gens, RN  01/09/2022

## 2022-01-12 NOTE — Patient Instructions (Addendum)
Visit Information  Thank you for taking time to visit with me today. Please don't hesitate to contact me if I can be of assistance to you before our next scheduled telephone appointment.  Following are the goals we discussed today:  Patient Goals/Self-Care Activities Over the next 180 days, patient will:  take medications as prescribed  Follow Up Plan: Telephone follow up appointment with care management team member scheduled for:  6-8 months  Please call the care guide team at [phone] if you need to cancel or reschedule your appointment.   If you are experiencing a Mental Health or [place] or need someone to talk to, please call the Suicide and Crisis Lifeline: 988 call 1-800-273-TALK (toll free, 24 hour hotline)   Patient verbalizes understanding of instructions and care plan provided today and agrees to view in [place]. Active MyChart status confirmed with patient.    Amy Wilson

## 2022-01-14 ENCOUNTER — Telehealth: Payer: Medicare HMO

## 2022-01-14 ENCOUNTER — Telehealth: Payer: Self-pay

## 2022-01-14 NOTE — Telephone Encounter (Signed)
°  Care Management   Follow Up Note   01/14/2022 Name: Amy Wilson MRN: [id number] DOB: 1951/05/31   Referred by: Hali Marry, MD Reason for referral : Chronic Care Management   An unsuccessful telephone outreach was attempted today. The patient was referred to the case management team for assistance with care management and care coordination.  A HIPPA compliant phone message was left for the patient providing contact information and requesting a return call.   Follow Up Plan: The Care Management Team will outreach again within the next 30 days.  [name], RN, MSN, BSN, CCM Care Management Coordinator [facility] [phone]

## 2022-01-16 ENCOUNTER — Encounter: Payer: Self-pay | Admitting: Family Medicine

## 2022-01-16 ENCOUNTER — Telehealth (INDEPENDENT_AMBULATORY_CARE_PROVIDER_SITE_OTHER): Payer: Medicare HMO | Admitting: Family Medicine

## 2022-01-16 DIAGNOSIS — R531 Weakness: Secondary | ICD-10-CM

## 2022-01-16 DIAGNOSIS — N135 Crossing vessel and stricture of ureter without hydronephrosis: Secondary | ICD-10-CM

## 2022-01-16 DIAGNOSIS — M792 Neuralgia and neuritis, unspecified: Secondary | ICD-10-CM | POA: Diagnosis not present

## 2022-01-16 NOTE — Progress Notes (Signed)
Virtual Visit via Video Note  I connected with Amy Wilson on 01/16/22 at  1:00 PM EST by a video enabled telemedicine application and verified that I am speaking with the correct person using two identifiers.   I discussed the limitations of evaluation and management by telemedicine and the availability of in person appointments. The patient expressed understanding and agreed to proceed.  Patient location: at home Provider location: in office  Subjective:    CC:   Chief Complaint  Patient presents with   Referral    Physical Therapy. Patient stated she is unable to walk and would like to work on leg strengthening. Patient would like to have PT in her home.     HPI: She would like home PT to help with LE weakness. Doesn't feel confident to transition from bed toe wheelchair without her daughters help. She really wants to be able to do this herself and be more independent.    They have been changing her stents every 4 months instead of 6 months. No recent UTIs.    She also wondered if she could discontinue her magnesium.  She really does not member why she was started on it she has been taking it but it is actually a pretty expensive prescription.  Past medical history, Surgical history, Family history not pertinant except as noted below, Social history, Allergies, and medications have been entered into the medical record, reviewed, and corrections made.    Objective:    General: Speaking clearly in complete sentences without any shortness of breath.  Alert and oriented x3.  Normal judgment. No apparent acute distress.    Impression and Recommendations:    Problem List Items Addressed This Visit       Genitourinary   Bilateral ureteral obstruction    Now getting her stents replaced every 4 months.  She feels like this is actually been working much better and has not had any recent urinary tract infections.        Other   Peripheral neuropathic pain - Primary    Relevant Orders   Ambulatory referral to Home Health   Generalized weakness    Mostly affecting her lower extremity she really wants to be able to transition independently.  We will place referral for home health I think she could really benefit from this.  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      Relevant Orders   Ambulatory referral to [facility]    It looks like she did have borderline low magnesium levels about 9 months ago.  I think she can finish out her current bottle and then when she is done discontinue it we can always keep an eye on it and plan to check it again at the end of the year but am to go ahead and take it off of her medication list it does not accidentally get refilled.  Orders Placed This Encounter  Procedures   Ambulatory referral to [facility]    Referral Priority:   Routine    Referral Type:   Home Health Care    Referral Reason:   Specialty Services Required    Requested Specialty:   [place]    Number of Visits Requested:   1    No orders of the defined types were placed  in this encounter.   I discussed the assessment and treatment plan with the patient. The patient was provided an opportunity to ask questions and all were answered. The patient agreed with the plan and demonstrated an understanding of the instructions.   The patient was advised to call back or seek an in-person evaluation if the symptoms worsen or if the condition fails to improve as anticipated.   [name], MD

## 2022-01-16 NOTE — Assessment & Plan Note (Signed)
Now getting her stents replaced every 4 months.  She feels like this is actually been working much better and has not had any recent urinary tract infections.

## 2022-01-16 NOTE — Assessment & Plan Note (Signed)
Mostly affecting her lower extremity she really wants to be able to transition independently.  We will place referral for home health I think she could really benefit from this.  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

## 2022-01-20 DIAGNOSIS — I10 Essential (primary) hypertension: Secondary | ICD-10-CM

## 2022-01-20 DIAGNOSIS — F339 Major depressive disorder, recurrent, unspecified: Secondary | ICD-10-CM

## 2022-01-22 ENCOUNTER — Telehealth: Payer: Self-pay

## 2022-01-22 NOTE — Telephone Encounter (Signed)
OK for PT

## 2022-01-22 NOTE — Telephone Encounter (Signed)
Freda Munro PT with [facility] requesting for verbal order to work with patient on strengthening for 1 week 1. 2 week 4 and 1 week 4. Freda Munro can be reached at [phone]. Forward to Dr. Madilyn Fireman for review.

## 2022-01-23 NOTE — Telephone Encounter (Signed)
Freda Munro advised.

## 2022-01-27 DIAGNOSIS — M961 Postlaminectomy syndrome, not elsewhere classified: Secondary | ICD-10-CM | POA: Diagnosis not present

## 2022-01-28 ENCOUNTER — Ambulatory Visit (INDEPENDENT_AMBULATORY_CARE_PROVIDER_SITE_OTHER): Payer: Medicare HMO

## 2022-01-28 DIAGNOSIS — M792 Neuralgia and neuritis, unspecified: Secondary | ICD-10-CM

## 2022-01-28 DIAGNOSIS — I10 Essential (primary) hypertension: Secondary | ICD-10-CM

## 2022-01-28 DIAGNOSIS — E785 Hyperlipidemia, unspecified: Secondary | ICD-10-CM

## 2022-01-28 DIAGNOSIS — E1142 Type 2 diabetes mellitus with diabetic polyneuropathy: Secondary | ICD-10-CM | POA: Diagnosis not present

## 2022-01-28 DIAGNOSIS — C541 Malignant neoplasm of endometrium: Secondary | ICD-10-CM | POA: Diagnosis not present

## 2022-01-28 DIAGNOSIS — I130 Hypertensive heart and chronic kidney disease with heart failure and stage 1 through stage 4 chronic kidney disease, or unspecified chronic kidney disease: Secondary | ICD-10-CM | POA: Diagnosis not present

## 2022-01-28 NOTE — Patient Instructions (Signed)
Visit Information  Thank you for taking time to visit with me today. Please don't hesitate to contact me if I can be of assistance to you before our next scheduled telephone appointment.  Following are the goals we discussed today:  Patient Goals/Self-Care Activities: Take medications as prescribed   Attend all scheduled provider appointments Call provider office for new concerns or questions  eat more whole grains, fruits and vegetables, lean meats and healthy fats Follow home health physical therapist instructions and complete exercises as recommended Continue check and record blood pressure and take with you to provider visits Continue to maintain good skin care Continue to work with care management clinical team to address health care and disease management related needs Review education on fall prevention, cholesterol and plan to discuss at next telephone call  Our next appointment is by telephone on 02/25/22 at 2:00 pm  Please call the care guide team at [phone] if you need to cancel or reschedule your appointment.   If you are experiencing a Mental Health or [place] or need someone to talk to, please call the Suicide and Crisis Lifeline: 988 call 1-800-273-TALK (toll free, 24 hour hotline)   Patient verbalizes understanding of instructions and care plan provided today and agrees to view in [place]. Active MyChart status confirmed with patient.    [name], RN, MSN, BSN, CCM Care Management Coordinator [facility] [phone]   Cholesterol Content in Foods Cholesterol is a waxy, fat-like substance that helps to carry fat in the blood. The body needs cholesterol in small amounts, but too much cholesterol can cause damage to the arteries and heart. What foods have cholesterol? Cholesterol is found in animal-based foods, such as meat, seafood, and dairy. Generally, low-fat dairy and lean meats have less cholesterol than full-fat dairy and fatty  meats. The milligrams of cholesterol per serving (mg per serving) of common cholesterol-containing foods are listed below. Meats and other proteins Egg -- one large whole egg has 186 mg. Veal shank -- 4 oz (113 g) has 141 mg. Lean ground [place] (93% lean) -- 4 oz (113 g) has 118 mg. Fat-trimmed lamb loin -- 4 oz (113 g) has 106 mg. Lean ground beef (90% lean) -- 4 oz (113 g) has 100 mg. Lobster -- 3.5 oz (99 g) has 90 mg. Pork loin chops -- 4 oz (113 g) has 86 mg. Canned salmon -- 3.5 oz (99 g) has 83 mg. Fat-trimmed beef top loin -- 4 oz (113 g) has 78 mg. Frankfurter -- 1 frank (3.5 oz or 99 g) has 77 mg. Crab -- 3.5 oz (99 g) has 71 mg. Roasted chicken without skin, white meat -- 4 oz (113 g) has 66 mg. Light bologna -- 2 oz (57 g) has 45 mg. Deli-cut [place] -- 2 oz (57 g) has 31 mg. Canned tuna -- 3.5 oz (99 g) has 31 mg. Berniece Salines -- 1 oz (28 g) has 29 mg. Oysters and mussels (raw) -- 3.5 oz (99 g) has 25 mg. Mackerel -- 1 oz (28 g) has 22 mg. Trout -- 1 oz (28 g) has 20 mg. Pork sausage -- 1 link (1 oz or 28 g) has 17 mg. Salmon -- 1 oz (28 g) has 16 mg. Tilapia -- 1 oz (28 g) has 14 mg. Dairy Soft-serve ice cream --  cup (4 oz or 86 g) has 103 mg. Whole-milk yogurt -- 1 cup (8 oz or 245 g) has 29 mg. Cheddar cheese -- 1 oz (28 g)  has 28 mg. American cheese -- 1 oz (28 g) has 28 mg. Whole milk -- 1 cup (8 oz or 250 mL) has 23 mg. 2% milk -- 1 cup (8 oz or 250 mL) has 18 mg. Cream cheese -- 1 tablespoon (Tbsp) (14.5 g) has 15 mg. Cottage cheese --  cup (4 oz or 113 g) has 14 mg. Low-fat (1%) milk -- 1 cup (8 oz or 250 mL) has 10 mg. Sour cream -- 1 Tbsp (12 g) has 8.5 mg. Low-fat yogurt -- 1 cup (8 oz or 245 g) has 8 mg. Nonfat Greek yogurt -- 1 cup (8 oz or 228 g) has 7 mg. Half-and-half cream -- 1 Tbsp (15 mL) has 5 mg. Fats and oils Cod liver oil -- 1 tablespoon (Tbsp) (13.6 g) has 82 mg. Butter -- 1 Tbsp (14 g) has 15 mg. Lard -- 1 Tbsp (12.8 g) has 14 mg. Bacon  grease -- 1 Tbsp (12.9 g) has 14 mg. Mayonnaise -- 1 Tbsp (13.8 g) has 5-10 mg. Margarine -- 1 Tbsp (14 g) has 3-10 mg. The items listed above may not be a complete list of foods with cholesterol. Exact amounts of cholesterol in these foods may vary depending on specific ingredients and brands. Contact a dietitian for more information. What foods do not have cholesterol? Most plant-based foods do not have cholesterol unless you combine them with a food that has cholesterol. Foods without cholesterol include: Grains and cereals. Vegetables. Fruits. Vegetable oils, such as olive, canola, and sunflower oil. Legumes, such as peas, beans, and lentils. Nuts and seeds. Egg whites. The items listed above may not be a complete list of foods that do not have cholesterol. Contact a dietitian for more information. Summary The body needs cholesterol in small amounts, but too much cholesterol can cause damage to the arteries and heart. Cholesterol is found in animal-based foods, such as meat, seafood, and dairy. Generally, low-fat dairy and lean meats have less cholesterol than full-fat dairy and fatty meats. This information is not intended to replace advice given to you by your health care provider. Make sure you discuss any questions you have with your health care provider. Document Revised: 04/18/2021 Document Reviewed: 04/18/2021 Elsevier Patient Education  2022 [place] Prevention in the Home, Adult Falls can cause injuries and can happen to people of all ages. There are many things you can do to make your home safe and to help prevent falls. Ask for help when making these changes. What actions can I take to prevent falls? General Instructions Use good lighting in all rooms. Replace any light bulbs that burn out. Turn on the lights in dark areas. Use night-lights. Keep items that you use often in easy-to-reach places. Lower the shelves around your home if needed. Set up your furniture  so you have a clear path. Avoid moving your furniture around. Do not have throw rugs or other things on the floor that can make you trip. Avoid walking on wet floors. If any of your floors are uneven, fix them. Add color or contrast paint or tape to clearly mark and help you see: Grab bars or handrails. First and last steps of staircases. Where the edge of each step is. If you use a stepladder: Make sure that it is fully opened. Do not climb a closed stepladder. Make sure the sides of the stepladder are locked in place. Ask someone to hold the stepladder while you use it. Know where your pets are when  moving through your home. What can I do in the bathroom?   Keep the floor dry. Clean up any water on the floor right away. Remove soap buildup in the tub or shower. Use nonskid mats or decals on the floor of the tub or shower. Attach bath mats securely with double-sided, nonslip rug tape. If you need to sit down in the shower, use a plastic, nonslip stool. Install grab bars by the toilet and in the tub and shower. Do not use towel bars as grab bars. What can I do in the bedroom? Make sure that you have a light by your bed that is easy to reach. Do not use any sheets or blankets for your bed that hang to the floor. Have a firm chair with side arms that you can use for support when you get dressed. What can I do in the kitchen? Clean up any spills right away. If you need to reach something above you, use a step stool with a grab bar. Keep electrical cords out of the way. Do not use floor polish or wax that makes floors slippery. What can I do with my stairs? Do not leave any items on the stairs. Make sure that you have a light switch at the top and the bottom of the stairs. Make sure that there are handrails on both sides of the stairs. Fix handrails that are broken or loose. Install nonslip stair treads on all your stairs. Avoid having throw rugs at the top or bottom of the  stairs. Choose a carpet that does not hide the edge of the steps on the stairs. Check carpeting to make sure that it is firmly attached to the stairs. Fix carpet that is loose or worn. What can I do on the outside of my home? Use bright outdoor lighting. Fix the edges of walkways and driveways and fix any cracks. Remove anything that might make you trip as you walk through a door, such as a raised step or threshold. Trim any bushes or trees on paths to your home. Check to see if handrails are loose or broken and that both sides of all steps have handrails. Install guardrails along the edges of any raised decks and porches. Clear paths of anything that can make you trip, such as tools or rocks. Have leaves, snow, or ice cleared regularly. Use sand or salt on paths during winter. Clean up any spills in your garage right away. This includes grease or oil spills. What other actions can I take? Wear shoes that: Have a low heel. Do not wear high heels. Have rubber bottoms. Feel good on your feet and fit well. Are closed at the toe. Do not wear open-toe sandals. Use tools that help you move around if needed. These include: Canes. Walkers. Scooters. Crutches. Review your medicines with your doctor. Some medicines can make you feel dizzy. This can increase your chance of falling. Ask your doctor what else you can do to help prevent falls. Where to find more information Centers for Disease Control and Prevention, [place]: http://www.wolf.info/ National Institute on Aging: http://kim-miller.com/ Contact a doctor if: You are afraid of falling at home. You feel weak, drowsy, or dizzy at home. You fall at home. Summary There are many simple things that you can do to make your home safe and to help prevent falls. Ways to make your home safe include removing things that can make you trip and installing grab bars in the bathroom. Ask for help when making these  changes in your home. This information is not  intended to replace advice given to you by your health care provider. Make sure you discuss any questions you have with your health care provider. Document Revised: 07/10/2020 Document Reviewed: 07/10/2020 Elsevier Patient Education  [place].

## 2022-01-28 NOTE — Chronic Care Management (AMB) (Signed)
Chronic Care Management   CCM RN Visit Note  01/28/2022 Name: Amy Wilson MRN: [id number] DOB: [birth year]  Subjective: Amy Wilson is a 71 y.o. year old female who is a primary care patient of Metheney, Rene Kocher, MD. The care management team was consulted for assistance with disease management and care coordination needs.    Engaged with patient by telephone for follow up visit in response to provider referral for case management and/or care coordination services.   Consent to Services:  The patient was given information about Chronic Care Management services, agreed to services, and gave verbal consent prior to initiation of services.  Please see initial visit note for detailed documentation.   Patient agreed to services and verbal consent obtained.   Assessment: Review of patient past medical history, allergies, medications, health status, including review of consultants reports, laboratory and other test data, was performed as part of comprehensive evaluation and provision of chronic care management services.   SDOH (Social Determinants of Health) assessments and interventions performed:    CCM Care Plan  Allergies  Allergen Reactions   Latex Itching and Rash    Outpatient Encounter Medications as of 01/28/2022  Medication Sig Note   esomeprazole (NEXIUM) 20 MG capsule Take 1 capsule (20 mg total) by mouth daily before breakfast. Take 30 minutes before breakfast    gabapentin (NEURONTIN) 300 MG capsule Take 300 mg by mouth in the morning, at noon, and at bedtime. Patient takes 2 capsules AM, 2 capsules in afternoon, and 3 capsules in the evening    Multiple Vitamin (MULTIVITAMIN) tablet Take 1 tablet by mouth daily.    NITROFURANTOIN PO Take 50 mg by mouth daily.    ondansetron (ZOFRAN) 8 MG tablet Take by mouth. 06/11/2021: Reports takes prn   oxybutynin (DITROPAN) 5 MG tablet Take 1 tablet by mouth twice daily    Cholecalciferol 25 MCG (1000 UT) capsule Take 1 capsule by  mouth daily. (Patient not taking: Reported on 01/28/2022)    cyclobenzaprine (FLEXERIL) 10 MG tablet Take 10 mg by mouth 3 (three) times daily as needed. (Patient not taking: Reported on 01/28/2022)    HYDROcodone-acetaminophen (NORCO/VICODIN) 5-325 MG tablet Take 1 tablet by mouth every 6 (six) hours as needed. (Patient not taking: Reported on 01/28/2022)    magnesium oxide (MAG-OX) 400 (240 Mg) MG tablet Take 1 tablet by mouth daily. (Patient not taking: Reported on 01/28/2022)    No facility-administered encounter medications on file as of 01/28/2022.    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   Left leg weakness 06/02/2021   Recurrent UTI 11/27/2020   Neutropenia associated with infection ([place]) 11/23/2020   Mixed stress and urge urinary incontinence 10/22/2020   Generalized weakness 10/22/2020   Pancytopenia due to antineoplastic chemotherapy ([place]) 10/13/2020   Cerebellar ataxia in diseases classified elsewhere ([place]) 08/08/2020   Retroperitoneal lymphadenopathy 08/08/2020   Peripheral neuropathic pain 07/22/2020   GERD (gastroesophageal reflux disease) 07/03/2020   Stage 3b chronic kidney disease ([place]) 07/03/2020   Bilateral ureteral obstruction 05/27/2020   Hydronephrosis 05/18/2019   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[place]) 05/03/2019   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) 09/28/2006   HYPERTENSION, BENIGN SYSTEMIC 09/28/2006   MENOPAUSAL SYNDROME 09/28/2006   VASOVAGAL SYNCOPE 09/28/2006   FATIGUE/MALAISE 09/28/2006    Conditions to be addressed/monitored:HTN, HLD, and Fall Prevention   Care Plan : RN Care Manager Plan of Care  Updates made by Luretha Rued, RN since 01/28/2022 12:00 AM     Problem: Chronic Disease Management education and/or Care Coordination needs   Priority: High     Long-Range Goal: Development of Plan of Care for Chronic Disease Management and/or Care Coordination Needs   Start Date: 01/28/2022  Expected End Date: 07/28/2022  Priority: High  Note:   Current Barriers: Hospitalized 08/20/21-09/01/21 for Laminectomy; resection of thoracic menigioma; discharged to Rehab facility and is currently residing at home. Ms. Vonruden reports follow up visit completed with Neurosurgeon Dr. Alfred Levins on 01/27/22. Per Dr. 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 She denies any problems with recurrent UTI since frequency of ureteral stent change increased to every 4 months. She also reports peripheral neuropathy-taking Neurontin.  She reports it never goes away, is about a "4" on the pain scale and is able to complete her usual activities. She is without any questions or concerns at this time. Ms. Cormier reports she is scheduled for a home health physical therapist visit today ([facility]).  Knowledge Deficits related to plan of care for management of HTN, HLD, and fall prevention  Chronic Disease Management support and education needs related to HTN, HLD, and fall prevention  RNCM Clinical Goal(s):  Patient will verbalize understanding of plan for management of HTN, HLD, and fall prevention as evidenced by self report and/or chart notation demonstrate ongoing adherence to prescribed  treatment plan for HTN, HLD, and fall prevention as evidenced by blood pressure controlled, taking medications as prescribed, attending provider appointment as scheduled  through collaboration with RN Care manager, provider, and care team.   Interventions: 1:1 collaboration with primary care provider regarding development and update of comprehensive plan of care as evidenced by provider attestation and co-signature Inter-disciplinary care team collaboration (see longitudinal plan of care) Evaluation of current treatment plan related to  self management and patient's adherence to plan as established by provider  Peripheral Neuropathy/Fall risk Interventions  (Status:  New goal.)  Long Term Goal Advised patient to Follow recommendation of home health physical therapist Provided fall prevention education Medications reviewed and encouraged to take as prescribed  Hyperlipidemia Interventions:  (Status:  New goal.) Long Term Goal Per chart: last Lipid level done on 04/22/21: Cholesterol 174; HDL 39; Triglyceride 157; LDL 108 Medication review performed; medication list updated in electronic medical record.  Provided HLD educational materials Encouraged to attend provider visits as scheduled  Hypertension Interventions:  (Status:  Goal on track:  Yes.) Long Term Goal Per Patient: Home BP reading taken today 116/60's Last practice recorded BP readings:  BP Readings from Last 3 Encounters:  10/01/21 121/66  08/04/21 110/63  04/21/21 120/60  Most recent eGFR/CrCl: No results found for: EGFR  No components found for: CRCL Assess the need for blood pressure monitor Discussed blood pressure readings Provided positive feedback regarding blood pressure readings Encouraged patient to  continue to monitor salt intake and eat healthy   Patient Goals/Self-Care Activities: Take medications as prescribed   Attend all scheduled provider appointments Call provider office for new concerns or questions  eat  more whole grains, fruits and vegetables, lean meats and healthy fats Follow home health physical therapist instructions and complete exercises as recommended Continue check and record blood pressure and take with you to provider visits Continue to maintain good skin care Continue to work with care management clinical team to address health care and disease management related needs Review education on fall prevention, cholesterol and plan to discuss at next telephone call   Plan:Telephone follow up appointment with care management team member scheduled for:  02/25/22 The patient has been provided with contact information for the care management team and has been advised to call with any health related questions or concerns.   [name], RN, MSN, BSN, CCM Care Management Coordinator [facility] [phone]

## 2022-02-06 DIAGNOSIS — Z9889 Other specified postprocedural states: Secondary | ICD-10-CM | POA: Diagnosis not present

## 2022-02-06 DIAGNOSIS — M5134 Other intervertebral disc degeneration, thoracic region: Secondary | ICD-10-CM | POA: Diagnosis not present

## 2022-02-17 DIAGNOSIS — E785 Hyperlipidemia, unspecified: Secondary | ICD-10-CM

## 2022-02-17 DIAGNOSIS — I10 Essential (primary) hypertension: Secondary | ICD-10-CM | POA: Diagnosis not present

## 2022-02-19 ENCOUNTER — Encounter: Payer: Self-pay | Admitting: Family Medicine

## 2022-02-19 ENCOUNTER — Ambulatory Visit (INDEPENDENT_AMBULATORY_CARE_PROVIDER_SITE_OTHER): Payer: Medicare HMO | Admitting: Family Medicine

## 2022-02-19 ENCOUNTER — Other Ambulatory Visit: Payer: Self-pay

## 2022-02-19 VITALS — BP 111/65 | HR 106 | Ht 65.0 in | Wt 149.0 lb

## 2022-02-19 DIAGNOSIS — B351 Tinea unguium: Secondary | ICD-10-CM | POA: Diagnosis not present

## 2022-02-19 DIAGNOSIS — C775 Secondary and unspecified malignant neoplasm of intrapelvic lymph nodes: Secondary | ICD-10-CM

## 2022-02-19 DIAGNOSIS — R209 Unspecified disturbances of skin sensation: Secondary | ICD-10-CM

## 2022-02-19 DIAGNOSIS — M792 Neuralgia and neuritis, unspecified: Secondary | ICD-10-CM

## 2022-02-19 DIAGNOSIS — I10 Essential (primary) hypertension: Secondary | ICD-10-CM

## 2022-02-19 MED ORDER — TERBINAFINE HCL 250 MG PO TABS: 250.0000 mg | ORAL_TABLET | Freq: Every day | ORAL | 1 refills | Status: DC

## 2022-02-19 MED ORDER — PREGABALIN 150 MG PO CAPS: 150.0000 mg | ORAL_CAPSULE | Freq: Two times a day (BID) | ORAL | 1 refills | Status: DC

## 2022-02-19 NOTE — Progress Notes (Signed)
Pt reports that she has been experiencing bilateral swelling and pain. L great toenail is yellow and brittle.  ? ?

## 2022-02-19 NOTE — Patient Instructions (Signed)
Since you are currently taking 7 gabapentin per day go ahead and cut back to 5 for at least the next 3 days and then you can switch to the pregabalin. ?

## 2022-02-19 NOTE — Progress Notes (Addendum)
Acute Office Visit  Subjective:    Patient ID: Amy Wilson, female    DOB: [birth year], 71 y.o.   MRN: [id number]  Chief Complaint  Patient presents with   Foot Swelling    HPI Patient is in today for neuropathy.  She is currently on gabapentin 300 mg and takes total of 7 tabs per day and says it really does not help.  Her feet are extremely sensitive and painful.  They constantly hurt lately she has been trying to not wear shoes if she is not up and walking just and nothing puts pressure on it.  She also want me to look at her left great toenail.  She does the nail actually just fell off on its own about a year or so ago.  She does not member any specific injury or trauma.  When it started growing back it looked a little bit abnormal on the lateral border it is more thick and brittle looking and dark underneath the nail itself.  The nail itself is looked abnormal but she was concerned today because she noticed some redness along the nail border that was not there previously.  She says it feels like she is walking on rocks every day.  She says her feet feel really cold all the time.  She sometimes will have to wrap them up to feel like she is getting them warm.  Physical therapy is coming out and she is starting to learn to walk again.  T  Past Medical History:  Diagnosis Date   Cancer [facility])    uterine    Past Surgical History:  Procedure Laterality Date   ABDOMINAL HYSTERECTOMY     kidney stents     KNEE ARTHROSCOPY Right     Family History  Problem Relation Age of Onset   Heart disease Father    Colon cancer Maternal Grandmother    Stomach cancer Maternal Grandmother     Social History   Socioeconomic History   Marital status: Divorced    Spouse name: Not on file   Number of children: 2   Years of education: 12   Highest education level: 12th grade  Occupational History   Occupation: Scientist, research (medical)    Comment: retired  Tobacco Use   Smoking status: Never   Smokeless  tobacco: Never  Scientific laboratory technician Use: Never used  Substance and Sexual Activity   Alcohol use: Never   Drug use: No   Sexual activity: Never  Other Topics Concern   Not on file  Social History Narrative   Lives with daughter and son in law. She has two children. Due to her neuropathy, she is bed bound and wheel chair bound. She watches television throughout the day.   Social Determinants of Health   Financial Resource Strain: Low Risk  (01/09/2022)   Overall Financial Resource Strain (CARDIA)    Difficulty of Paying Living Expenses: Not hard at all  Food Insecurity: No Food Insecurity (01/09/2022)   Hunger Vital Sign    Worried About Running Out of Food in the Last Year: Never true    Ran Out of Food in the Last Year: Never true  Transportation Needs: No Transportation Needs (01/09/2022)   PRAPARE - Hydrologist (Medical): No    Lack of Transportation (Non-Medical): No  Physical Activity: Inactive (01/09/2022)   Exercise Vital Sign    Days of Exercise per Week: 0 days    Minutes of Exercise  per Session: 0 min  Stress: No Stress Concern Present (01/09/2022)   [place]    Feeling of Stress : Not at all  Social Connections: Socially Isolated (01/09/2022)   Social Connection and Isolation Panel [NHANES]    Frequency of Communication with Friends and Family: Three times a week    Frequency of Social Gatherings with Friends and Family: More than three times a week    Attends Religious Services: Never    Marine scientist or Organizations: No    Attends Archivist Meetings: Never    Marital Status: Divorced  Human resources officer Violence: Not At Risk (01/09/2022)   Humiliation, Afraid, Rape, and Kick questionnaire    Fear of Current or Ex-Partner: No    Emotionally Abused: No    Physically Abused: No    Sexually Abused: No    Outpatient Medications Prior to Visit   Medication Sig Dispense Refill   Cholecalciferol 25 MCG (1000 UT) capsule Take 1 capsule by mouth daily.     cyclobenzaprine (FLEXERIL) 10 MG tablet Take 10 mg by mouth 3 (three) times daily as needed. (Patient not taking: Reported on 05/06/2022)     esomeprazole (NEXIUM) 20 MG capsule Take 1 capsule (20 mg total) by mouth daily before breakfast. Take 30 minutes before breakfast 90 capsule 1   HYDROcodone-acetaminophen (NORCO/VICODIN) 5-325 MG tablet Take 1 tablet by mouth every 6 (six) hours as needed.     magnesium oxide (MAG-OX) 400 (240 Mg) MG tablet Take 1 tablet by mouth daily. (Patient not taking: Reported on 04/01/2022)     Multiple Vitamin (MULTIVITAMIN) tablet Take 1 tablet by mouth daily.     NITROFURANTOIN PO Take 50 mg by mouth daily.     ondansetron (ZOFRAN) 8 MG tablet Take by mouth.     gabapentin (NEURONTIN) 300 MG capsule Take 300 mg by mouth in the morning, at noon, and at bedtime. Patient takes 2 capsules AM, 2 capsules in afternoon, and 3 capsules in the evening     oxybutynin (DITROPAN) 5 MG tablet Take 1 tablet by mouth twice daily 60 tablet 5   No facility-administered medications prior to visit.    Allergies  Allergen Reactions   Latex Itching and Rash    Review of Systems     Objective:    Physical Exam Vitals reviewed.  Constitutional:      Appearance: She is well-developed.  HENT:     Head: Normocephalic and atraumatic.  Eyes:     Conjunctiva/sclera: Conjunctivae normal.  Cardiovascular:     Rate and Rhythm: Normal rate.  Pulmonary:     Effort: Pulmonary effort is normal.  Musculoskeletal:     Comments: Feet have a dusky appearance to it.  There is some erythema at the base of the left great toenail and the lateral portion of the nail border looks thick and dark and friable.  Skin:    General: Skin is dry.     Coloration: Skin is not pale.     Comments: Feet are extremely sensitive to touch.  Neurological:     Mental Status: She is alert and  oriented to person, place, and time.  Psychiatric:        Behavior: Behavior normal.     BP 111/65   Pulse (!) 106   Ht '5\' 5"'  (1.651 m)   Wt 149 lb (67.6 kg)   SpO2 98%   BMI 24.79 kg/m  Wt Readings from  Last 3 Encounters:  02/19/22 149 lb (67.6 kg)  10/01/21 165 lb (74.8 kg)  04/21/21 145 lb (65.8 kg)    Health Maintenance Due  Topic Date Due   URINE MICROALBUMIN  Never done   Zoster Vaccines- Shingrix (1 of 2) Never done    There are no preventive care reminders to display for this patient.   Lab Results  Component Value Date   TSH 3.70 03/05/2020   Lab Results  Component Value Date   WBC 4.8 02/19/2022   HGB 13.8 02/19/2022   HCT 42.8 02/19/2022   MCV 87.2 02/19/2022   PLT 170 02/19/2022   Lab Results  Component Value Date   NA 140 02/19/2022   K 4.2 02/19/2022   CO2 27 02/19/2022   GLUCOSE 110 (H) 02/19/2022   BUN 25 02/19/2022   CREATININE 1.19 (H) 02/19/2022   BILITOT 0.5 02/19/2022   ALKPHOS 81 02/19/2017   AST 17 02/19/2022   ALT 12 02/19/2022   PROT 7.7 02/19/2022   ALBUMIN 4.2 02/19/2017   CALCIUM 9.9 02/19/2022   EGFR 49 (L) 02/19/2022   Lab Results  Component Value Date   CHOL 174 04/22/2021   Lab Results  Component Value Date   HDL 39 (L) 04/22/2021   Lab Results  Component Value Date   LDLCALC 108 (H) 04/22/2021   Lab Results  Component Value Date   TRIG 157 (H) 04/22/2021   Lab Results  Component Value Date   CHOLHDL 4.5 04/22/2021   Lab Results  Component Value Date   HGBA1C 5.0 08/04/2021       Assessment & Plan:   Problem List Items Addressed This Visit       Cardiovascular and Mediastinum   HYPERTENSION, BENIGN SYSTEMIC   Relevant Orders   AMB Referral to [facility] Coordinaton     Musculoskeletal and Integument   Onychomycosis of left great toe    Toenail looks consistent with onychomycosis.  We will check liver function if normal then we can start oral Lamisil.      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  We may need to adjust the dose on Lyrica but we can at least start there.  If that is not helpful then consider amitriptyline or nortriptyline or even Cymbalta.   Has tried 20m of Cymbalta but never went higher.       Relevant Orders   COMPLETE METABOLIC PANEL WITH GFR (Completed)   CBC (Completed)   Bilateral cold feet   Cold feet-just encouraged her to really try to keep them warm is much as possible.  But we also discussed possibly considering a trial of a calcium channel blocker to see if this helps as well.  For now I want to focus on switching her medications for her peripheral neuropathy but we can circle back to the calcium channel blocker as well.   Meds ordered this encounter  Medications  pregabalin (LYRICA) 150 MG capsule    Sig: Take 1 capsule (150 mg total) by mouth 2 (two) times daily.    Dispense:  60 capsule    Refill:  1   terbinafine (LAMISIL) 250 MG tablet    Sig: Take 1 tablet (250 mg total) by mouth daily. X 12 weeks    Dispense:  90 tablet    Refill:  1     [name], MD

## 2022-02-19 NOTE — Assessment & Plan Note (Addendum)
We discussed some different options since the gabapentin does not seem to be helping we will taper that down and switch to Lyrica.  We may need to adjust the dose on Lyrica but we can at least start there.  If that is not helpful then consider amitriptyline or nortriptyline or even Cymbalta. ? ? ?Has tried 30mg  of Cymbalta but never went higher.  ?

## 2022-02-19 NOTE — Assessment & Plan Note (Signed)
Toenail looks consistent with onychomycosis.  We will check liver function if normal then we can start oral Lamisil. ?

## 2022-02-20 LAB — COMPLETE METABOLIC PANEL WITH GFR
AG Ratio: 1.4 (calc) (ref 1.0–2.5)
ALT: 12 U/L (ref 6–29)
AST: 17 U/L (ref 10–35)
Albumin: 4.5 g/dL (ref 3.6–5.1)
Alkaline phosphatase (APISO): 93 U/L (ref 37–153)
BUN/Creatinine Ratio: 21 (calc) (ref 6–22)
BUN: 25 mg/dL (ref 7–25)
CO2: 27 mmol/L (ref 20–32)
Calcium: 9.9 mg/dL (ref 8.6–10.4)
Chloride: 102 mmol/L (ref 98–110)
Creat: 1.19 mg/dL — ABNORMAL HIGH (ref 0.60–1.00)
Globulin: 3.2 g/dL (calc) (ref 1.9–3.7)
Glucose, Bld: 110 mg/dL — ABNORMAL HIGH (ref 65–99)
Potassium: 4.2 mmol/L (ref 3.5–5.3)
Sodium: 140 mmol/L (ref 135–146)
Total Bilirubin: 0.5 mg/dL (ref 0.2–1.2)
Total Protein: 7.7 g/dL (ref 6.1–8.1)
eGFR: 49 mL/min/{1.73_m2} — ABNORMAL LOW (ref >=60)

## 2022-02-20 LAB — CBC
HCT: 42.8 % (ref 35.0–45.0)
Hemoglobin: 13.8 g/dL (ref 11.7–15.5)
MCH: 28.1 pg (ref 27.0–33.0)
MCHC: 32.2 g/dL (ref 32.0–36.0)
MCV: 87.2 fL (ref 80.0–100.0)
MPV: 11.1 fL (ref 7.5–12.5)
Platelets: 170 10*3/uL (ref 140–400)
RBC: 4.91 10*6/uL (ref 3.80–5.10)
RDW: 16.7 % — ABNORMAL HIGH (ref 11.0–15.0)
WBC: 4.8 10*3/uL (ref 3.8–10.8)

## 2022-02-20 NOTE — Progress Notes (Signed)
Your lab work is within acceptable range and there are no concerning findings.   ?

## 2022-02-25 ENCOUNTER — Ambulatory Visit (INDEPENDENT_AMBULATORY_CARE_PROVIDER_SITE_OTHER): Payer: Medicare HMO

## 2022-02-25 DIAGNOSIS — M792 Neuralgia and neuritis, unspecified: Secondary | ICD-10-CM

## 2022-02-25 DIAGNOSIS — I10 Essential (primary) hypertension: Secondary | ICD-10-CM

## 2022-02-25 DIAGNOSIS — E785 Hyperlipidemia, unspecified: Secondary | ICD-10-CM

## 2022-02-25 NOTE — Chronic Care Management (AMB) (Signed)
Chronic Care Management   CCM RN Visit Note  02/25/2022 Name: Amy Wilson MRN: [id number] DOB: 1951-10-06  Subjective: Amy Wilson is a 71 y.o. year old female who is a primary care patient of Metheney, Rene Kocher, MD. The care management team was consulted for assistance with disease management and care coordination needs.    Engaged with patient by telephone for follow up visit in response to provider referral for case management and/or care coordination services.   Consent to Services:  The patient was given information about Chronic Care Management services, agreed to services, and gave verbal consent prior to initiation of services.  Please see initial visit note for detailed documentation.   Patient agreed to services and verbal consent obtained.   Assessment: Review of patient past medical history, allergies, medications, health status, including review of consultants reports, laboratory and other test data, was performed as part of comprehensive evaluation and provision of chronic care management services.   SDOH (Social Determinants of Health) assessments and interventions performed:    CCM Care Plan  Allergies  Allergen Reactions   Latex Itching and Rash    Outpatient Encounter Medications as of 02/25/2022  Medication Sig Note   Cholecalciferol 25 MCG (1000 UT) capsule Take 1 capsule by mouth daily.    cyclobenzaprine (FLEXERIL) 10 MG tablet Take 10 mg by mouth 3 (three) times daily as needed.    esomeprazole (NEXIUM) 20 MG capsule Take 1 capsule (20 mg total) by mouth daily before breakfast. Take 30 minutes before breakfast    HYDROcodone-acetaminophen (NORCO/VICODIN) 5-325 MG tablet Take 1 tablet by mouth every 6 (six) hours as needed.    magnesium oxide (MAG-OX) 400 (240 Mg) MG tablet Take 1 tablet by mouth daily.    Multiple Vitamin (MULTIVITAMIN) tablet Take 1 tablet by mouth daily.    NITROFURANTOIN PO Take 50 mg by mouth daily.    ondansetron (ZOFRAN) 8 MG  tablet Take by mouth. 06/11/2021: Reports takes prn   oxybutynin (DITROPAN) 5 MG tablet Take 1 tablet by mouth twice daily    pregabalin (LYRICA) 150 MG capsule Take 1 capsule (150 mg total) by mouth 2 (two) times daily.    terbinafine (LAMISIL) 250 MG tablet Take 1 tablet (250 mg total) by mouth daily. X 12 weeks    No facility-administered encounter medications on file as of 02/25/2022.    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   Left leg weakness 06/02/2021   Recurrent UTI 11/27/2020   Neutropenia associated with infection ([place]) 11/23/2020   Mixed stress and urge urinary incontinence 10/22/2020   Generalized weakness 10/22/2020   Pancytopenia due to antineoplastic chemotherapy ([place]) 10/13/2020   Cerebellar ataxia in diseases classified elsewhere ([place]) 08/08/2020   Retroperitoneal lymphadenopathy 08/08/2020   Peripheral neuropathic pain 07/22/2020   GERD (gastroesophageal reflux disease) 07/03/2020   Stage 3b chronic kidney disease ([place]) 07/03/2020   Bilateral ureteral obstruction 05/27/2020   Hydronephrosis 05/18/2019   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[place]) 05/03/2019   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[place]) 05/03/2019   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/03/2019   Closed compression fracture of body of L1 vertebra ([place]) 02/24/2019   Age-related osteoporosis with current pathological fracture with routine healing 02/24/2019   Moderate to severe pulmonary hypertension ([place]) 02/21/2019   Pelvic mass 02/20/2019   Hiatal hernia  02/20/2019   Fibroid uterus 02/20/2019   Bladder wall thickening 02/20/2019   Aortic atherosclerosis ([place]) 02/20/2019   Obesity (BMI 30-39.9) 10/05/2014   ARM PAIN, LEFT 10/13/2010   INSOMNIA 05/13/2009   DIZZINESS 09/13/2007   Hyperlipidemia 08/19/2007   Depression,  recurrent ([place]) 09/28/2006   HYPERTENSION, BENIGN SYSTEMIC 09/28/2006   MENOPAUSAL SYNDROME 09/28/2006   VASOVAGAL SYNCOPE 09/28/2006   FATIGUE/MALAISE 09/28/2006    Conditions to be addressed/monitored:HTN, HLD, and Peripheral Neuropathy  Care Plan : RN Care Manager Plan of Care  Updates made by Luretha Rued, RN since 02/25/2022 12:00 AM     Problem: Chronic Disease Management education and/or Care Coordination needs   Priority: High     Long-Range Goal: Development of Plan of Care for Chronic Disease Management and/or Care Coordination Needs   Start Date: 01/28/2022  Expected End Date: 07/28/2022  Priority: High  Note:   Current Barriers: Ms. Sabas reports she is improving. She reports she has obtained an new electric wheelchair, she is active with home health physical therapy and states she walks a couple of steps to her wheelchair. She reiterates she is working on independence by going into the kitchen and preparing her own lunch/breakfast using microwave. Reports although peripheral neuropathy to feet unchanged, there is overall improvement since change from Neurontin to Lyrica. She denies any questions or concerns at this time.   Knowledge Deficits related to plan of care for management of HTN, HLD, and fall prevention  Chronic Disease Management support and education needs related to HTN, HLD, and fall prevention  RNCM Clinical Goal(s):  Patient will verbalize understanding of plan for management of HTN, HLD, and fall prevention as evidenced by self report and/or chart notation demonstrate ongoing adherence to prescribed treatment plan for HTN, HLD, and fall prevention as evidenced by blood pressure controlled, taking medications as prescribed, attending provider appointment as scheduled  through collaboration with RN Care manager, provider, and care team.   Interventions: 1:1 collaboration with primary care provider regarding development and update of comprehensive plan of care  as evidenced by provider attestation and co-signature Inter-disciplinary care team collaboration (see longitudinal plan of care) Evaluation of current treatment plan related to  self management and patient's adherence to plan as established by provider  Peripheral Neuropathy/Fall risk Interventions  (Status:  Goal on track:  Yes.)  Long Term Goal Reinforced the importance of following recommendations from physical therapist.  Medications reviewed and encouraged to take as prescribed Provided positive feedback regarding self health management  Hyperlipidemia Interventions:  (Status:  Goal on track:  Yes.) Long Term Goal Per chart: last Lipid level done on 04/22/21: Cholesterol 174; HDL 39; Triglyceride 157; LDL 108 Medication review performed; medication list updated in electronic medical record.  Encouraged to continue healthy eating  Hypertension Interventions:  (Status:  Goal on track:  Yes.) Long Term Goal Per Patient: Home BP reading taken 116/60 and 120/80 Last practice recorded BP readings:  BP Readings from Last 3 Encounters:  02/19/22 111/65  10/01/21 121/66  08/04/21 110/63  Most recent eGFR/CrCl:  Lab Results  Component Value Date   EGFR 49 (L) 02/19/2022    No components found for: CRCL Discussed blood pressure readings Encouraged to continue to attend provider visit as scheduled Encouraged patient to continue to monitor salt intake and eat healthy   Patient Goals/Self-Care Activities: Take medications as prescribed   Attend all scheduled provider appointments Call provider office for new concerns or questions  eat more whole grains, fruits and  vegetables, lean meats and healthy fats Continue to follow home health physical therapist instructions and complete exercises as recommended Continue check and record blood pressure and take with you to provider visits Continue to maintain good skin care Continue to work with care management clinical team to address health care  and disease management related needs   Plan:Telephone follow up appointment with care management team member scheduled for:  04/01/22 The patient has been provided with contact information for the care management team and has been advised to call with any health related questions or concerns.   [name], RN, MSN, BSN, CCM Care Management Coordinator [facility] [phone]

## 2022-02-25 NOTE — Patient Instructions (Signed)
Visit Information ? ?Thank you for taking time to visit with me today. Please don't hesitate to contact me if I can be of assistance to you before our next scheduled telephone appointment. ? ?Following are the goals we discussed today:  ?Patient Goals/Self-Care Activities: ?Take medications as prescribed   ?Attend all scheduled provider appointments ?Call provider office for new concerns or questions  ?eat more whole grains, fruits and vegetables, lean meats and healthy fats ?Continue to follow home health physical therapist instructions and complete exercises as recommended ?Continue check and record blood pressure and take with you to provider visits ?Continue to maintain good skin care ?Continue to work with care management clinical team to address health care and disease management related needs) ? ?Our next appointment is by telephone on 04/01/22 at 2:00 pm ? ?Please call the care guide team at [phone] if you need to cancel or reschedule your appointment.  ? ?If you are experiencing a Mental Health or [place] or need someone to talk to, please call the Suicide and Crisis Lifeline: 988 ?call 1-800-273-TALK (toll free, 24 hour hotline)  ? ?Patient verbalizes understanding of instructions and care plan provided today and agrees to view in [place]. Active MyChart status confirmed with patient.   ? ?[name], RN, MSN, BSN, CCM ?Care Management Coordinator ?[facility] ?[phone]  ?

## 2022-03-11 ENCOUNTER — Other Ambulatory Visit: Payer: Self-pay

## 2022-03-11 DIAGNOSIS — F339 Major depressive disorder, recurrent, unspecified: Secondary | ICD-10-CM

## 2022-03-16 ENCOUNTER — Encounter: Payer: Self-pay | Admitting: Family Medicine

## 2022-03-16 NOTE — Telephone Encounter (Signed)
Ok to put her on nurse schedule and I can see her.   ?

## 2022-03-17 ENCOUNTER — Other Ambulatory Visit: Payer: Self-pay

## 2022-03-17 ENCOUNTER — Telehealth: Payer: Self-pay

## 2022-03-17 DIAGNOSIS — R29898 Other symptoms and signs involving the musculoskeletal system: Secondary | ICD-10-CM

## 2022-03-17 DIAGNOSIS — R309 Painful micturition, unspecified: Secondary | ICD-10-CM | POA: Diagnosis not present

## 2022-03-17 NOTE — Telephone Encounter (Signed)
Per Dr Madilyn Fireman, urine culture ordered. ?

## 2022-03-17 NOTE — Telephone Encounter (Signed)
Freda Munro PT with [facility] called requesting for new referral for patient for reassessment next week. Freda Munro stated patient declined to be seen for the rest of the week due to not feeling well. New referral placed.  ?

## 2022-03-18 ENCOUNTER — Ambulatory Visit: Payer: Medicare HMO | Admitting: Family Medicine

## 2022-03-20 ENCOUNTER — Other Ambulatory Visit: Payer: Self-pay | Admitting: Family Medicine

## 2022-03-20 DIAGNOSIS — E785 Hyperlipidemia, unspecified: Secondary | ICD-10-CM | POA: Diagnosis not present

## 2022-03-20 DIAGNOSIS — I10 Essential (primary) hypertension: Secondary | ICD-10-CM | POA: Diagnosis not present

## 2022-03-20 MED ORDER — CIPROFLOXACIN HCL 500 MG PO TABS: 500.0000 mg | ORAL_TABLET | Freq: Two times a day (BID) | ORAL | 0 refills | Status: AC

## 2022-03-20 NOTE — Progress Notes (Signed)
Call pt: not sure if she went to ED or not. I don't see any notes.  I am sending over an antibiotic though the sensitives are not back.

## 2022-03-20 NOTE — Progress Notes (Signed)
Meds ordered this encounter  ?Medications  ? ciprofloxacin (CIPRO) 500 MG tablet  ?  Sig: Take 1 tablet (500 mg total) by mouth 2 (two) times daily for 3 days.  ?  Dispense:  10 tablet  ?  Refill:  0  ? ? ?

## 2022-03-21 LAB — URINE CULTURE
MICRO NUMBER:: 13190529
SPECIMEN QUALITY:: ADEQUATE

## 2022-03-21 MED ORDER — PHENAZOPYRIDINE HCL 200 MG PO TABS: 200.0000 mg | ORAL_TABLET | Freq: Three times a day (TID) | ORAL | 0 refills | Status: AC

## 2022-03-21 NOTE — Telephone Encounter (Signed)
On call page, patient history of ureteral stenting with lots of pain, dropped off urine this week.  didn't know PCP called in antibiotic.  Culture showing GNRs, likely ecoli or other enterobacteriacea. Flouroquinolones usually have good MICs but will monitor for speciation and sensitivities. Adding pyridium for immediate relief, will hydrate aggressively and go ahead and pick up her cipro. ?

## 2022-03-22 NOTE — Progress Notes (Signed)
Culture shows bacteria that should respond well to Cipro antibiotic.  Make sure to complete the antibiotic.

## 2022-04-01 ENCOUNTER — Ambulatory Visit (INDEPENDENT_AMBULATORY_CARE_PROVIDER_SITE_OTHER): Payer: Medicare HMO

## 2022-04-01 DIAGNOSIS — E785 Hyperlipidemia, unspecified: Secondary | ICD-10-CM

## 2022-04-01 DIAGNOSIS — R399 Unspecified symptoms and signs involving the genitourinary system: Secondary | ICD-10-CM | POA: Diagnosis not present

## 2022-04-01 DIAGNOSIS — I10 Essential (primary) hypertension: Secondary | ICD-10-CM

## 2022-04-01 NOTE — Chronic Care Management (AMB) (Signed)
?Chronic Care Management  ? ?CCM RN Visit Note ? ?04/01/2022 ?Wilson: Amy Wilson MRN: [id number] DOB: 1951-01-31 ? ?Subjective: ?Amy Wilson is a 71 y.o. year old female who is a primary care patient of Metheney, Rene Kocher, MD. The care management team was consulted for assistance with disease management and care coordination needs.   ? ?Engaged with patient by telephone for follow up visit in response to provider referral for case management and/or care coordination services.  ? ?Consent to Services:  ?The patient was given information about Chronic Care Management services, agreed to services, and gave verbal consent prior to initiation of services.  Please see initial visit note for detailed documentation.  ? ?Patient agreed to services and verbal consent obtained.  ? ?Assessment: Review of patient past medical history, allergies, medications, health status, including review of consultants reports, laboratory and other test data, was performed as part of comprehensive evaluation and provision of chronic care management services.  ? ?SDOH (Social Determinants of Health) assessments and interventions performed:   ? ?CCM Care Plan ? ?Allergies  ?Allergen Reactions  ? Latex Itching and Rash  ? ? ?Outpatient Encounter Medications as of 04/01/2022  ?Medication Sig Note  ? cyclobenzaprine (FLEXERIL) 10 MG tablet Take 10 mg by mouth 3 (three) times daily as needed.   ? esomeprazole (NEXIUM) 20 MG capsule Take 1 capsule (20 mg total) by mouth daily before breakfast. Take 30 minutes before breakfast 04/01/2022: Reports takes as needed  ? HYDROcodone-acetaminophen (NORCO/VICODIN) 5-325 MG tablet Take 1 tablet by mouth every 6 (six) hours as needed.   ? Multiple Vitamin (MULTIVITAMIN) tablet Take 1 tablet by mouth daily.   ? ondansetron (ZOFRAN) 8 MG tablet Take by mouth. 06/11/2021: Reports takes prn  ? oxybutynin (DITROPAN) 5 MG tablet Take 1 tablet by mouth twice daily   ? Cholecalciferol 25 MCG (1000 UT) capsule Take  1 capsule by mouth daily. (Patient not taking: Reported on 04/01/2022)   ? magnesium oxide (MAG-OX) 400 (240 Mg) MG tablet Take 1 tablet by mouth daily. (Patient not taking: Reported on 04/01/2022)   ? NITROFURANTOIN PO Take 50 mg by mouth daily. (Patient not taking: Reported on 04/01/2022)   ? pregabalin (LYRICA) 150 MG capsule Take 1 capsule (150 mg total) by mouth 2 (two) times daily. (Patient not taking: Reported on 04/01/2022)   ? terbinafine (LAMISIL) 250 MG tablet Take 1 tablet (250 mg total) by mouth daily. X 12 weeks (Patient not taking: Reported on 04/01/2022)   ? ?No facility-administered encounter medications on file as of 04/01/2022.  ? ? ?Patient Active Problem List  ? Diagnosis Date Noted  ? Bilateral cold feet 02/19/2022  ? Onychomycosis of left great toe 02/19/2022  ? History of resection of meningioma 10/01/2021  ? 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 Left leg weakness 06/02/2021  ? Recurrent UTI 11/27/2020  ? Neutropenia associated with infection ([place]) 11/23/2020  ? Mixed stress and urge urinary incontinence 10/22/2020  ? Generalized weakness 10/22/2020  ? Pancytopenia due to antineoplastic chemotherapy ([place]) 10/13/2020  ? Cerebellar ataxia in diseases classified elsewhere ([place]) 08/08/2020  ? Retroperitoneal lymphadenopathy 08/08/2020  ? Peripheral neuropathic pain 07/22/2020  ? GERD (gastroesophageal reflux disease) 07/03/2020  ? Stage 3b chronic kidney disease ([place]) 07/03/2020  ? Bilateral ureteral obstruction 05/27/2020  ? Hydronephrosis 05/18/2019  ? 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[place]) 05/03/2019  ? 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 Closed compression fracture of body of  L1 vertebra ([place]) 02/24/2019  ? Age-related osteoporosis with current pathological fracture with routine healing 02/24/2019  ? Moderate to severe pulmonary hypertension ([place]) 02/21/2019  ? Pelvic mass 02/20/2019  ? Hiatal hernia 02/20/2019  ? Fibroid  uterus 02/20/2019  ? Bladder wall thickening 02/20/2019  ? Aortic atherosclerosis ([place]) 02/20/2019  ? Obesity (BMI 30-39.9) 10/05/2014  ? ARM PAIN, LEFT 10/13/2010  ? INSOMNIA 05/13/2009  ? DIZZINESS 09/13/2007  ? Hyperlipidemia 08/19/2007  ? Depression, recurrent ([place]) 09/28/2006  ? HYPERTENSION, BENIGN SYSTEMIC 09/28/2006  ? MENOPAUSAL SYNDROME 09/28/2006  ? VASOVAGAL SYNCOPE 09/28/2006  ? FATIGUE/MALAISE 09/28/2006  ? ? ?Conditions to be addressed/monitored:HTN, HLD, and Neuropathy ? ?Care Plan : RN Care Manager Plan of Care  ?Updates made by Luretha Rued, RN since 04/01/2022 12:00 AM  ?  ? ?Problem: Chronic Disease Management education and/or Care Coordination needs   ?Priority: High  ?  ? ?Long-Range Goal: Development of Plan of Care for Chronic Disease Management and/or Care Coordination Needs   ?Start Date: 01/28/2022  ?Expected End Date: 07/28/2022  ?Priority: High  ?Note:   ?Current Barriers:    ?Knowledge Deficits related to plan of care for management of HTN, HLD, and fall prevention  ?Chronic Disease Management support and education needs related to HTN, HLD, and fall prevention ? ?RNCM Clinical Goal(s):  ?Patient will verbalize understanding of plan for management of HTN, HLD, and fall prevention as evidenced by self report and/or chart notation ?demonstrate ongoing adherence to prescribed treatment plan for HTN, HLD, and fall prevention as evidenced by blood pressure controlled, taking medications as prescribed, attending provider appointment as scheduled  through collaboration with RN Care manager, provider, and care team.  ? ?Interventions: ?1:1 collaboration with primary care provider regarding development and update of comprehensive plan of care as evidenced by provider attestation and co-signature ?Inter-disciplinary care team collaboration (see longitudinal plan of care) ?Evaluation of current treatment plan related to  self management and patient's adherence to plan as established by  provider ? ?02/25/22 Amy Wilson reports she is improving. She reports she has obtained an new electric wheelchair, she is active with home health physical therapy and states she walks a couple of steps to her wheelchair. She reiterates she is working on independence by going into the kitchen and preparing her own lunch/breakfast using microwave. Reports although peripheral neuropathy to feet unchanged, there is overall improvement since change from Neurontin to Lyrica. She denies any questions or concerns at this time.  ?04/01/22 Amy Wilson reports primary concern today is UTI. She reports her son-in law has taken a urine sample to her urologist today and she is awaiting to hear what antibiotic she needs to be taking. Medications reviewed. She reports that Pregabalin was started for neuropathy, but made it  made her "Marcille Buffy" and it decreased her appetite, so she stopped taking the Pregabalin. She reports she has decided not to take anything for the neuropathy and informed PCP. Reports she is awaiting a call from urologist today for further direction regarding UTI.   ? ?Peripheral Neuropathy/Fall risk Interventions  (Status:  Goal on track:  Yes.)  Long Term Goal ?Reinforced the importance of notifying provider of falls ?Medications reviewed and encouraged to take as prescribed ?Encouraged to contact provider as needed ? ?Hyperlipidemia Interventions:  (Status:  Goal on track:  Yes.) Long Term Goal ?Per chart: last Lipid level done on 04/22/21: Cholesterol 174; HDL 39; Triglyceride 157; LDL 108 ?Medication review performed; medication list updated in electronic medical record.  ?  Encouraged to continue healthy eating ?Encouraged to attend provider visit as scheduled ? ?Hypertension Interventions:  (Status:  Goal on track:  Yes.) Long Term Goal ?Per Patient: Home BP reading have been within normal range-BP 120/60 when last taken. ?Last practice recorded BP readings:  ?BP Readings from Last 3 Encounters:  ?02/19/22 111/65   ?10/01/21 121/66  ?08/04/21 110/63  ?Most recent eGFR/CrCl:  ?Lab Results  ?Component Value Date  ? EGFR 49 (L) 02/19/2022  ?  No components found for: CRCL ?Discussed blood pressure readings ?Encouraged to continue

## 2022-04-01 NOTE — Patient Instructions (Addendum)
Visit Information ? ?Thank you for taking time to visit with me today. Please don't hesitate to contact me if I can be of assistance to you before our next scheduled telephone appointment. ? ?Following are the goals we discussed today:  ?Patient Goals/Self-Care Activities: ?Take medications as prescribed   ?Attend all scheduled provider appointments ?Call provider office for new concerns or questions  ?eat more whole grains, fruits and vegetables, lean meats and healthy fats ?Continue keep track of your blood pressure. Notify provider if it is out of your normal range ?Continue to maintain good skin care ?Continue to work with care management clinical team to address health care and disease management related needs ? ?Our next appointment is by telephone on 05/06/22 at 2:00 pm ? ?Please call the care guide team at [phone] if you need to cancel or reschedule your appointment.  ? ?If you are experiencing a Mental Health or [place] or need someone to talk to, please call the Suicide and Crisis Lifeline: 988 ?call 1-800-273-TALK (toll free, 24 hour hotline)  ? ?Patient verbalizes understanding of instructions and care plan provided today and agrees to view in [place]. Active MyChart status confirmed with patient.   ? ?[name], RN, MSN, BSN, CCM ?Care Management Coordinator ?[facility] ?[phone]  ? ?

## 2022-04-14 DIAGNOSIS — R399 Unspecified symptoms and signs involving the genitourinary system: Secondary | ICD-10-CM | POA: Diagnosis not present

## 2022-04-19 DIAGNOSIS — I10 Essential (primary) hypertension: Secondary | ICD-10-CM

## 2022-04-19 DIAGNOSIS — E785 Hyperlipidemia, unspecified: Secondary | ICD-10-CM

## 2022-04-28 DIAGNOSIS — Z48811 Encounter for surgical aftercare following surgery on the nervous system: Secondary | ICD-10-CM | POA: Diagnosis not present

## 2022-04-28 DIAGNOSIS — Z86018 Personal history of other benign neoplasm: Secondary | ICD-10-CM | POA: Diagnosis not present

## 2022-04-30 ENCOUNTER — Ambulatory Visit (HOSPITAL_COMMUNITY): Payer: Self-pay | Admitting: Psychiatry

## 2022-04-30 ENCOUNTER — Other Ambulatory Visit: Payer: Self-pay | Admitting: Family Medicine

## 2022-04-30 DIAGNOSIS — R351 Nocturia: Secondary | ICD-10-CM

## 2022-04-30 DIAGNOSIS — N3946 Mixed incontinence: Secondary | ICD-10-CM

## 2022-05-06 ENCOUNTER — Ambulatory Visit (INDEPENDENT_AMBULATORY_CARE_PROVIDER_SITE_OTHER): Payer: Medicare HMO

## 2022-05-06 DIAGNOSIS — I10 Essential (primary) hypertension: Secondary | ICD-10-CM

## 2022-05-06 DIAGNOSIS — E785 Hyperlipidemia, unspecified: Secondary | ICD-10-CM

## 2022-05-06 NOTE — Patient Instructions (Addendum)
Visit Information ? ?Thank you for taking time to visit with me today. Please don't hesitate to contact me if I can be of assistance to you before our next scheduled telephone appointment. ? ?Following are the goals we discussed today:  ?Patient Goals/Self-Care Activities: ?Continue to attend provider visits as recommended/scheduled ?Continue to participate in physical therapy sessions and perform exercises as recommended ?eat more whole grains, fruits and vegetables, lean meats and healthy fats ?Continue keep track of your blood pressure. Notify provider if it is out of your normal range ?Continue to maintain good skin care ?Continue to work with care management clinical team to address health care and disease management related needs ? ?Our next appointment is by telephone on 06/10/22 at 1:00 pm ? ?Please call the care guide team at [phone] if you need to cancel or reschedule your appointment.  ? ?If you are experiencing a Mental Health or [place] or need someone to talk to, please call the Suicide and Crisis Lifeline: 988 ?call 1-800-273-TALK (toll free, 24 hour hotline)  ? ?Patient verbalizes understanding of instructions and care plan provided today and agrees to view in [place]. Active MyChart status and patient understanding of how to access instructions and care plan via MyChart confirmed with patient.    ? ?[name], RN, MSN, BSN, CCM ?Care Management Coordinator ?[facility] ?[phone]  ?

## 2022-05-06 NOTE — Chronic Care Management (AMB) (Signed)
?Chronic Care Management  ? ?CCM RN Visit Note ? ?05/06/2022 ?Name: Amy Wilson MRN: [id number] DOB: 1951-12-09 ? ?Subjective: ?Amy Wilson is a 71 y.o. year old female who is a primary care patient of Metheney, Rene Kocher, MD. The care management team was consulted for assistance with disease management and care coordination needs.   ? ?Engaged with patient by telephone for follow up visit in response to provider referral for case management and/or care coordination services.  ? ?Consent to Services:  ?The patient was given information about Chronic Care Management services, agreed to services, and gave verbal consent prior to initiation of services.  Please see initial visit note for detailed documentation.  ? ?Patient agreed to services and verbal consent obtained.  ? ?Assessment: Review of patient past medical history, allergies, medications, health status, including review of consultants reports, laboratory and other test data, was performed as part of comprehensive evaluation and provision of chronic care management services.  ? ?SDOH (Social Determinants of Health) assessments and interventions performed:   ? ?CCM Care Plan ? ?Allergies  ?Allergen Reactions  ? Latex Itching and Rash  ? ? ?Outpatient Encounter Medications as of 05/06/2022  ?Medication Sig Note  ? Cholecalciferol 25 MCG (1000 UT) capsule Take 1 capsule by mouth daily. 05/06/2022: Reports taking 2000 IU daily  ? esomeprazole (NEXIUM) 20 MG capsule Take 1 capsule (20 mg total) by mouth daily before breakfast. Take 30 minutes before breakfast 04/01/2022: Reports takes as needed  ? HYDROcodone-acetaminophen (NORCO/VICODIN) 5-325 MG tablet Take 1 tablet by mouth every 6 (six) hours as needed.   ? Multiple Vitamin (MULTIVITAMIN) tablet Take 1 tablet by mouth daily.   ? NITROFURANTOIN PO Take 50 mg by mouth daily.   ? ondansetron (ZOFRAN) 8 MG tablet Take by mouth. 06/11/2021: Reports takes prn  ? oxybutynin (DITROPAN) 5 MG tablet Take 1 tablet by  mouth twice daily   ? cyclobenzaprine (FLEXERIL) 10 MG tablet Take 10 mg by mouth 3 (three) times daily as needed. (Patient not taking: Reported on 05/06/2022)   ? magnesium oxide (MAG-OX) 400 (240 Mg) MG tablet Take 1 tablet by mouth daily. (Patient not taking: Reported on 04/01/2022)   ? pregabalin (LYRICA) 150 MG capsule Take 1 capsule (150 mg total) by mouth 2 (two) times daily. (Patient not taking: Reported on 04/01/2022)   ? terbinafine (LAMISIL) 250 MG tablet Take 1 tablet (250 mg total) by mouth daily. X 12 weeks (Patient not taking: Reported on 04/01/2022)   ? ?No facility-administered encounter medications on file as of 05/06/2022.  ? ? ?Patient Active Problem List  ? Diagnosis Date Noted  ? Bilateral cold feet 02/19/2022  ? Onychomycosis of left great toe 02/19/2022  ? History of resection of meningioma 10/01/2021  ? 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 Left leg weakness 06/02/2021  ? Recurrent UTI 11/27/2020  ? Neutropenia associated with infection ([place]) 11/23/2020  ? Mixed stress and urge urinary incontinence 10/22/2020  ? Generalized weakness 10/22/2020  ? Pancytopenia due to antineoplastic chemotherapy ([place]) 10/13/2020  ? Cerebellar ataxia in diseases classified elsewhere ([place]) 08/08/2020  ? Retroperitoneal lymphadenopathy 08/08/2020  ? Peripheral neuropathic pain 07/22/2020  ? GERD (gastroesophageal reflux disease) 07/03/2020  ? Stage 3b chronic kidney disease ([place]) 07/03/2020  ? Bilateral ureteral obstruction 05/27/2020  ? Hydronephrosis 05/18/2019  ? 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[place]) 05/03/2019  ? 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 Closed compression fracture of body of L1  vertebra ([place]) 02/24/2019  ? Age-related osteoporosis with current pathological fracture with routine healing 02/24/2019  ? Moderate to severe pulmonary hypertension ([place]) 02/21/2019  ? Pelvic mass 02/20/2019  ? Hiatal hernia 02/20/2019  ? Fibroid  uterus 02/20/2019  ? Bladder wall thickening 02/20/2019  ? Aortic atherosclerosis ([place]) 02/20/2019  ? Obesity (BMI 30-39.9) 10/05/2014  ? ARM PAIN, LEFT 10/13/2010  ? INSOMNIA 05/13/2009  ? DIZZINESS 09/13/2007  ? Hyperlipidemia 08/19/2007  ? Depression, recurrent ([place]) 09/28/2006  ? HYPERTENSION, BENIGN SYSTEMIC 09/28/2006  ? MENOPAUSAL SYNDROME 09/28/2006  ? VASOVAGAL SYNCOPE 09/28/2006  ? FATIGUE/MALAISE 09/28/2006  ? ? ?Conditions to be addressed/monitored:HTN, HLD, and Peripheral Neuropathy, Fall Prevention ? ?Care Plan : RN Care Manager Plan of Care  ?Updates made by Luretha Rued, RN since 05/06/2022 12:00 AM  ?  ? ?Problem: Chronic Disease Management education and/or Care Coordination needs   ?Priority: High  ?  ? ?Long-Range Goal: Development of Plan of Care for Chronic Disease Management and/or Care Coordination Needs   ?Start Date: 01/28/2022  ?Expected End Date: 07/28/2022  ?Priority: High  ?Note:   ?Current Barriers:     ?05/06/22 Amy Wilson reports improvement overall. She is "8 months post re-exploration thoracic spine laminectomy with drainage of epidural hematoma for meningioma". Last office visit with neurosurgeon was 04/28/22. She reports he is very pleased with her progress. She continues to engage with home health physical therapy. She reports "I still cannot walk", but is working on ambulating with walker. She reports decreased sensation to feet is making it more difficult. Denies any falls. She reports no signs/symptoms of UTI and is scheduled for stent change on tomorrow (05/07/22). She reports she is feeling good today. She denies any questions or concerns at this time.   ?Knowledge Deficits related to plan of care for management of HTN, HLD, and fall prevention  ?Chronic Disease Management support and education needs related to HTN, HLD, and fall prevention ? ?RNCM Clinical Goal(s):  ?Patient will verbalize understanding of plan for management of HTN, HLD, and fall prevention as evidenced by self  report and/or chart notation ?demonstrate ongoing adherence to prescribed treatment plan for HTN, HLD, and fall prevention as evidenced by blood pressure controlled, taking medications as prescribed, attending provider appointment as scheduled  through collaboration with RN Care manager, provider, and care team.  ? ?Interventions: ?1:1 collaboration with primary care provider regarding development and update of comprehensive plan of care as evidenced by provider attestation and co-signature ?Inter-disciplinary care team collaboration (see longitudinal plan of care) ?Evaluation of current treatment plan related to  self management and patient's adherence to plan as established by provider ? ?Peripheral Neuropathy/Fall risk Interventions  (Status:  Goal on track:  Yes.)  Long Term Goal ?Reinforced the importance of notifying provider of any falls ?Medications reviewed and encouraged to take as prescribed ?Reassessed for falls since last encounter ? ?Hyperlipidemia Interventions:  (Status:  Goal on track:  Yes.) Long Term Goal ?Per chart: last Lipid level done on 04/22/21: Cholesterol 174; HDL 39; Triglyceride 157; LDL 108 ?Medication review performed; medication list updated in electronic medical record ?Encouraged to continue healthy eating, minimize saturated fats and transfats ?Reviewed lipid level with patient and encouraged to continue to eat healthy ?Encouraged to attend provider visits as scheduled ? ?Hypertension Interventions:  (Status:  Goal on track:  Yes.) Long Term Goal ?Per Patient: Home BP reading have been within normal range-checked routinely during HHPT visits ?Last practice recorded BP readings:  ?BP Readings from Last  3 Encounters:  ?02/19/22 111/65  ?10/01/21 121/66  ?08/04/21 110/63  ?Most recent eGFR/CrCl:  ?Lab Results  ?Component Value Date  ? EGFR 49 (L) 02/19/2022  ?  No components found for: CRCL ?Discussed blood pressure readings ?Encouraged to continue to attend provider visits as  scheduled ?Encouraged patient to continue to monitor salt intake and eat healthy  ? ?Patient Goals/Self-Care Activities: ?Continue to attend provider visits as recommended/scheduled ?Continue to participate in phys

## 2022-05-07 DIAGNOSIS — Z466 Encounter for fitting and adjustment of urinary device: Secondary | ICD-10-CM | POA: Diagnosis not present

## 2022-05-07 DIAGNOSIS — N131 Hydronephrosis with ureteral stricture, not elsewhere classified: Secondary | ICD-10-CM | POA: Diagnosis not present

## 2022-05-07 DIAGNOSIS — C7982 Secondary malignant neoplasm of genital organs: Secondary | ICD-10-CM | POA: Diagnosis not present

## 2022-05-07 DIAGNOSIS — K219 Gastro-esophageal reflux disease without esophagitis: Secondary | ICD-10-CM | POA: Diagnosis not present

## 2022-05-07 DIAGNOSIS — Z9889 Other specified postprocedural states: Secondary | ICD-10-CM | POA: Diagnosis not present

## 2022-05-07 DIAGNOSIS — N1832 Chronic kidney disease, stage 3b: Secondary | ICD-10-CM | POA: Diagnosis not present

## 2022-05-07 DIAGNOSIS — N289 Disorder of kidney and ureter, unspecified: Secondary | ICD-10-CM | POA: Diagnosis not present

## 2022-05-07 DIAGNOSIS — I129 Hypertensive chronic kidney disease with stage 1 through stage 4 chronic kidney disease, or unspecified chronic kidney disease: Secondary | ICD-10-CM | POA: Diagnosis not present

## 2022-05-07 DIAGNOSIS — C799 Secondary malignant neoplasm of unspecified site: Secondary | ICD-10-CM | POA: Diagnosis not present

## 2022-05-07 DIAGNOSIS — C541 Malignant neoplasm of endometrium: Secondary | ICD-10-CM | POA: Diagnosis not present

## 2022-05-12 DIAGNOSIS — C541 Malignant neoplasm of endometrium: Secondary | ICD-10-CM | POA: Diagnosis not present

## 2022-05-19 ENCOUNTER — Telehealth: Payer: Self-pay | Admitting: *Deleted

## 2022-05-19 NOTE — Chronic Care Management (AMB) (Unsigned)
  Chronic Care Management Note  05/19/2022 Name: [name] MRN: [id number] DOB: 02/27/1951  Amy Wilson is a 71 y.o. year old female who is a primary care patient of Hali Marry, MD and is actively engaged with the care management team. I reached out to Amy Wilson by phone today to assist with re-scheduling a follow up visit with the RN Case Manager  Follow up plan: Unsuccessful telephone outreach attempt made. A HIPAA compliant phone message was left for the patient providing contact information and requesting a return call.   Julian Hy, [place] Management  Direct Dial: [phone]

## 2022-05-20 DIAGNOSIS — E785 Hyperlipidemia, unspecified: Secondary | ICD-10-CM | POA: Diagnosis not present

## 2022-05-20 DIAGNOSIS — I10 Essential (primary) hypertension: Secondary | ICD-10-CM | POA: Diagnosis not present

## 2022-05-20 NOTE — Chronic Care Management (AMB) (Signed)
  Chronic Care Management Note  05/20/2022 Name: Amy Wilson MRN: [id number] DOB: 1951-07-26  Amy Wilson is a 71 y.o. year old female who is a primary care patient of Hali Marry, MD and is actively engaged with the care management team. I reached out to Amy Wilson by phone today to assist with re-scheduling a follow up visit with the RN Case Manager  Follow up plan: Telephone appointment with care management team member scheduled for: 06/24/2022  Julian Hy, [place], [place] Management  Direct Dial: [phone]

## 2022-05-28 DIAGNOSIS — H52209 Unspecified astigmatism, unspecified eye: Secondary | ICD-10-CM | POA: Diagnosis not present

## 2022-05-28 DIAGNOSIS — H524 Presbyopia: Secondary | ICD-10-CM | POA: Diagnosis not present

## 2022-05-28 DIAGNOSIS — H5203 Hypermetropia, bilateral: Secondary | ICD-10-CM | POA: Diagnosis not present

## 2022-05-28 DIAGNOSIS — H2513 Age-related nuclear cataract, bilateral: Secondary | ICD-10-CM | POA: Diagnosis not present

## 2022-06-10 ENCOUNTER — Telehealth: Payer: Medicare HMO

## 2022-06-17 ENCOUNTER — Ambulatory Visit (INDEPENDENT_AMBULATORY_CARE_PROVIDER_SITE_OTHER): Payer: Medicare HMO

## 2022-06-17 DIAGNOSIS — I10 Essential (primary) hypertension: Secondary | ICD-10-CM

## 2022-06-17 DIAGNOSIS — E785 Hyperlipidemia, unspecified: Secondary | ICD-10-CM

## 2022-06-17 NOTE — Chronic Care Management (AMB) (Signed)
Chronic Care Management   CCM RN Visit Note  06/17/2022 Name: Amy Wilson MRN: [id number] DOB: 27-Feb-1951  Subjective: Amy Wilson is a 71 y.o. year old female who is a primary care patient of Metheney, Rene Kocher, MD. The care management team was consulted for assistance with disease management and care coordination needs.    Engaged with patient by telephone for follow up visit in response to provider referral for case management and/or care coordination services.   Consent to Services:  The patient was given information about Chronic Care Management services, agreed to services, and gave verbal consent prior to initiation of services.  Please see initial visit note for detailed documentation.   Patient agreed to services and verbal consent obtained.   Assessment: Review of patient past medical history, allergies, medications, health status, including review of consultants reports, laboratory and other test data, was performed as part of comprehensive evaluation and provision of chronic care management services.   SDOH (Social Determinants of Health) assessments and interventions performed:    CCM Care Plan  Allergies  Allergen Reactions   Latex Itching and Rash    Outpatient Encounter Medications as of 06/17/2022  Medication Sig Note   Cholecalciferol 25 MCG (1000 UT) capsule Take 1 capsule by mouth daily. 05/06/2022: Reports taking 2000 IU daily   cyclobenzaprine (FLEXERIL) 10 MG tablet Take 10 mg by mouth 3 (three) times daily as needed. (Patient not taking: Reported on 05/06/2022)    esomeprazole (NEXIUM) 20 MG capsule Take 1 capsule (20 mg total) by mouth daily before breakfast. Take 30 minutes before breakfast 04/01/2022: Reports takes as needed   HYDROcodone-acetaminophen (NORCO/VICODIN) 5-325 MG tablet Take 1 tablet by mouth every 6 (six) hours as needed.    magnesium oxide (MAG-OX) 400 (240 Mg) MG tablet Take 1 tablet by mouth daily. (Patient not taking: Reported on  04/01/2022)    Multiple Vitamin (MULTIVITAMIN) tablet Take 1 tablet by mouth daily.    NITROFURANTOIN PO Take 50 mg by mouth daily.    ondansetron (ZOFRAN) 8 MG tablet Take by mouth. 06/11/2021: Reports takes prn   oxybutynin (DITROPAN) 5 MG tablet Take 1 tablet by mouth twice daily    pregabalin (LYRICA) 150 MG capsule Take 1 capsule (150 mg total) by mouth 2 (two) times daily. (Patient not taking: Reported on 04/01/2022)    terbinafine (LAMISIL) 250 MG tablet Take 1 tablet (250 mg total) by mouth daily. X 12 weeks (Patient not taking: Reported on 04/01/2022)    No facility-administered encounter medications on file as of 06/17/2022.    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   Left leg weakness 06/02/2021   Recurrent UTI 11/27/2020   Neutropenia associated with infection ([place]) 11/23/2020   Mixed stress and urge urinary incontinence 10/22/2020   Generalized weakness 10/22/2020   Pancytopenia due to antineoplastic chemotherapy ([place]) 10/13/2020   Cerebellar ataxia in diseases classified elsewhere ([place]) 08/08/2020   Retroperitoneal lymphadenopathy 08/08/2020   Peripheral neuropathic pain 07/22/2020   GERD (gastroesophageal reflux disease) 07/03/2020   Stage 3b chronic kidney disease ([place]) 07/03/2020   Bilateral ureteral obstruction 05/27/2020   Hydronephrosis 05/18/2019   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[place]) 05/03/2019   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[place]) 05/03/2019   Endometrial adenocarcinoma ([place]) 03/03/2019   Closed compression fracture of body of L1  vertebra ([place]) 02/24/2019   Age-related osteoporosis with current pathological fracture with routine healing 02/24/2019   Moderate to severe pulmonary hypertension ([place]) 02/21/2019   Pelvic mass 02/20/2019   Hiatal hernia 02/20/2019   Fibroid  uterus 02/20/2019   Bladder wall thickening 02/20/2019   Aortic atherosclerosis ([place]) 02/20/2019   Obesity (BMI 30-39.9) 10/05/2014   ARM PAIN, LEFT 10/13/2010   INSOMNIA 05/13/2009   DIZZINESS 09/13/2007   Hyperlipidemia 08/19/2007   Depression, recurrent ([place]) 09/28/2006   HYPERTENSION, BENIGN SYSTEMIC 09/28/2006   MENOPAUSAL SYNDROME 09/28/2006   VASOVAGAL SYNCOPE 09/28/2006   FATIGUE/MALAISE 09/28/2006    Conditions to be addressed/monitored:HTN, HLD, and Fall risk  Care Plan : [place] of Care  Updates made by Luretha Rued, RN since 06/17/2022 12:00 AM  Completed 06/17/2022   Problem: Chronic Disease Management education and/or Care Coordination needs Resolved 06/17/2022  Priority: High     Long-Range Goal: Development of Plan of Care for Chronic Disease Management and/or Care Coordination Needs Completed 06/17/2022  Start Date: 01/28/2022  Expected End Date: 07/28/2022  Priority: High  Note:   Current Barriers:  06/17/22 Ms. Bobo reports she is doing well. She continues to perform exercised provided by home health agency. She states she is still not walking, but is interested in outpatient physical therapy. She states she is calling to request a referral for outpatient therapy facility that has a pool. She states her blood pressure "has been great". Per patient, Last checked blood pressure 106/80. She denies any issues or concerns. Discussed case closure. Patient in agreement. Knowledge Deficits related to plan of care for management of HTN, HLD, and fall prevention  Chronic Disease Management support and education needs related to HTN, HLD, and fall prevention  RNCM Clinical Goal(s):  Patient will verbalize understanding of plan for management of HTN, HLD, and fall prevention as evidenced by self report and/or chart notation demonstrate ongoing adherence to prescribed treatment plan for HTN, HLD, and fall prevention as evidenced by blood pressure controlled, taking  medications as prescribed, attending provider appointment as scheduled  through collaboration with RN Care manager, provider, and care team.   Interventions: 1:1 collaboration with primary care provider regarding development and update of comprehensive plan of care as evidenced by provider attestation and co-signature Inter-disciplinary care team collaboration (see longitudinal plan of care) Evaluation of current treatment plan related to  self management and patient's adherence to plan as established by provider  Peripheral Neuropathy/Fall risk Interventions  (Status:  Goal Met.)  Long Term Goal Continues fall prevention strategies. She states she continues her exercises and is in process of seeking a referral for outpatient therapy. Reinforced the importance of notifying provider of any falls Medications reviewed and encouraged to take as prescribed Reassessed for falls since last encounter  Hyperlipidemia Interventions:  (Status:  Goal Met.) Long Term Goal Voiced strategies for improving hyperlipidemia Medication review performed; medication list updated in electronic medical record Encouraged to continue healthy eating, minimize saturated fats and transfats Reviewed lipid level with patient and encouraged to continue to eat healthy Encouraged to attend provider visits as scheduled  Hypertension Interventions:  (Status:  Goal Met.) Long Term Goal Per Patient: "BP's have been really good 106/80 last check" Per Chart [facility]: 05/12/22 109/76 Per chart [facility]: 04/28/22 BP 126/82 Last practice recorded BP readings:  BP Readings from Last 3 Encounters:  02/19/22 111/65  10/01/21 121/66  08/04/21 110/63  Most recent eGFR/CrCl:  Lab Results  Component Value Date  EGFR 49 (L) 02/19/2022    No components found for: CRCL Discussed blood pressure readings Encouraged to continue to attend provider visits as scheduled Encouraged patient to continue to monitor salt intake and eat  healthy   Patient Goals/Self-Care Activities: Continue to attend provider visits as recommended/scheduled Continue to participate in physical therapy sessions and perform exercises as recommended eat more whole grains, fruits and vegetables, lean meats and healthy fats Continue keep track of your blood pressure. Notify provider if it is out of your normal range Continue to maintain good skin care Continue to work with care management clinical team to address health care and disease management related needs   Plan:No further follow up required: Patient encouraged to contact Primary care provider for referral if RNCM needs in the future.  [name], RN, MSN, BSN, CCM Care Management Coordinator [facility] [phone]

## 2022-06-17 NOTE — Patient Instructions (Signed)
Visit Information ° °Thank you for allowing me to share the care management and care coordination services that are available to you as part of your health plan and services through your primary care provider and medical home. Please reach out to me at [phone] if the care management/care coordination team may be of assistance to you in the future.  ° °Christan Ciccarelli, RN, MSN, BSN, CCM °Care Management Coordinator °[facility] °[phone]  °

## 2022-06-17 NOTE — Addendum Note (Signed)
Addended by: [name] D on: 06/17/2022 05:26 PM   Modules accepted: Orders

## 2022-06-19 DIAGNOSIS — E785 Hyperlipidemia, unspecified: Secondary | ICD-10-CM | POA: Diagnosis not present

## 2022-06-19 DIAGNOSIS — I10 Essential (primary) hypertension: Secondary | ICD-10-CM | POA: Diagnosis not present

## 2022-06-24 ENCOUNTER — Telehealth: Payer: Medicare HMO

## 2022-07-06 DIAGNOSIS — M545 Low back pain, unspecified: Secondary | ICD-10-CM | POA: Diagnosis not present

## 2022-07-06 DIAGNOSIS — M6281 Muscle weakness (generalized): Secondary | ICD-10-CM | POA: Diagnosis not present

## 2022-07-07 DIAGNOSIS — C7931 Secondary malignant neoplasm of brain: Secondary | ICD-10-CM | POA: Diagnosis not present

## 2022-07-07 DIAGNOSIS — G8191 Hemiplegia, unspecified affecting right dominant side: Secondary | ICD-10-CM | POA: Diagnosis not present

## 2022-07-07 DIAGNOSIS — N3289 Other specified disorders of bladder: Secondary | ICD-10-CM | POA: Diagnosis not present

## 2022-07-07 DIAGNOSIS — R112 Nausea with vomiting, unspecified: Secondary | ICD-10-CM | POA: Diagnosis not present

## 2022-07-07 DIAGNOSIS — K838 Other specified diseases of biliary tract: Secondary | ICD-10-CM | POA: Diagnosis not present

## 2022-07-07 DIAGNOSIS — I272 Pulmonary hypertension, unspecified: Secondary | ICD-10-CM | POA: Diagnosis not present

## 2022-07-07 DIAGNOSIS — C778 Secondary and unspecified malignant neoplasm of lymph nodes of multiple regions: Secondary | ICD-10-CM | POA: Diagnosis not present

## 2022-07-07 DIAGNOSIS — R590 Localized enlarged lymph nodes: Secondary | ICD-10-CM | POA: Diagnosis not present

## 2022-07-07 DIAGNOSIS — M4802 Spinal stenosis, cervical region: Secondary | ICD-10-CM | POA: Diagnosis not present

## 2022-07-07 DIAGNOSIS — I129 Hypertensive chronic kidney disease with stage 1 through stage 4 chronic kidney disease, or unspecified chronic kidney disease: Secondary | ICD-10-CM | POA: Diagnosis not present

## 2022-07-07 DIAGNOSIS — K449 Diaphragmatic hernia without obstruction or gangrene: Secondary | ICD-10-CM | POA: Diagnosis not present

## 2022-07-07 DIAGNOSIS — I10 Essential (primary) hypertension: Secondary | ICD-10-CM | POA: Diagnosis not present

## 2022-07-07 DIAGNOSIS — A4151 Sepsis due to Escherichia coli [E. coli]: Secondary | ICD-10-CM | POA: Diagnosis not present

## 2022-07-07 DIAGNOSIS — R531 Weakness: Secondary | ICD-10-CM | POA: Insufficient documentation

## 2022-07-07 DIAGNOSIS — C541 Malignant neoplasm of endometrium: Secondary | ICD-10-CM | POA: Diagnosis not present

## 2022-07-07 DIAGNOSIS — F419 Anxiety disorder, unspecified: Secondary | ICD-10-CM | POA: Diagnosis not present

## 2022-07-07 DIAGNOSIS — E041 Nontoxic single thyroid nodule: Secondary | ICD-10-CM | POA: Diagnosis not present

## 2022-07-07 DIAGNOSIS — R630 Anorexia: Secondary | ICD-10-CM | POA: Diagnosis not present

## 2022-07-07 DIAGNOSIS — C770 Secondary and unspecified malignant neoplasm of lymph nodes of head, face and neck: Secondary | ICD-10-CM | POA: Diagnosis not present

## 2022-07-07 DIAGNOSIS — K2960 Other gastritis without bleeding: Secondary | ICD-10-CM | POA: Diagnosis not present

## 2022-07-07 DIAGNOSIS — G935 Compression of brain: Secondary | ICD-10-CM | POA: Diagnosis not present

## 2022-07-07 DIAGNOSIS — N1330 Unspecified hydronephrosis: Secondary | ICD-10-CM | POA: Diagnosis not present

## 2022-07-07 DIAGNOSIS — M5030 Other cervical disc degeneration, unspecified cervical region: Secondary | ICD-10-CM | POA: Diagnosis not present

## 2022-07-07 DIAGNOSIS — I6781 Acute cerebrovascular insufficiency: Secondary | ICD-10-CM | POA: Diagnosis not present

## 2022-07-07 DIAGNOSIS — N1831 Chronic kidney disease, stage 3a: Secondary | ICD-10-CM | POA: Diagnosis not present

## 2022-07-07 DIAGNOSIS — G936 Cerebral edema: Secondary | ICD-10-CM | POA: Diagnosis not present

## 2022-07-07 DIAGNOSIS — G9389 Other specified disorders of brain: Secondary | ICD-10-CM | POA: Diagnosis not present

## 2022-07-07 DIAGNOSIS — I2510 Atherosclerotic heart disease of native coronary artery without angina pectoris: Secondary | ICD-10-CM | POA: Diagnosis not present

## 2022-07-07 DIAGNOSIS — M2578 Osteophyte, vertebrae: Secondary | ICD-10-CM | POA: Diagnosis not present

## 2022-07-07 DIAGNOSIS — C7989 Secondary malignant neoplasm of other specified sites: Secondary | ICD-10-CM | POA: Diagnosis not present

## 2022-07-07 DIAGNOSIS — D496 Neoplasm of unspecified behavior of brain: Secondary | ICD-10-CM | POA: Diagnosis not present

## 2022-07-07 DIAGNOSIS — D62 Acute posthemorrhagic anemia: Secondary | ICD-10-CM | POA: Diagnosis not present

## 2022-07-07 DIAGNOSIS — N3000 Acute cystitis without hematuria: Secondary | ICD-10-CM | POA: Diagnosis not present

## 2022-07-07 DIAGNOSIS — M47812 Spondylosis without myelopathy or radiculopathy, cervical region: Secondary | ICD-10-CM | POA: Diagnosis not present

## 2022-07-07 DIAGNOSIS — A0472 Enterocolitis due to Clostridium difficile, not specified as recurrent: Secondary | ICD-10-CM | POA: Diagnosis not present

## 2022-07-07 DIAGNOSIS — R110 Nausea: Secondary | ICD-10-CM | POA: Diagnosis not present

## 2022-07-07 DIAGNOSIS — J189 Pneumonia, unspecified organism: Secondary | ICD-10-CM | POA: Diagnosis not present

## 2022-07-07 DIAGNOSIS — R900 Intracranial space-occupying lesion found on diagnostic imaging of central nervous system: Secondary | ICD-10-CM | POA: Diagnosis not present

## 2022-07-07 DIAGNOSIS — Z90710 Acquired absence of both cervix and uterus: Secondary | ICD-10-CM | POA: Diagnosis not present

## 2022-07-08 DIAGNOSIS — N3000 Acute cystitis without hematuria: Secondary | ICD-10-CM | POA: Diagnosis not present

## 2022-07-08 DIAGNOSIS — C778 Secondary and unspecified malignant neoplasm of lymph nodes of multiple regions: Secondary | ICD-10-CM | POA: Diagnosis not present

## 2022-07-08 DIAGNOSIS — N1831 Chronic kidney disease, stage 3a: Secondary | ICD-10-CM | POA: Diagnosis not present

## 2022-07-08 DIAGNOSIS — G936 Cerebral edema: Secondary | ICD-10-CM | POA: Diagnosis not present

## 2022-07-08 DIAGNOSIS — C770 Secondary and unspecified malignant neoplasm of lymph nodes of head, face and neck: Secondary | ICD-10-CM | POA: Diagnosis not present

## 2022-07-08 DIAGNOSIS — A0472 Enterocolitis due to Clostridium difficile, not specified as recurrent: Secondary | ICD-10-CM | POA: Diagnosis not present

## 2022-07-08 DIAGNOSIS — I10 Essential (primary) hypertension: Secondary | ICD-10-CM | POA: Diagnosis not present

## 2022-07-08 DIAGNOSIS — C541 Malignant neoplasm of endometrium: Secondary | ICD-10-CM | POA: Diagnosis not present

## 2022-07-08 DIAGNOSIS — N1330 Unspecified hydronephrosis: Secondary | ICD-10-CM | POA: Diagnosis not present

## 2022-07-08 DIAGNOSIS — E041 Nontoxic single thyroid nodule: Secondary | ICD-10-CM | POA: Diagnosis not present

## 2022-07-08 DIAGNOSIS — A4151 Sepsis due to Escherichia coli [E. coli]: Secondary | ICD-10-CM | POA: Diagnosis not present

## 2022-07-08 DIAGNOSIS — G8191 Hemiplegia, unspecified affecting right dominant side: Secondary | ICD-10-CM | POA: Diagnosis not present

## 2022-07-08 DIAGNOSIS — C7931 Secondary malignant neoplasm of brain: Secondary | ICD-10-CM | POA: Diagnosis not present

## 2022-07-08 DIAGNOSIS — I272 Pulmonary hypertension, unspecified: Secondary | ICD-10-CM | POA: Diagnosis not present

## 2022-07-09 DIAGNOSIS — C7931 Secondary malignant neoplasm of brain: Secondary | ICD-10-CM | POA: Insufficient documentation

## 2022-07-09 DIAGNOSIS — C541 Malignant neoplasm of endometrium: Secondary | ICD-10-CM | POA: Diagnosis not present

## 2022-07-09 DIAGNOSIS — D496 Neoplasm of unspecified behavior of brain: Secondary | ICD-10-CM | POA: Diagnosis not present

## 2022-07-09 DIAGNOSIS — G935 Compression of brain: Secondary | ICD-10-CM | POA: Diagnosis not present

## 2022-07-10 DIAGNOSIS — C770 Secondary and unspecified malignant neoplasm of lymph nodes of head, face and neck: Secondary | ICD-10-CM | POA: Diagnosis not present

## 2022-07-10 DIAGNOSIS — D496 Neoplasm of unspecified behavior of brain: Secondary | ICD-10-CM | POA: Diagnosis not present

## 2022-07-10 DIAGNOSIS — G935 Compression of brain: Secondary | ICD-10-CM | POA: Diagnosis not present

## 2022-07-10 DIAGNOSIS — C7931 Secondary malignant neoplasm of brain: Secondary | ICD-10-CM | POA: Diagnosis not present

## 2022-07-11 DIAGNOSIS — A4151 Sepsis due to Escherichia coli [E. coli]: Secondary | ICD-10-CM | POA: Diagnosis not present

## 2022-07-11 DIAGNOSIS — G936 Cerebral edema: Secondary | ICD-10-CM | POA: Diagnosis not present

## 2022-07-11 DIAGNOSIS — C770 Secondary and unspecified malignant neoplasm of lymph nodes of head, face and neck: Secondary | ICD-10-CM | POA: Diagnosis not present

## 2022-07-11 DIAGNOSIS — R590 Localized enlarged lymph nodes: Secondary | ICD-10-CM | POA: Diagnosis not present

## 2022-07-11 DIAGNOSIS — I129 Hypertensive chronic kidney disease with stage 1 through stage 4 chronic kidney disease, or unspecified chronic kidney disease: Secondary | ICD-10-CM | POA: Diagnosis not present

## 2022-07-11 DIAGNOSIS — C7931 Secondary malignant neoplasm of brain: Secondary | ICD-10-CM | POA: Diagnosis not present

## 2022-07-11 DIAGNOSIS — N1831 Chronic kidney disease, stage 3a: Secondary | ICD-10-CM | POA: Diagnosis not present

## 2022-07-11 DIAGNOSIS — D62 Acute posthemorrhagic anemia: Secondary | ICD-10-CM | POA: Diagnosis not present

## 2022-07-12 DIAGNOSIS — G9389 Other specified disorders of brain: Secondary | ICD-10-CM | POA: Diagnosis not present

## 2022-07-12 DIAGNOSIS — G936 Cerebral edema: Secondary | ICD-10-CM | POA: Diagnosis not present

## 2022-07-12 DIAGNOSIS — C7931 Secondary malignant neoplasm of brain: Secondary | ICD-10-CM | POA: Diagnosis not present

## 2022-07-13 DIAGNOSIS — C7931 Secondary malignant neoplasm of brain: Secondary | ICD-10-CM | POA: Diagnosis not present

## 2022-07-14 DIAGNOSIS — C7931 Secondary malignant neoplasm of brain: Secondary | ICD-10-CM | POA: Diagnosis not present

## 2022-07-14 DIAGNOSIS — J189 Pneumonia, unspecified organism: Secondary | ICD-10-CM | POA: Diagnosis not present

## 2022-07-15 DIAGNOSIS — C7931 Secondary malignant neoplasm of brain: Secondary | ICD-10-CM | POA: Diagnosis not present

## 2022-07-16 DIAGNOSIS — C7931 Secondary malignant neoplasm of brain: Secondary | ICD-10-CM | POA: Diagnosis not present

## 2022-07-16 DIAGNOSIS — C541 Malignant neoplasm of endometrium: Secondary | ICD-10-CM | POA: Diagnosis not present

## 2022-07-17 DIAGNOSIS — C7931 Secondary malignant neoplasm of brain: Secondary | ICD-10-CM | POA: Diagnosis not present

## 2022-07-18 DIAGNOSIS — C7931 Secondary malignant neoplasm of brain: Secondary | ICD-10-CM | POA: Diagnosis not present

## 2022-07-19 DIAGNOSIS — A0472 Enterocolitis due to Clostridium difficile, not specified as recurrent: Secondary | ICD-10-CM | POA: Diagnosis not present

## 2022-07-19 DIAGNOSIS — K2960 Other gastritis without bleeding: Secondary | ICD-10-CM | POA: Diagnosis not present

## 2022-07-19 DIAGNOSIS — R110 Nausea: Secondary | ICD-10-CM | POA: Diagnosis not present

## 2022-07-19 DIAGNOSIS — R112 Nausea with vomiting, unspecified: Secondary | ICD-10-CM | POA: Diagnosis not present

## 2022-07-19 DIAGNOSIS — N1831 Chronic kidney disease, stage 3a: Secondary | ICD-10-CM | POA: Diagnosis not present

## 2022-07-19 DIAGNOSIS — C7931 Secondary malignant neoplasm of brain: Secondary | ICD-10-CM | POA: Diagnosis not present

## 2022-07-19 DIAGNOSIS — C541 Malignant neoplasm of endometrium: Secondary | ICD-10-CM | POA: Diagnosis not present

## 2022-07-19 DIAGNOSIS — R630 Anorexia: Secondary | ICD-10-CM | POA: Diagnosis not present

## 2022-07-20 DIAGNOSIS — R630 Anorexia: Secondary | ICD-10-CM | POA: Diagnosis not present

## 2022-07-20 DIAGNOSIS — R112 Nausea with vomiting, unspecified: Secondary | ICD-10-CM | POA: Diagnosis not present

## 2022-07-20 DIAGNOSIS — K2960 Other gastritis without bleeding: Secondary | ICD-10-CM | POA: Diagnosis not present

## 2022-07-20 DIAGNOSIS — A0472 Enterocolitis due to Clostridium difficile, not specified as recurrent: Secondary | ICD-10-CM | POA: Diagnosis not present

## 2022-07-20 DIAGNOSIS — R110 Nausea: Secondary | ICD-10-CM | POA: Diagnosis not present

## 2022-07-20 DIAGNOSIS — C541 Malignant neoplasm of endometrium: Secondary | ICD-10-CM | POA: Diagnosis not present

## 2022-07-20 DIAGNOSIS — C7931 Secondary malignant neoplasm of brain: Secondary | ICD-10-CM | POA: Diagnosis not present

## 2022-07-21 DIAGNOSIS — R112 Nausea with vomiting, unspecified: Secondary | ICD-10-CM | POA: Diagnosis not present

## 2022-07-21 DIAGNOSIS — K2960 Other gastritis without bleeding: Secondary | ICD-10-CM | POA: Diagnosis not present

## 2022-07-21 DIAGNOSIS — R630 Anorexia: Secondary | ICD-10-CM | POA: Diagnosis not present

## 2022-07-21 DIAGNOSIS — C541 Malignant neoplasm of endometrium: Secondary | ICD-10-CM | POA: Diagnosis not present

## 2022-07-21 DIAGNOSIS — C7931 Secondary malignant neoplasm of brain: Secondary | ICD-10-CM | POA: Diagnosis not present

## 2022-07-22 DIAGNOSIS — K2960 Other gastritis without bleeding: Secondary | ICD-10-CM | POA: Diagnosis not present

## 2022-07-22 DIAGNOSIS — C7931 Secondary malignant neoplasm of brain: Secondary | ICD-10-CM | POA: Diagnosis not present

## 2022-07-22 DIAGNOSIS — R112 Nausea with vomiting, unspecified: Secondary | ICD-10-CM | POA: Diagnosis not present

## 2022-07-22 DIAGNOSIS — R630 Anorexia: Secondary | ICD-10-CM | POA: Diagnosis not present

## 2022-07-22 DIAGNOSIS — C541 Malignant neoplasm of endometrium: Secondary | ICD-10-CM | POA: Diagnosis not present

## 2022-07-23 DIAGNOSIS — C7931 Secondary malignant neoplasm of brain: Secondary | ICD-10-CM | POA: Diagnosis not present

## 2022-07-24 ENCOUNTER — Encounter: Payer: Self-pay | Admitting: *Deleted

## 2022-07-24 ENCOUNTER — Telehealth: Payer: Self-pay | Admitting: *Deleted

## 2022-07-24 DIAGNOSIS — Z510 Encounter for antineoplastic radiation therapy: Secondary | ICD-10-CM | POA: Diagnosis not present

## 2022-07-24 DIAGNOSIS — C7931 Secondary malignant neoplasm of brain: Secondary | ICD-10-CM | POA: Diagnosis not present

## 2022-07-24 DIAGNOSIS — C541 Malignant neoplasm of endometrium: Secondary | ICD-10-CM | POA: Diagnosis not present

## 2022-07-24 NOTE — Patient Outreach (Signed)
  Care Coordination [facility] Note Transition Care Management Follow-up Telephone Call Date of discharge and from where: 07/23/22 [facility] How have you been since you were released from the hospital? "I am doing much better: I have everything I need and am being well taken care of" Any questions or concerns? No  Items Reviewed: Did the pt receive and understand the discharge instructions provided? Yes  Medications obtained and verified? Yes  Other?  N/A Any new allergies since your discharge? No  Dietary orders reviewed? Yes Do you have support at home? Yes   Home Care and Equipment/Supplies: Were home health services ordered? Yes- just arrive at patient's home If so, what is the name of the agency? [place]  Has the agency set up a time to come to the patient's home? yes Were any new equipment or medical supplies ordered?  No What is the name of the medical supply agency? N/A Were you able to get the supplies/equipment? not applicable Do you have any questions related to the use of the equipment or supplies? No N/A  Functional Questionnaire: (I = Independent and D = Dependent) ADLs: I daughter and son-in-law assisting as needed  Bathing/Dressing- D   Meal Prep- D  Eating- I  Maintaining continence- I daughter and son-in-law assisting as needed  Transferring/Ambulation- D  Managing Meds- D  Follow up appointments reviewed:  PCP Hospital f/u appt confirmed? No  Scheduled to see - on - @ - waiting to schedule until after newly ordered radiation starts [facility] f/u appt confirmed? Yes  Scheduled to see Dr. Freddrick March, oncologist; reports oncology to call to confirm  Are transportation arrangements needed? No  If their condition worsens, is the pt aware to call PCP or go to the Emergency Dept.? Yes Was the patient provided with contact information for the PCP's office or ED? Yes Was to pt encouraged to call back with questions or concerns? Yes  SDOH  assessments and interventions completed:   Yes  Care Coordination Interventions Activated:  No   Care Coordination Interventions:   N/A     Encounter Outcome:  Pt. Visit Completed    Oneta Rack, RN, BSN, [place] RN [place] Management [phone]: direct office

## 2022-07-28 DIAGNOSIS — C7931 Secondary malignant neoplasm of brain: Secondary | ICD-10-CM | POA: Diagnosis not present

## 2022-07-28 DIAGNOSIS — C541 Malignant neoplasm of endometrium: Secondary | ICD-10-CM | POA: Diagnosis not present

## 2022-07-29 DIAGNOSIS — C7931 Secondary malignant neoplasm of brain: Secondary | ICD-10-CM | POA: Diagnosis not present

## 2022-07-29 DIAGNOSIS — C541 Malignant neoplasm of endometrium: Secondary | ICD-10-CM | POA: Diagnosis not present

## 2022-07-29 DIAGNOSIS — Z510 Encounter for antineoplastic radiation therapy: Secondary | ICD-10-CM | POA: Diagnosis not present

## 2022-07-31 DIAGNOSIS — C7931 Secondary malignant neoplasm of brain: Secondary | ICD-10-CM | POA: Diagnosis not present

## 2022-07-31 DIAGNOSIS — Z510 Encounter for antineoplastic radiation therapy: Secondary | ICD-10-CM | POA: Diagnosis not present

## 2022-07-31 DIAGNOSIS — C541 Malignant neoplasm of endometrium: Secondary | ICD-10-CM | POA: Diagnosis not present

## 2022-08-03 DIAGNOSIS — Z510 Encounter for antineoplastic radiation therapy: Secondary | ICD-10-CM | POA: Diagnosis not present

## 2022-08-03 DIAGNOSIS — C541 Malignant neoplasm of endometrium: Secondary | ICD-10-CM | POA: Diagnosis not present

## 2022-08-03 DIAGNOSIS — C7931 Secondary malignant neoplasm of brain: Secondary | ICD-10-CM | POA: Diagnosis not present

## 2022-08-06 ENCOUNTER — Telehealth: Payer: Self-pay | Admitting: Neurology

## 2022-08-06 NOTE — Telephone Encounter (Signed)
Apolonio Schneiders with physical therapy [facility] called and LVM stating patient was not seen for PT last week or this week due to belligerence of son-in-law. They will try to send someone out one more time. FYI.

## 2022-08-12 DIAGNOSIS — A0472 Enterocolitis due to Clostridium difficile, not specified as recurrent: Secondary | ICD-10-CM | POA: Diagnosis not present

## 2022-08-12 DIAGNOSIS — R112 Nausea with vomiting, unspecified: Secondary | ICD-10-CM | POA: Diagnosis not present

## 2022-08-12 DIAGNOSIS — C7931 Secondary malignant neoplasm of brain: Secondary | ICD-10-CM | POA: Diagnosis not present

## 2022-08-12 DIAGNOSIS — C775 Secondary and unspecified malignant neoplasm of intrapelvic lymph nodes: Secondary | ICD-10-CM | POA: Diagnosis not present

## 2022-08-12 DIAGNOSIS — C541 Malignant neoplasm of endometrium: Secondary | ICD-10-CM | POA: Diagnosis not present

## 2022-08-12 DIAGNOSIS — G9389 Other specified disorders of brain: Secondary | ICD-10-CM | POA: Diagnosis not present

## 2022-08-19 ENCOUNTER — Telehealth: Payer: Self-pay

## 2022-08-19 NOTE — Telephone Encounter (Signed)
Leda Gauze given verbal orders to continue service.  Charyl Bigger, CMA

## 2022-08-19 NOTE — Telephone Encounter (Signed)
Okay for verbal order to continue services.

## 2022-08-19 NOTE — Telephone Encounter (Signed)
Marylin Physical Therapist from [facility] called needing continuation PT Orders  Frequency  1 times a week for 1 week 2 times a week for 2 weeks 1 time a week for 1 week  Starting week 08/24/2022  Marylin's number [phone]

## 2022-08-21 ENCOUNTER — Telehealth: Payer: Self-pay

## 2022-08-21 NOTE — Telephone Encounter (Signed)
Agree with documentation as above.   Neria Procter, MD  

## 2022-08-21 NOTE — Telephone Encounter (Signed)
Rcvd call from [facility] for OT 1x4 weeks.   VO's granted. Documents being faxed.

## 2022-09-02 DIAGNOSIS — C541 Malignant neoplasm of endometrium: Secondary | ICD-10-CM | POA: Diagnosis not present

## 2022-09-03 ENCOUNTER — Encounter: Payer: Self-pay | Admitting: Family Medicine

## 2022-09-03 ENCOUNTER — Ambulatory Visit (INDEPENDENT_AMBULATORY_CARE_PROVIDER_SITE_OTHER): Payer: Medicare HMO | Admitting: Family Medicine

## 2022-09-03 VITALS — BP 140/77 | HR 92

## 2022-09-03 DIAGNOSIS — Z23 Encounter for immunization: Secondary | ICD-10-CM | POA: Diagnosis not present

## 2022-09-03 DIAGNOSIS — F339 Major depressive disorder, recurrent, unspecified: Secondary | ICD-10-CM

## 2022-09-03 DIAGNOSIS — L659 Nonscarring hair loss, unspecified: Secondary | ICD-10-CM | POA: Diagnosis not present

## 2022-09-03 DIAGNOSIS — R21 Rash and other nonspecific skin eruption: Secondary | ICD-10-CM

## 2022-09-03 DIAGNOSIS — C7931 Secondary malignant neoplasm of brain: Secondary | ICD-10-CM | POA: Diagnosis not present

## 2022-09-03 DIAGNOSIS — M791 Myalgia, unspecified site: Secondary | ICD-10-CM | POA: Diagnosis not present

## 2022-09-03 DIAGNOSIS — N390 Urinary tract infection, site not specified: Secondary | ICD-10-CM | POA: Diagnosis not present

## 2022-09-03 MED ORDER — CLOTRIMAZOLE-BETAMETHASONE 1-0.05 % EX CREA: 1.0000 | TOPICAL_CREAM | Freq: Two times a day (BID) | CUTANEOUS | 0 refills | Status: DC

## 2022-09-03 MED ORDER — ESCITALOPRAM OXALATE 10 MG PO TABS: ORAL_TABLET | ORAL | 0 refills | Status: DC

## 2022-09-03 NOTE — Assessment & Plan Note (Signed)
He is feeling much better.  I did complete her antibiotics for the UTI.

## 2022-09-03 NOTE — Progress Notes (Signed)
Acute Office Visit  Subjective:     Patient ID: Amy Wilson, female    DOB: 05-Mar-1951, 71 y.o.   MRN: [id number]  Chief Complaint  Patient presents with   Rash    HPI Patient is in today for rash.  It started several days ago on the left side of her neck surrounding her excision where she had the lymph node biopsy no drainage from the area.  Her daughter did put hydrocortisone on it yesterday and she and her son-in-law agree that it actually looks much better this morning but it still itchy and irritated.  Oncology did remove some of the glue that was over the wound.  Says she tends to sleep with her head to that left side and it tends to get sweaty and wet.  She was admitted to the hospital on July 19 for right-sided weakness.  She was found to have metastatic brain lesions with vasogenic edema resulting in the right-sided weakness.  Her last treatment of chemotherapy for endometrial cancer was about 2 years prior. Imaging showed 2.1 cm mass in the posterior left frontal lobe with edema without mass effect. 82m mass in the paramedian right parietal lobe without edema or mass effect. Neurosurgery and gynecology oncology were consulted. Neurosurgery recommended lymph node biopsy rather than brain biopsy for diagnosis. She was started on Decadron for vasogenic edema. She underwent lymph node excisional biopsy with the general surgery on 7/21, pathology showed metastatic adenocarcinoma.  Was also treated for an E. coli UTI as well as C. difficile.  She was treated with oral vancomycin and 10 days of ceftriaxone.  She has undergone 3 radiation sessions for the brain tumors and will have a follow-up scan later this fall.she has done well with treatment thus far.    She also reports diffuse hair loss. Not in clumps,etc. it happened before when she had chemotherapy.  But right now she is just having localized radiation so they reassured her it should not be from the radiation  treatments.  ROS      Objective:    BP (!) 140/77   Pulse 92   SpO2 100%    Physical Exam Vitals reviewed.  Constitutional:      Appearance: She is well-developed.  HENT:     Head: Normocephalic and atraumatic.  Eyes:     Conjunctiva/sclera: Conjunctivae normal.  Cardiovascular:     Rate and Rhythm: Normal rate.  Pulmonary:     Effort: Pulmonary effort is normal.  Skin:    General: Skin is dry.     Coloration: Skin is not pale.     Comments: Does have an erythematous maculopapular rash with some satellite lesions on the left side of her neck measuring 5 x 11 cm.  She does have a topical steroid cream on.  The incision appears to be well-healed.  Neurological:     Mental Status: She is alert and oriented to person, place, and time.  Psychiatric:        Behavior: Behavior normal.     No results found for any visits on 09/03/22.      Assessment & Plan:   Problem List Items Addressed This Visit       Nervous and Auditory   Metastasis to brain ([facility]    He is actually tolerated radiation therapy well.  We will have a follow-up scan likely in November.        Genitourinary   Recurrent UTI    He is feeling  much better.  I did complete her antibiotics for the UTI.      Relevant Medications   clotrimazole-betamethasone (LOTRISONE) cream     Other   Depression, recurrent (HCC)    Overall her depression has been fairly well controlled until her recent new diagnosis I feel like this is definitely acute distress.  She just feels like she is getting to the point where she is tired of just feeling like she constantly has to fight.  We discussed options her oncologist is actually getting try to get her connected with a therapist which if they are able to do that through the cancer center I think that would be fantastic.  In the meantime we did discuss medication which she has used short-term in the past.  We will start with a trial of Lexapro.      Relevant Medications    escitalopram (LEXAPRO) 10 MG tablet   Other Visit Diagnoses     Rash    -  Primary   Relevant Orders   B12   Fe+TIBC+Fer   TSH   Magnesium   Hair loss       Relevant Orders   B12   Fe+TIBC+Fer   TSH   Magnesium   Need for influenza vaccination       Relevant Orders   Flu Vaccine QUAD High Dose(Fluad) (Completed)       Rash-It has more of a yeast type in appearance.  Treat with Lotrisone cream.  If not improving over the next week then please let me know.  She could also be experiencing a contact dermatitis from the glue but it would be unusual to breakout almost a month later.  Hair loss-we discussed that can happen after surgery, stress and even with long doses of prednisone.  We will check for thyroid abnormality and deficiencies.  If normal then will monitor for new hair growth over the next 4 months.  Meds ordered this encounter  Medications   clotrimazole-betamethasone (LOTRISONE) cream    Sig: Apply 1 Application topically 2 (two) times daily. For 10-14 days    Dispense:  45 g    Refill:  0   escitalopram (LEXAPRO) 10 MG tablet    Sig: Take 0.5 tablets (5 mg total) by mouth daily for 8 days, THEN 1 tablet (10 mg total) daily for 22 days.    Dispense:  26 tablet    Refill:  0    Return in about 3 weeks (around 09/24/2022) for virtual visit for mood.  [name], MD

## 2022-09-03 NOTE — Assessment & Plan Note (Signed)
Overall her depression has been fairly well controlled until her recent new diagnosis I feel like this is definitely acute distress.  She just feels like she is getting to the point where she is tired of just feeling like she constantly has to fight.  We discussed options her oncologist is actually getting try to get her connected with a therapist which if they are able to do that through the cancer center I think that would be fantastic.  In the meantime we did discuss medication which she has used short-term in the past.  We will start with a trial of Lexapro.

## 2022-09-03 NOTE — Assessment & Plan Note (Signed)
He is actually tolerated radiation therapy well.  We will have a follow-up scan likely in November.

## 2022-09-04 ENCOUNTER — Encounter: Payer: Self-pay | Admitting: Family Medicine

## 2022-09-04 LAB — IRON,TIBC AND FERRITIN PANEL
%SAT: 11 % (calc) — ABNORMAL LOW (ref 16–45)
Ferritin: 155 ng/mL (ref 16–288)
Iron: 29 ug/dL — ABNORMAL LOW (ref 45–160)
TIBC: 273 mcg/dL (calc) (ref 250–450)

## 2022-09-04 LAB — MAGNESIUM: Magnesium: 1.9 mg/dL (ref 1.5–2.5)

## 2022-09-04 LAB — VITAMIN B12: Vitamin B-12: 501 pg/mL (ref 200–1100)

## 2022-09-04 LAB — TSH: TSH: 4.26 mIU/L (ref 0.40–4.50)

## 2022-09-04 NOTE — Progress Notes (Signed)
Hi Amy Wilson, your iron stores are definitely low.  Could definitely contribute to fatigue and tiredness.  Are you still taking extra iron?  I know it can be a little constipating.  You can take it with a stool softener or choose to take it every other day.  Thyroid, B12 and magnesium look good.

## 2022-09-09 ENCOUNTER — Telehealth: Payer: Self-pay

## 2022-09-09 NOTE — Telephone Encounter (Signed)
Amy Wilson with [facility] called. She reports heart rate of patient around 116-124 while at rest. She did notice she appeared dehydrated. Patient denies headaches, shortness of breath or chest pains. Amy Wilson did educated the importance of hydration.   Amy Wilson was sleeping 21 hours a day on the new medication (lexapro). She started the medication on Thursday and stopped the medication on Sunday due to sleeping all the time. They felt her sleeping 21 hours a day is the cause of the dehydration. She is not wanting to take any medication for depression at this time.

## 2022-09-21 DIAGNOSIS — R399 Unspecified symptoms and signs involving the genitourinary system: Secondary | ICD-10-CM | POA: Diagnosis not present

## 2022-09-24 ENCOUNTER — Telehealth: Payer: Medicare HMO | Admitting: Family Medicine

## 2022-10-06 ENCOUNTER — Telehealth: Payer: Self-pay

## 2022-10-06 NOTE — Patient Instructions (Signed)
Visit Information  Thank you for taking time to visit with me today. Please don't hesitate to contact me if I can be of assistance to you.   Following are the goals we discussed today:   Goals Addressed             This Visit's Progress    manage health       Care Coordination Interventions: Allowed son in law to discuss concerns regarding patient's health Encouraged son in law to discuss concerns with urologist and primary care provider to try to get a better understanding of condition and any other treatment regimens Reviewed upcoming appointments        Our next appointment is by telephone on 11/03/22 at 10:30 am  Please call the care guide team at [phone] if you need to cancel or reschedule your appointment.   If you are experiencing a Mental Health or [place] or need someone to talk to, please call the Suicide and Crisis Lifeline: 988  Patient verbalizes understanding of instructions and care plan provided today and agrees to view in [place]. Active MyChart status and patient understanding of how to access instructions and care plan via MyChart confirmed with patient.     [name], RN, MSN, BSN, [place] Coordinator [phone]

## 2022-10-06 NOTE — Patient Outreach (Signed)
  Care Coordination   Initial Visit Note   10/06/2022 Name: FREDDA CLARIDA MRN: [id number] DOB: August 09, 1951  [name] is a 71 y.o. year old female who sees Metheney, Rene Kocher, MD for primary care. I spoke with  [name] by phone today and she gave permission for Care coordinator to discuss health with son in law Consuelo Pandy who also assist with providing care to patient.  What matters to the patients health and wellness today?  RNCM received voice message from Mr. Priddy requesting a call back. RNCM called back and received consent from patient to speak with Mr. Erskin Burnet regarding her health. Mr Erskin Burnet expressed frustration and concerns that patient continues to have issues with frequent UTI. He expressed that he called the urologist today regarding another suspected UTI.  Mr. Erskin Burnet also reports patient has been receiving treatment for metastasis to brain, but states patient is not having any problems with the treatment.   Goals Addressed             This Visit's Progress    manage health       Care Coordination Interventions: Allowed son in law to discuss concerns regarding patient's health Encouraged son in law to discuss concerns with urologist and primary care provider to try to get a better understanding of condition and any other treatment regimens Reviewed upcoming appointments        SDOH assessments and interventions completed:  Yes  SDOH Interventions Today    Flowsheet Row Most Recent Value  SDOH Interventions   Food Insecurity Interventions Intervention Not Indicated  Transportation Interventions Intervention Not Indicated     Care Coordination Interventions Activated:  Yes  Care Coordination Interventions:  Yes, provided   Follow up plan: Follow up call scheduled for 11/03/22    Encounter Outcome:  Pt. Visit Completed

## 2022-10-07 DIAGNOSIS — R399 Unspecified symptoms and signs involving the genitourinary system: Secondary | ICD-10-CM | POA: Diagnosis not present

## 2022-10-26 ENCOUNTER — Ambulatory Visit (INDEPENDENT_AMBULATORY_CARE_PROVIDER_SITE_OTHER): Payer: Medicare HMO | Admitting: Family Medicine

## 2022-10-26 ENCOUNTER — Encounter: Payer: Self-pay | Admitting: Family Medicine

## 2022-10-26 VITALS — BP 108/65 | HR 80 | Temp 97.7°F

## 2022-10-26 DIAGNOSIS — F339 Major depressive disorder, recurrent, unspecified: Secondary | ICD-10-CM | POA: Diagnosis not present

## 2022-10-26 DIAGNOSIS — R5383 Other fatigue: Secondary | ICD-10-CM | POA: Diagnosis not present

## 2022-10-26 DIAGNOSIS — C7931 Secondary malignant neoplasm of brain: Secondary | ICD-10-CM | POA: Diagnosis not present

## 2022-10-26 DIAGNOSIS — I10 Essential (primary) hypertension: Secondary | ICD-10-CM | POA: Diagnosis not present

## 2022-10-26 DIAGNOSIS — N390 Urinary tract infection, site not specified: Secondary | ICD-10-CM

## 2022-10-26 DIAGNOSIS — R531 Weakness: Secondary | ICD-10-CM | POA: Diagnosis not present

## 2022-10-26 DIAGNOSIS — N1832 Chronic kidney disease, stage 3b: Secondary | ICD-10-CM

## 2022-10-26 DIAGNOSIS — R630 Anorexia: Secondary | ICD-10-CM | POA: Diagnosis not present

## 2022-10-26 NOTE — Assessment & Plan Note (Signed)
Blood pressure looks great today.  In fact she is no longer on medication for her blood pressure.

## 2022-10-26 NOTE — Assessment & Plan Note (Addendum)
He is planning on getting the stents removed hopefully this week.  She will see how things go but I am more than happy to place a new referral if needed.  She could also be a candidate for prophylactic treatment in addition to changing the stents but she does have a history of C. difficile.

## 2022-10-26 NOTE — Assessment & Plan Note (Signed)
Some additional labs today.  Just encouraged her to continue to work on staying active and moving as much as possible.

## 2022-10-26 NOTE — Progress Notes (Signed)
Established Patient Office Visit  Subjective   Patient ID: Amy Wilson, female    DOB: 1951/09/28  Age: 71 y.o. MRN: [id number]  Chief Complaint  Patient presents with   Referral    HPI They have been trying to get her in for a stent exchange in the next week or two. Feels she has had urinary sxs with strong odor and dark color for about 3 weeks.   Concerned she may have a UTI.    She is at the point where she is just frustrated with urology she just feels like they are really not listening and not really helping to answer her questions.  They are also disappointed that they really had to push to get her stents removed and they have been in for almost a year and she still keeps getting UTIs.  She also recently had C. difficile.  He is scheduled for an MRI on Thursday for her brain and neck to follow-up on the metastases.  But she is otherwise actually doing well in that regard.  Still just feels really fatigued and feels weak in the mornings.  She tries to move her limbs a lot but is just not very active.  She would like to have some labs done today.  She is also just had a really poor appetite.  It is really up and down.      ROS    Objective:     BP 108/65   Pulse 80   Temp 97.7 F (36.5 C)   SpO2 100%    Physical Exam Vitals reviewed.  Constitutional:      Appearance: She is well-developed.  HENT:     Head: Normocephalic and atraumatic.  Eyes:     Conjunctiva/sclera: Conjunctivae normal.  Cardiovascular:     Rate and Rhythm: Normal rate.  Pulmonary:     Effort: Pulmonary effort is normal.  Skin:    General: Skin is dry.     Coloration: Skin is not pale.  Neurological:     Mental Status: She is alert and oriented to person, place, and time.  Psychiatric:        Behavior: Behavior normal.      No results found for any visits on 10/26/22.    The 10-year ASCVD risk score (Arnett DK, et al., 2019) is: 17.6%    Assessment & Plan:   Problem List  Items Addressed This Visit       Cardiovascular and Mediastinum   HYPERTENSION, BENIGN SYSTEMIC - Primary    Blood pressure looks great today.  In fact she is no longer on medication for her blood pressure.      Relevant Orders   COMPLETE METABOLIC PANEL WITH GFR   Fe+TIBC+Fer   Vitamin B1   Vitamin B6     Nervous and Auditory   Metastasis to brain (HCC)   Relevant Orders   COMPLETE METABOLIC PANEL WITH GFR   Fe+TIBC+Fer   Vitamin B1   Vitamin B6     Genitourinary   Stage 3b chronic kidney disease ([place])    Recheck serum creatinine.      Relevant Orders   COMPLETE METABOLIC PANEL WITH GFR   Fe+TIBC+Fer   Vitamin B1   Vitamin B6   Recurrent UTI    He is planning on getting the stents removed hopefully this week.  She will see how things go but I am more than happy to place a new referral if needed.  She could also  be a candidate for prophylactic treatment in addition to changing the stents but she does have a history of C. difficile.      Relevant Orders   COMPLETE METABOLIC PANEL WITH GFR   Fe+TIBC+Fer   Vitamin B1   Vitamin B6     Other   Generalized weakness    Some additional labs today.  Just encouraged her to continue to work on staying active and moving as much as possible.      Depression, recurrent ([place])    Still having some persistent symptoms.  Not currently on any medication including the Lexapro which she did not tolerate well.      Other Visit Diagnoses     Other fatigue       Relevant Orders   COMPLETE METABOLIC PANEL WITH GFR   Fe+TIBC+Fer   Vitamin B1   Vitamin B6   Poor appetite          Fatigue-we will do some additional lab work-up today and call with results.  Due to recheck liver enzymes.  Return in about 3 months (around 01/26/2023) for kidneys, BP.    [name], MD

## 2022-10-26 NOTE — Assessment & Plan Note (Signed)
Recheck serum creatinine.

## 2022-10-26 NOTE — Assessment & Plan Note (Signed)
Still having some persistent symptoms.  Not currently on any medication including the Lexapro which she did not tolerate well.

## 2022-10-27 NOTE — Progress Notes (Signed)
Amy Wilson, kidney function is stable at 1.0.  Routine level was a little borderline low.  So just make sure you are try to get enough protein in daily.  Your total iron is still low similar to what it was about a month ago.  Do think this could be contributing to your fatigue.  We could certainly talk to oncology about whether or not you might benefit from some extra iron infusion.  Vitamin B1 and B6 are still pending.

## 2022-10-29 DIAGNOSIS — C7931 Secondary malignant neoplasm of brain: Secondary | ICD-10-CM | POA: Diagnosis not present

## 2022-10-29 DIAGNOSIS — R9082 White matter disease, unspecified: Secondary | ICD-10-CM | POA: Diagnosis not present

## 2022-10-29 DIAGNOSIS — C541 Malignant neoplasm of endometrium: Secondary | ICD-10-CM | POA: Diagnosis not present

## 2022-10-30 LAB — COMPLETE METABOLIC PANEL WITH GFR
AG Ratio: 1 (calc) (ref 1.0–2.5)
ALT: 7 U/L (ref 6–29)
AST: 11 U/L (ref 10–35)
Albumin: 3.5 g/dL — ABNORMAL LOW (ref 3.6–5.1)
Alkaline phosphatase (APISO): 56 U/L (ref 37–153)
BUN/Creatinine Ratio: 23 (calc) — ABNORMAL HIGH (ref 6–22)
BUN: 24 mg/dL (ref 7–25)
CO2: 26 mmol/L (ref 20–32)
Calcium: 9.4 mg/dL (ref 8.6–10.4)
Chloride: 105 mmol/L (ref 98–110)
Creat: 1.04 mg/dL — ABNORMAL HIGH (ref 0.60–1.00)
Globulin: 3.6 g/dL (calc) (ref 1.9–3.7)
Glucose, Bld: 106 mg/dL — ABNORMAL HIGH (ref 65–99)
Potassium: 5.3 mmol/L (ref 3.5–5.3)
Sodium: 140 mmol/L (ref 135–146)
Total Bilirubin: 0.4 mg/dL (ref 0.2–1.2)
Total Protein: 7.1 g/dL (ref 6.1–8.1)
eGFR: 57 mL/min/{1.73_m2} — ABNORMAL LOW (ref >=60)

## 2022-10-30 LAB — IRON,TIBC AND FERRITIN PANEL
%SAT: 11 % (calc) — ABNORMAL LOW (ref 16–45)
Ferritin: 104 ng/mL (ref 16–288)
Iron: 27 ug/dL — ABNORMAL LOW (ref 45–160)
TIBC: 249 mcg/dL (calc) — ABNORMAL LOW (ref 250–450)

## 2022-10-30 LAB — VITAMIN B6: Vitamin B6: 5.3 ng/mL (ref 2.1–21.7)

## 2022-10-30 LAB — VITAMIN B1: Vitamin B1 (Thiamine): 9 nmol/L (ref 8–30)

## 2022-10-30 NOTE — Progress Notes (Signed)
Amy Wilson, both vitamin B1 and B6 are technically normal, but they are definitely on the low end.  I would recommend taking an over-the-counter supplement for B1 and B6.  You could consider doing a B complex as long as it has B1 and B6 in it to make it easier.  But I do think this could be helpful.  Helps your nerve tissue stay healthy.

## 2022-11-02 DIAGNOSIS — C541 Malignant neoplasm of endometrium: Secondary | ICD-10-CM | POA: Diagnosis not present

## 2022-11-02 DIAGNOSIS — N135 Crossing vessel and stricture of ureter without hydronephrosis: Secondary | ICD-10-CM | POA: Diagnosis not present

## 2022-11-02 DIAGNOSIS — C799 Secondary malignant neoplasm of unspecified site: Secondary | ICD-10-CM | POA: Diagnosis not present

## 2022-11-02 DIAGNOSIS — N1339 Other hydronephrosis: Secondary | ICD-10-CM | POA: Diagnosis not present

## 2022-11-02 DIAGNOSIS — T8389XA Other specified complication of genitourinary prosthetic devices, implants and grafts, initial encounter: Secondary | ICD-10-CM | POA: Diagnosis not present

## 2022-11-03 DIAGNOSIS — C541 Malignant neoplasm of endometrium: Secondary | ICD-10-CM | POA: Diagnosis not present

## 2022-11-03 DIAGNOSIS — D508 Other iron deficiency anemias: Secondary | ICD-10-CM | POA: Diagnosis not present

## 2022-11-03 DIAGNOSIS — G939 Disorder of brain, unspecified: Secondary | ICD-10-CM | POA: Diagnosis not present

## 2022-11-05 DIAGNOSIS — C7931 Secondary malignant neoplasm of brain: Secondary | ICD-10-CM | POA: Diagnosis not present

## 2022-11-05 DIAGNOSIS — C541 Malignant neoplasm of endometrium: Secondary | ICD-10-CM | POA: Diagnosis not present

## 2022-11-16 ENCOUNTER — Telehealth: Payer: Self-pay

## 2022-11-16 NOTE — Patient Outreach (Signed)
  Care Coordination   11/16/2022 Name: Amy Wilson MRN: [id number] DOB: 10/13/1951   Care Coordination Outreach Attempts:  An unsuccessful telephone outreach was attempted today to offer the patient information about available care coordination services as a benefit of their health plan.   Follow Up Plan:  Additional outreach attempts will be made to offer the patient care coordination information and services.   Encounter Outcome:  No Answer   Care Coordination Interventions:  No, not indicated    [name], RN, MSN, BSN, [place] Coordinator [phone]

## 2022-11-20 ENCOUNTER — Telehealth: Payer: Self-pay | Admitting: *Deleted

## 2022-11-20 NOTE — Progress Notes (Signed)
  Care Coordination Note  11/20/2022 Name: Amy Wilson MRN: [id number] DOB: [birth year]  Amy Wilson is a 71 y.o. year old female who is a primary care patient of Hali Marry, MD and is actively engaged with the care management team. I reached out to Amy Wilson by phone today to assist with re-scheduling a follow up visit with the RN Case Manager  Follow up plan: Unsuccessful telephone outreach attempt made. A HIPAA compliant phone message was left for the patient providing contact information and requesting a return call.   Julian Hy, [place] Direct Dial: [phone]

## 2022-12-18 ENCOUNTER — Ambulatory Visit (INDEPENDENT_AMBULATORY_CARE_PROVIDER_SITE_OTHER): Payer: Medicare HMO | Admitting: Family Medicine

## 2022-12-18 ENCOUNTER — Encounter: Payer: Self-pay | Admitting: Family Medicine

## 2022-12-18 VITALS — BP 106/72 | HR 102 | Temp 98.8°F | Ht 65.0 in

## 2022-12-18 DIAGNOSIS — B9620 Unspecified Escherichia coli [E. coli] as the cause of diseases classified elsewhere: Secondary | ICD-10-CM | POA: Diagnosis not present

## 2022-12-18 DIAGNOSIS — C7989 Secondary malignant neoplasm of other specified sites: Secondary | ICD-10-CM | POA: Diagnosis not present

## 2022-12-18 DIAGNOSIS — R531 Weakness: Secondary | ICD-10-CM | POA: Diagnosis not present

## 2022-12-18 DIAGNOSIS — R051 Acute cough: Secondary | ICD-10-CM | POA: Diagnosis not present

## 2022-12-18 DIAGNOSIS — R1084 Generalized abdominal pain: Secondary | ICD-10-CM | POA: Diagnosis not present

## 2022-12-18 DIAGNOSIS — R112 Nausea with vomiting, unspecified: Secondary | ICD-10-CM | POA: Diagnosis not present

## 2022-12-18 DIAGNOSIS — E872 Acidosis, unspecified: Secondary | ICD-10-CM | POA: Diagnosis not present

## 2022-12-18 DIAGNOSIS — C799 Secondary malignant neoplasm of unspecified site: Secondary | ICD-10-CM | POA: Diagnosis not present

## 2022-12-18 DIAGNOSIS — C775 Secondary and unspecified malignant neoplasm of intrapelvic lymph nodes: Secondary | ICD-10-CM | POA: Diagnosis not present

## 2022-12-18 DIAGNOSIS — R7881 Bacteremia: Secondary | ICD-10-CM | POA: Diagnosis not present

## 2022-12-18 DIAGNOSIS — Z7189 Other specified counseling: Secondary | ICD-10-CM | POA: Diagnosis not present

## 2022-12-18 DIAGNOSIS — C801 Malignant (primary) neoplasm, unspecified: Secondary | ICD-10-CM | POA: Diagnosis not present

## 2022-12-18 DIAGNOSIS — K760 Fatty (change of) liver, not elsewhere classified: Secondary | ICD-10-CM | POA: Diagnosis not present

## 2022-12-18 DIAGNOSIS — J984 Other disorders of lung: Secondary | ICD-10-CM | POA: Diagnosis not present

## 2022-12-18 DIAGNOSIS — Z515 Encounter for palliative care: Secondary | ICD-10-CM | POA: Diagnosis not present

## 2022-12-18 DIAGNOSIS — N3001 Acute cystitis with hematuria: Secondary | ICD-10-CM | POA: Diagnosis not present

## 2022-12-18 DIAGNOSIS — R590 Localized enlarged lymph nodes: Secondary | ICD-10-CM | POA: Diagnosis not present

## 2022-12-18 DIAGNOSIS — G620 Drug-induced polyneuropathy: Secondary | ICD-10-CM | POA: Diagnosis not present

## 2022-12-18 DIAGNOSIS — C7931 Secondary malignant neoplasm of brain: Secondary | ICD-10-CM | POA: Diagnosis not present

## 2022-12-18 DIAGNOSIS — R5381 Other malaise: Secondary | ICD-10-CM | POA: Diagnosis not present

## 2022-12-18 DIAGNOSIS — E860 Dehydration: Secondary | ICD-10-CM | POA: Diagnosis not present

## 2022-12-18 DIAGNOSIS — N1330 Unspecified hydronephrosis: Secondary | ICD-10-CM | POA: Diagnosis not present

## 2022-12-18 DIAGNOSIS — C541 Malignant neoplasm of endometrium: Secondary | ICD-10-CM | POA: Diagnosis not present

## 2022-12-18 DIAGNOSIS — C772 Secondary and unspecified malignant neoplasm of intra-abdominal lymph nodes: Secondary | ICD-10-CM | POA: Diagnosis not present

## 2022-12-18 DIAGNOSIS — E876 Hypokalemia: Secondary | ICD-10-CM | POA: Diagnosis not present

## 2022-12-18 DIAGNOSIS — Z8589 Personal history of malignant neoplasm of other organs and systems: Secondary | ICD-10-CM | POA: Diagnosis not present

## 2022-12-18 DIAGNOSIS — R509 Fever, unspecified: Secondary | ICD-10-CM | POA: Diagnosis not present

## 2022-12-18 DIAGNOSIS — N3289 Other specified disorders of bladder: Secondary | ICD-10-CM | POA: Diagnosis not present

## 2022-12-18 LAB — POCT INFLUENZA A/B
Influenza A, POC: NEGATIVE
Influenza B, POC: NEGATIVE

## 2022-12-18 LAB — POC COVID19 BINAXNOW: SARS Coronavirus 2 Ag: NEGATIVE

## 2022-12-18 NOTE — Progress Notes (Signed)
Acute Office Visit  Subjective:     Patient ID: Amy Wilson, female    DOB: [birth year], 71 y.o.   MRN: [id number]  Chief Complaint  Patient presents with   Acute Visit    Patient in office with son-in-law "joseph" ( lives with him and his wife) - c/o fatigue, weak, bodyaches, chills,cough, slight vomiting, ( not eaten in 36 hours) x yesterday a.m. -daughter is also a little sick as well.     HPI Patient is in today for acute visit.  Pt is brand new to me today and unsure of baseline.   Pt is with her son in [employer]. They are the caregiver of pt along with his wife. Wife has been ill for the last few days. Pt started having sick symptoms such as cough, body aches, fatigue and weak that all started yesterday. Cough is mild. Pt reports her worst symptom is feeling weak all over. No sore throat. Pt reports vomited a few times when coughing but this is mild. Son in law says she's is chronically weak but has worsened over the last few months. She is wheelchair bound for several months. 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 Completed radiation as of 07/2022. Son in law says she doesn't really drink much and hasn't eaten in the last few days. Unable to weigh patient today due to severe weakness.    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   Left leg weakness 06/02/2021   Recurrent UTI 11/27/2020   Mixed stress and urge urinary incontinence 10/22/2020   Generalized weakness 10/22/2020   Cerebellar ataxia in diseases classified elsewhere ([place]) 08/08/2020   Retroperitoneal lymphadenopathy 08/08/2020   Peripheral neuropathic pain 07/22/2020   GERD (gastroesophageal reflux disease) 07/03/2020   Stage 3b chronic kidney disease ([place])  07/03/2020   Bilateral ureteral obstruction 05/27/2020   Hydronephrosis 05/18/2019   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[place]) 05/03/2019   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[place]) 05/03/2019   Endometrial adenocarcinoma ([place]) 03/03/2019   Closed compression fracture of body of L1 vertebra ([place]) 02/24/2019   Age-related osteoporosis with current pathological fracture with routine healing 02/24/2019   Moderate to severe pulmonary hypertension ([place]) 02/21/2019   Pelvic mass 02/20/2019   Hiatal hernia 02/20/2019   Fibroid uterus 02/20/2019   Bladder wall thickening 02/20/2019   Aortic atherosclerosis ([place]) 02/20/2019   Obesity (BMI 30-39.9) 10/05/2014   ARM PAIN, LEFT 10/13/2010   INSOMNIA 05/13/2009   DIZZINESS 09/13/2007   Hyperlipidemia 08/19/2007   Depression, recurrent ([place]) 09/28/2006   HYPERTENSION, BENIGN SYSTEMIC 09/28/2006   MENOPAUSAL SYNDROME 09/28/2006   VASOVAGAL SYNCOPE 09/28/2006   FATIGUE/MALAISE 09/28/2006    Review of Systems  Constitutional:  Positive for malaise/fatigue and weight loss. Negative for fever.  HENT:  Negative for congestion, sinus pain and sore throat.   Eyes:  Negative for blurred vision and pain.  Respiratory:  Positive for cough. Negative for shortness of breath and wheezing.   Cardiovascular:  Negative for chest pain and palpitations.  Musculoskeletal:  Negative for myalgias and neck pain.  Psychiatric/Behavioral:  Negative for depression. The patient is not nervous/anxious.   All other systems reviewed and are negative.       Objective:  BP 106/72   Pulse (!) 102   Temp 98.8 F (37.1 C)   Ht '5\' 5"'$  (1.651 m)   SpO2 98%   BMI 24.79 kg/m    Physical Exam Vitals and nursing note reviewed.  Constitutional:      General: She is not in acute distress.    Appearance: Normal appearance. She is ill-appearing. She is not toxic-appearing.  HENT:     Head: Normocephalic and atraumatic.     Right Ear: Tympanic membrane,  ear canal and external ear normal.     Left Ear: Tympanic membrane, ear canal and external ear normal.     Nose: Nose normal.     Mouth/Throat:     Mouth: Mucous membranes are dry.     Comments: Dry tongue Eyes:     Conjunctiva/sclera: Conjunctivae normal.     Pupils: Pupils are equal, round, and reactive to light.  Cardiovascular:     Rate and Rhythm: Regular rhythm. Tachycardia present.     Pulses: Normal pulses.     Heart sounds: Normal heart sounds.  Pulmonary:     Effort: Pulmonary effort is normal. No respiratory distress.     Breath sounds: Normal breath sounds. No wheezing.  Skin:    Capillary Refill: Capillary refill takes less than 2 seconds.  Neurological:     General: No focal deficit present.     Mental Status: She is alert and oriented to person, place, and time. Mental status is at baseline.     Motor: Weakness present.  Psychiatric:        Mood and Affect: Mood normal.        Behavior: Behavior normal.        Thought Content: Thought content normal.        Judgment: Judgment normal.     No results found for any visits on 12/18/22.      Assessment & Plan:   Problem List Items Addressed This Visit   None Visit Diagnoses     Acute cough    -  Primary   Relevant Orders   POCT Influenza A/B   POC COVID-19   Severe dehydration       Weakness generalized         Flu and covid negative. Suspect dehydration with clinical exam and history. Not eating or drinking well in the last few days.  Advised to go to ER for further evaluation with labs and IVF's. Pt and son in law agrees with plan of care.  No orders of the defined types were placed in this encounter.   No follow-ups on file.  Leeanne Rio, MD

## 2022-12-19 DIAGNOSIS — R531 Weakness: Secondary | ICD-10-CM | POA: Diagnosis not present

## 2022-12-19 DIAGNOSIS — C799 Secondary malignant neoplasm of unspecified site: Secondary | ICD-10-CM | POA: Diagnosis not present

## 2022-12-19 DIAGNOSIS — R112 Nausea with vomiting, unspecified: Secondary | ICD-10-CM | POA: Diagnosis not present

## 2022-12-20 DIAGNOSIS — N3001 Acute cystitis with hematuria: Secondary | ICD-10-CM | POA: Diagnosis not present

## 2022-12-20 DIAGNOSIS — J984 Other disorders of lung: Secondary | ICD-10-CM | POA: Diagnosis not present

## 2022-12-20 DIAGNOSIS — R509 Fever, unspecified: Secondary | ICD-10-CM | POA: Diagnosis not present

## 2022-12-20 DIAGNOSIS — R7881 Bacteremia: Secondary | ICD-10-CM | POA: Diagnosis not present

## 2022-12-21 DIAGNOSIS — R7881 Bacteremia: Secondary | ICD-10-CM | POA: Diagnosis not present

## 2022-12-22 DIAGNOSIS — R7881 Bacteremia: Secondary | ICD-10-CM | POA: Diagnosis not present

## 2022-12-22 DIAGNOSIS — Z515 Encounter for palliative care: Secondary | ICD-10-CM | POA: Diagnosis not present

## 2022-12-22 DIAGNOSIS — G620 Drug-induced polyneuropathy: Secondary | ICD-10-CM | POA: Diagnosis not present

## 2022-12-22 DIAGNOSIS — C541 Malignant neoplasm of endometrium: Secondary | ICD-10-CM | POA: Diagnosis not present

## 2022-12-22 DIAGNOSIS — Z7189 Other specified counseling: Secondary | ICD-10-CM | POA: Diagnosis not present

## 2022-12-22 DIAGNOSIS — R112 Nausea with vomiting, unspecified: Secondary | ICD-10-CM | POA: Diagnosis not present

## 2022-12-22 DIAGNOSIS — Z8589 Personal history of malignant neoplasm of other organs and systems: Secondary | ICD-10-CM | POA: Diagnosis not present

## 2022-12-22 DIAGNOSIS — R1084 Generalized abdominal pain: Secondary | ICD-10-CM | POA: Diagnosis not present

## 2022-12-22 DIAGNOSIS — R5381 Other malaise: Secondary | ICD-10-CM | POA: Diagnosis not present

## 2022-12-22 DIAGNOSIS — N3001 Acute cystitis with hematuria: Secondary | ICD-10-CM | POA: Diagnosis not present

## 2022-12-23 DIAGNOSIS — R7881 Bacteremia: Secondary | ICD-10-CM | POA: Diagnosis not present

## 2022-12-23 DIAGNOSIS — Z8589 Personal history of malignant neoplasm of other organs and systems: Secondary | ICD-10-CM | POA: Diagnosis not present

## 2022-12-23 DIAGNOSIS — N3001 Acute cystitis with hematuria: Secondary | ICD-10-CM | POA: Diagnosis not present

## 2022-12-23 DIAGNOSIS — R112 Nausea with vomiting, unspecified: Secondary | ICD-10-CM | POA: Diagnosis not present

## 2022-12-23 DIAGNOSIS — R1084 Generalized abdominal pain: Secondary | ICD-10-CM | POA: Diagnosis not present

## 2022-12-23 DIAGNOSIS — G620 Drug-induced polyneuropathy: Secondary | ICD-10-CM | POA: Diagnosis not present

## 2022-12-23 DIAGNOSIS — C541 Malignant neoplasm of endometrium: Secondary | ICD-10-CM | POA: Diagnosis not present

## 2022-12-23 DIAGNOSIS — Z515 Encounter for palliative care: Secondary | ICD-10-CM | POA: Diagnosis not present

## 2022-12-23 DIAGNOSIS — Z7189 Other specified counseling: Secondary | ICD-10-CM | POA: Diagnosis not present

## 2022-12-23 DIAGNOSIS — R5381 Other malaise: Secondary | ICD-10-CM | POA: Diagnosis not present

## 2022-12-24 DIAGNOSIS — Z743 Need for continuous supervision: Secondary | ICD-10-CM | POA: Diagnosis not present

## 2022-12-24 DIAGNOSIS — R251 Tremor, unspecified: Secondary | ICD-10-CM | POA: Diagnosis not present

## 2022-12-24 DIAGNOSIS — G40802 Other epilepsy, not intractable, without status epilepticus: Secondary | ICD-10-CM | POA: Diagnosis not present

## 2022-12-24 DIAGNOSIS — N1831 Chronic kidney disease, stage 3a: Secondary | ICD-10-CM | POA: Diagnosis not present

## 2022-12-24 DIAGNOSIS — R197 Diarrhea, unspecified: Secondary | ICD-10-CM | POA: Diagnosis not present

## 2022-12-24 DIAGNOSIS — N3001 Acute cystitis with hematuria: Secondary | ICD-10-CM | POA: Diagnosis not present

## 2022-12-24 DIAGNOSIS — J9811 Atelectasis: Secondary | ICD-10-CM | POA: Diagnosis not present

## 2022-12-24 DIAGNOSIS — R531 Weakness: Secondary | ICD-10-CM | POA: Diagnosis not present

## 2022-12-24 DIAGNOSIS — R4182 Altered mental status, unspecified: Secondary | ICD-10-CM | POA: Diagnosis not present

## 2022-12-24 DIAGNOSIS — R627 Adult failure to thrive: Secondary | ICD-10-CM | POA: Diagnosis not present

## 2022-12-24 DIAGNOSIS — G936 Cerebral edema: Secondary | ICD-10-CM | POA: Diagnosis not present

## 2022-12-24 DIAGNOSIS — G893 Neoplasm related pain (acute) (chronic): Secondary | ICD-10-CM | POA: Diagnosis not present

## 2022-12-24 DIAGNOSIS — R590 Localized enlarged lymph nodes: Secondary | ICD-10-CM | POA: Diagnosis not present

## 2022-12-24 DIAGNOSIS — C799 Secondary malignant neoplasm of unspecified site: Secondary | ICD-10-CM | POA: Diagnosis not present

## 2022-12-24 DIAGNOSIS — R001 Bradycardia, unspecified: Secondary | ICD-10-CM | POA: Diagnosis not present

## 2022-12-24 DIAGNOSIS — R112 Nausea with vomiting, unspecified: Secondary | ICD-10-CM | POA: Diagnosis not present

## 2022-12-24 DIAGNOSIS — R630 Anorexia: Secondary | ICD-10-CM | POA: Diagnosis not present

## 2022-12-24 DIAGNOSIS — C541 Malignant neoplasm of endometrium: Secondary | ICD-10-CM | POA: Diagnosis not present

## 2022-12-24 DIAGNOSIS — R7881 Bacteremia: Secondary | ICD-10-CM | POA: Diagnosis not present

## 2022-12-24 DIAGNOSIS — I959 Hypotension, unspecified: Secondary | ICD-10-CM | POA: Diagnosis not present

## 2022-12-24 DIAGNOSIS — Z515 Encounter for palliative care: Secondary | ICD-10-CM | POA: Diagnosis not present

## 2022-12-24 DIAGNOSIS — Z66 Do not resuscitate: Secondary | ICD-10-CM | POA: Diagnosis not present

## 2022-12-24 DIAGNOSIS — C719 Malignant neoplasm of brain, unspecified: Secondary | ICD-10-CM | POA: Diagnosis not present

## 2022-12-24 DIAGNOSIS — C801 Malignant (primary) neoplasm, unspecified: Secondary | ICD-10-CM | POA: Diagnosis not present

## 2022-12-24 DIAGNOSIS — R29898 Other symptoms and signs involving the musculoskeletal system: Secondary | ICD-10-CM | POA: Diagnosis not present

## 2022-12-24 DIAGNOSIS — C7931 Secondary malignant neoplasm of brain: Secondary | ICD-10-CM | POA: Diagnosis not present

## 2022-12-24 DIAGNOSIS — D61818 Other pancytopenia: Secondary | ICD-10-CM | POA: Diagnosis not present

## 2022-12-24 DIAGNOSIS — R000 Tachycardia, unspecified: Secondary | ICD-10-CM | POA: Diagnosis not present

## 2022-12-24 DIAGNOSIS — C7951 Secondary malignant neoplasm of bone: Secondary | ICD-10-CM | POA: Diagnosis not present

## 2022-12-24 DIAGNOSIS — N1330 Unspecified hydronephrosis: Secondary | ICD-10-CM | POA: Diagnosis not present

## 2022-12-24 DIAGNOSIS — G8191 Hemiplegia, unspecified affecting right dominant side: Secondary | ICD-10-CM | POA: Diagnosis not present

## 2022-12-24 DIAGNOSIS — K388 Other specified diseases of appendix: Secondary | ICD-10-CM | POA: Diagnosis not present

## 2022-12-24 DIAGNOSIS — J3489 Other specified disorders of nose and nasal sinuses: Secondary | ICD-10-CM | POA: Diagnosis not present

## 2022-12-24 DIAGNOSIS — R569 Unspecified convulsions: Secondary | ICD-10-CM | POA: Diagnosis not present

## 2022-12-24 DIAGNOSIS — E872 Acidosis, unspecified: Secondary | ICD-10-CM | POA: Diagnosis not present

## 2022-12-24 DIAGNOSIS — E876 Hypokalemia: Secondary | ICD-10-CM | POA: Diagnosis not present

## 2022-12-24 DIAGNOSIS — N390 Urinary tract infection, site not specified: Secondary | ICD-10-CM | POA: Diagnosis not present

## 2022-12-24 DIAGNOSIS — I2510 Atherosclerotic heart disease of native coronary artery without angina pectoris: Secondary | ICD-10-CM | POA: Diagnosis not present

## 2022-12-25 DIAGNOSIS — J9811 Atelectasis: Secondary | ICD-10-CM | POA: Diagnosis not present

## 2022-12-25 DIAGNOSIS — R569 Unspecified convulsions: Secondary | ICD-10-CM | POA: Diagnosis not present

## 2022-12-25 DIAGNOSIS — J3489 Other specified disorders of nose and nasal sinuses: Secondary | ICD-10-CM | POA: Diagnosis not present

## 2022-12-25 DIAGNOSIS — R590 Localized enlarged lymph nodes: Secondary | ICD-10-CM | POA: Diagnosis not present

## 2022-12-25 DIAGNOSIS — C7951 Secondary malignant neoplasm of bone: Secondary | ICD-10-CM | POA: Diagnosis not present

## 2022-12-25 DIAGNOSIS — G893 Neoplasm related pain (acute) (chronic): Secondary | ICD-10-CM | POA: Diagnosis not present

## 2022-12-25 DIAGNOSIS — R630 Anorexia: Secondary | ICD-10-CM | POA: Diagnosis not present

## 2022-12-25 DIAGNOSIS — Z515 Encounter for palliative care: Secondary | ICD-10-CM | POA: Diagnosis not present

## 2022-12-25 DIAGNOSIS — C801 Malignant (primary) neoplasm, unspecified: Secondary | ICD-10-CM | POA: Diagnosis not present

## 2022-12-25 DIAGNOSIS — N3001 Acute cystitis with hematuria: Secondary | ICD-10-CM | POA: Diagnosis not present

## 2022-12-25 DIAGNOSIS — C7931 Secondary malignant neoplasm of brain: Secondary | ICD-10-CM | POA: Diagnosis not present

## 2022-12-25 DIAGNOSIS — K388 Other specified diseases of appendix: Secondary | ICD-10-CM | POA: Diagnosis not present

## 2022-12-25 DIAGNOSIS — N1831 Chronic kidney disease, stage 3a: Secondary | ICD-10-CM | POA: Diagnosis not present

## 2022-12-25 DIAGNOSIS — N1330 Unspecified hydronephrosis: Secondary | ICD-10-CM | POA: Diagnosis not present

## 2022-12-25 DIAGNOSIS — G936 Cerebral edema: Secondary | ICD-10-CM | POA: Diagnosis not present

## 2022-12-25 DIAGNOSIS — I2510 Atherosclerotic heart disease of native coronary artery without angina pectoris: Secondary | ICD-10-CM | POA: Diagnosis not present

## 2022-12-25 DIAGNOSIS — R112 Nausea with vomiting, unspecified: Secondary | ICD-10-CM | POA: Diagnosis not present

## 2022-12-25 DIAGNOSIS — C541 Malignant neoplasm of endometrium: Secondary | ICD-10-CM | POA: Diagnosis not present

## 2022-12-26 DIAGNOSIS — N1831 Chronic kidney disease, stage 3a: Secondary | ICD-10-CM | POA: Diagnosis not present

## 2022-12-26 DIAGNOSIS — R569 Unspecified convulsions: Secondary | ICD-10-CM | POA: Diagnosis not present

## 2022-12-26 DIAGNOSIS — N3001 Acute cystitis with hematuria: Secondary | ICD-10-CM | POA: Diagnosis not present

## 2022-12-26 DIAGNOSIS — G936 Cerebral edema: Secondary | ICD-10-CM | POA: Diagnosis not present

## 2022-12-26 DIAGNOSIS — R112 Nausea with vomiting, unspecified: Secondary | ICD-10-CM | POA: Diagnosis not present

## 2022-12-26 DIAGNOSIS — C7931 Secondary malignant neoplasm of brain: Secondary | ICD-10-CM | POA: Diagnosis not present

## 2022-12-26 DIAGNOSIS — R630 Anorexia: Secondary | ICD-10-CM | POA: Diagnosis not present

## 2022-12-26 DIAGNOSIS — G893 Neoplasm related pain (acute) (chronic): Secondary | ICD-10-CM | POA: Diagnosis not present

## 2022-12-26 DIAGNOSIS — Z515 Encounter for palliative care: Secondary | ICD-10-CM | POA: Diagnosis not present

## 2022-12-27 DIAGNOSIS — G936 Cerebral edema: Secondary | ICD-10-CM | POA: Diagnosis not present

## 2022-12-27 DIAGNOSIS — N3001 Acute cystitis with hematuria: Secondary | ICD-10-CM | POA: Diagnosis not present

## 2022-12-27 DIAGNOSIS — R112 Nausea with vomiting, unspecified: Secondary | ICD-10-CM | POA: Diagnosis not present

## 2022-12-27 DIAGNOSIS — R630 Anorexia: Secondary | ICD-10-CM | POA: Diagnosis not present

## 2022-12-27 DIAGNOSIS — Z515 Encounter for palliative care: Secondary | ICD-10-CM | POA: Diagnosis not present

## 2022-12-27 DIAGNOSIS — R569 Unspecified convulsions: Secondary | ICD-10-CM | POA: Diagnosis not present

## 2022-12-27 DIAGNOSIS — N1831 Chronic kidney disease, stage 3a: Secondary | ICD-10-CM | POA: Diagnosis not present

## 2022-12-27 DIAGNOSIS — G893 Neoplasm related pain (acute) (chronic): Secondary | ICD-10-CM | POA: Diagnosis not present

## 2022-12-27 DIAGNOSIS — C7931 Secondary malignant neoplasm of brain: Secondary | ICD-10-CM | POA: Diagnosis not present

## 2022-12-28 DIAGNOSIS — R627 Adult failure to thrive: Secondary | ICD-10-CM | POA: Diagnosis not present

## 2022-12-28 DIAGNOSIS — R569 Unspecified convulsions: Secondary | ICD-10-CM | POA: Diagnosis not present

## 2022-12-28 DIAGNOSIS — N1831 Chronic kidney disease, stage 3a: Secondary | ICD-10-CM | POA: Diagnosis not present

## 2022-12-28 DIAGNOSIS — R112 Nausea with vomiting, unspecified: Secondary | ICD-10-CM | POA: Diagnosis not present

## 2022-12-28 DIAGNOSIS — G936 Cerebral edema: Secondary | ICD-10-CM | POA: Diagnosis not present

## 2022-12-28 DIAGNOSIS — N3001 Acute cystitis with hematuria: Secondary | ICD-10-CM | POA: Diagnosis not present

## 2022-12-28 DIAGNOSIS — C799 Secondary malignant neoplasm of unspecified site: Secondary | ICD-10-CM | POA: Diagnosis not present

## 2022-12-28 DIAGNOSIS — C7931 Secondary malignant neoplasm of brain: Secondary | ICD-10-CM | POA: Diagnosis not present

## 2022-12-29 DIAGNOSIS — N1831 Chronic kidney disease, stage 3a: Secondary | ICD-10-CM | POA: Diagnosis not present

## 2022-12-29 DIAGNOSIS — G936 Cerebral edema: Secondary | ICD-10-CM | POA: Diagnosis not present

## 2022-12-29 DIAGNOSIS — C7931 Secondary malignant neoplasm of brain: Secondary | ICD-10-CM | POA: Diagnosis not present

## 2022-12-29 DIAGNOSIS — N3001 Acute cystitis with hematuria: Secondary | ICD-10-CM | POA: Diagnosis not present

## 2022-12-29 DIAGNOSIS — R569 Unspecified convulsions: Secondary | ICD-10-CM | POA: Diagnosis not present

## 2022-12-30 DIAGNOSIS — R569 Unspecified convulsions: Secondary | ICD-10-CM | POA: Diagnosis not present

## 2022-12-30 DIAGNOSIS — G936 Cerebral edema: Secondary | ICD-10-CM | POA: Diagnosis not present

## 2022-12-30 DIAGNOSIS — C7931 Secondary malignant neoplasm of brain: Secondary | ICD-10-CM | POA: Diagnosis not present

## 2022-12-30 DIAGNOSIS — N1831 Chronic kidney disease, stage 3a: Secondary | ICD-10-CM | POA: Diagnosis not present

## 2022-12-30 DIAGNOSIS — R29898 Other symptoms and signs involving the musculoskeletal system: Secondary | ICD-10-CM | POA: Diagnosis not present

## 2022-12-30 DIAGNOSIS — Z743 Need for continuous supervision: Secondary | ICD-10-CM | POA: Diagnosis not present

## 2022-12-30 DIAGNOSIS — N3001 Acute cystitis with hematuria: Secondary | ICD-10-CM | POA: Diagnosis not present

## 2022-12-30 DIAGNOSIS — I959 Hypotension, unspecified: Secondary | ICD-10-CM | POA: Diagnosis not present

## 2022-12-30 NOTE — Progress Notes (Signed)
  Care Coordination Note  12/30/2022 Name: [name] MRN: [id number] DOB: 03-24-1951  Amy Wilson is a 72 y.o. year old female who is a primary care patient of Hali Marry, MD and is actively engaged with the care management team. I reached out to Amy Wilson by phone today to assist with re-scheduling a follow up visit with the RN Case Manager  Follow up plan: We have been unable to make contact with the patient for follow up.   Julian Hy, [place] Direct Dial: [phone]

## 2022-12-31 ENCOUNTER — Telehealth: Payer: Self-pay | Admitting: Family Medicine

## 2022-12-31 NOTE — Telephone Encounter (Signed)
Received a phone call from [facility] is Kerrin Champagne phone number is [phone] she wanted to give an update on the patient she is under hospice care at home if you need updates please contact Kerrin Champagne .

## 2023-01-26 ENCOUNTER — Ambulatory Visit: Payer: Medicare HMO | Admitting: Family Medicine

## 2023-02-17 ENCOUNTER — Telehealth: Payer: Self-pay | Admitting: Family Medicine

## 2023-02-17 NOTE — Telephone Encounter (Signed)
Amy Wilson with [facility] called to let PCP know that this patient passed away on 03-02-23 and any question you can call [name] at [phone]

## 2023-02-17 NOTE — Telephone Encounter (Signed)
Called her daughter Larene Beach and gave her my condelences

## 2023-02-19 DEATH — deceased
